# Patient Record
Sex: Male | Born: 1960 | Race: White | Hispanic: No | Marital: Married | Smoking: Former smoker
Health system: Southern US, Community
[De-identification: ages and names within clinical notes are randomized; demographics above are authoritative.]

## PROBLEM LIST (undated history)

## (undated) DIAGNOSIS — E785 Hyperlipidemia, unspecified: Secondary | ICD-10-CM

## (undated) DIAGNOSIS — H269 Unspecified cataract: Secondary | ICD-10-CM

## (undated) DIAGNOSIS — I2510 Atherosclerotic heart disease of native coronary artery without angina pectoris: Secondary | ICD-10-CM

## (undated) DIAGNOSIS — R918 Other nonspecific abnormal finding of lung field: Secondary | ICD-10-CM

## (undated) DIAGNOSIS — M1990 Unspecified osteoarthritis, unspecified site: Secondary | ICD-10-CM

## (undated) DIAGNOSIS — R001 Bradycardia, unspecified: Secondary | ICD-10-CM

## (undated) DIAGNOSIS — T7840XA Allergy, unspecified, initial encounter: Secondary | ICD-10-CM

## (undated) DIAGNOSIS — K219 Gastro-esophageal reflux disease without esophagitis: Secondary | ICD-10-CM

## (undated) DIAGNOSIS — G709 Myoneural disorder, unspecified: Secondary | ICD-10-CM

## (undated) HISTORY — DX: Atherosclerotic heart disease of native coronary artery without angina pectoris: I25.10

## (undated) HISTORY — DX: Unspecified osteoarthritis, unspecified site: M19.90

## (undated) HISTORY — DX: Gastro-esophageal reflux disease without esophagitis: K21.9

## (undated) HISTORY — DX: Bradycardia, unspecified: R00.1

## (undated) HISTORY — DX: Myoneural disorder, unspecified: G70.9

## (undated) HISTORY — DX: Other nonspecific abnormal finding of lung field: R91.8

## (undated) HISTORY — DX: Hyperlipidemia, unspecified: E78.5

## (undated) HISTORY — DX: Unspecified cataract: H26.9

## (undated) HISTORY — DX: Allergy, unspecified, initial encounter: T78.40XA

## (undated) HISTORY — PX: VASECTOMY: SHX75

## (undated) HISTORY — PX: COLONOSCOPY: SHX174

---

## 1995-06-02 HISTORY — PX: LUMBAR LAMINECTOMY: SHX95

## 1997-11-16 ENCOUNTER — Ambulatory Visit (HOSPITAL_COMMUNITY): Admission: RE | Admit: 1997-11-16 | Discharge: 1997-11-16 | Payer: Self-pay | Admitting: *Deleted

## 1998-01-11 ENCOUNTER — Emergency Department (HOSPITAL_COMMUNITY): Admission: EM | Admit: 1998-01-11 | Discharge: 1998-01-11 | Payer: Self-pay | Admitting: Emergency Medicine

## 1999-07-09 ENCOUNTER — Encounter: Admission: RE | Admit: 1999-07-09 | Discharge: 1999-07-09 | Payer: Self-pay | Admitting: Internal Medicine

## 1999-07-09 ENCOUNTER — Encounter: Payer: Self-pay | Admitting: Internal Medicine

## 1999-11-15 ENCOUNTER — Emergency Department (HOSPITAL_COMMUNITY): Admission: EM | Admit: 1999-11-15 | Discharge: 1999-11-15 | Payer: Self-pay | Admitting: Emergency Medicine

## 2000-02-14 ENCOUNTER — Encounter: Payer: Self-pay | Admitting: Internal Medicine

## 2000-02-14 ENCOUNTER — Ambulatory Visit (HOSPITAL_COMMUNITY): Admission: RE | Admit: 2000-02-14 | Discharge: 2000-02-14 | Payer: Self-pay | Admitting: Internal Medicine

## 2000-03-22 ENCOUNTER — Encounter: Payer: Self-pay | Admitting: Gastroenterology

## 2000-03-22 ENCOUNTER — Encounter: Admission: RE | Admit: 2000-03-22 | Discharge: 2000-03-22 | Payer: Self-pay | Admitting: Gastroenterology

## 2000-05-24 ENCOUNTER — Ambulatory Visit (HOSPITAL_COMMUNITY): Admission: RE | Admit: 2000-05-24 | Discharge: 2000-05-24 | Payer: Self-pay | Admitting: Gastroenterology

## 2005-09-16 ENCOUNTER — Ambulatory Visit: Payer: Self-pay | Admitting: Family Medicine

## 2005-09-23 ENCOUNTER — Ambulatory Visit: Payer: Self-pay | Admitting: Family Medicine

## 2005-10-20 ENCOUNTER — Ambulatory Visit: Payer: Self-pay | Admitting: Gastroenterology

## 2005-10-27 ENCOUNTER — Encounter (INDEPENDENT_AMBULATORY_CARE_PROVIDER_SITE_OTHER): Payer: Self-pay | Admitting: *Deleted

## 2005-10-27 ENCOUNTER — Ambulatory Visit: Payer: Self-pay | Admitting: Gastroenterology

## 2005-10-27 DIAGNOSIS — D126 Benign neoplasm of colon, unspecified: Secondary | ICD-10-CM | POA: Insufficient documentation

## 2005-10-27 DIAGNOSIS — Z8719 Personal history of other diseases of the digestive system: Secondary | ICD-10-CM | POA: Insufficient documentation

## 2007-03-01 ENCOUNTER — Ambulatory Visit: Payer: Self-pay | Admitting: Family Medicine

## 2007-03-01 DIAGNOSIS — K222 Esophageal obstruction: Secondary | ICD-10-CM | POA: Insufficient documentation

## 2007-03-01 DIAGNOSIS — J309 Allergic rhinitis, unspecified: Secondary | ICD-10-CM | POA: Insufficient documentation

## 2007-06-02 HISTORY — PX: INGUINAL HERNIA REPAIR: SUR1180

## 2007-08-29 ENCOUNTER — Ambulatory Visit: Payer: Self-pay | Admitting: Gastroenterology

## 2007-08-29 LAB — CONVERTED CEMR LAB
ALT: 33 units/L (ref 0–53)
AST: 25 units/L (ref 0–37)
Albumin: 4.2 g/dL (ref 3.5–5.2)
Alkaline Phosphatase: 68 units/L (ref 39–117)
BUN: 8 mg/dL (ref 6–23)
Basophils Absolute: 0 10*3/uL (ref 0.0–0.1)
Basophils Relative: 0.3 % (ref 0.0–1.0)
Bilirubin, Direct: 0.1 mg/dL (ref 0.0–0.3)
CO2: 33 meq/L — ABNORMAL HIGH (ref 19–32)
Calcium: 9.3 mg/dL (ref 8.4–10.5)
Chloride: 105 meq/L (ref 96–112)
Creatinine, Ser: 0.9 mg/dL (ref 0.4–1.5)
Eosinophils Absolute: 0.2 10*3/uL (ref 0.0–0.7)
Eosinophils Relative: 2.6 % (ref 0.0–5.0)
GFR calc Af Amer: 116 mL/min
GFR calc non Af Amer: 96 mL/min
Glucose, Bld: 99 mg/dL (ref 70–99)
HCT: 43.6 % (ref 39.0–52.0)
Hemoglobin: 14.7 g/dL (ref 13.0–17.0)
Lymphocytes Relative: 36.9 % (ref 12.0–46.0)
MCHC: 33.6 g/dL (ref 30.0–36.0)
MCV: 90.4 fL (ref 78.0–100.0)
Monocytes Absolute: 0.5 10*3/uL (ref 0.1–1.0)
Monocytes Relative: 6.8 % (ref 3.0–12.0)
Neutro Abs: 3.6 10*3/uL (ref 1.4–7.7)
Neutrophils Relative %: 53.4 % (ref 43.0–77.0)
Platelets: 304 10*3/uL (ref 150–400)
Potassium: 4.1 meq/L (ref 3.5–5.1)
RBC: 4.82 M/uL (ref 4.22–5.81)
RDW: 11.7 % (ref 11.5–14.6)
Sodium: 142 meq/L (ref 135–145)
TSH: 1.7 microintl units/mL (ref 0.35–5.50)
Total Bilirubin: 0.8 mg/dL (ref 0.3–1.2)
Total Protein: 7.3 g/dL (ref 6.0–8.3)
WBC: 6.8 10*3/uL (ref 4.5–10.5)

## 2007-08-30 ENCOUNTER — Ambulatory Visit: Payer: Self-pay | Admitting: Internal Medicine

## 2007-08-31 ENCOUNTER — Ambulatory Visit: Payer: Self-pay

## 2007-08-31 ENCOUNTER — Encounter: Payer: Self-pay | Admitting: Internal Medicine

## 2007-09-26 ENCOUNTER — Ambulatory Visit: Payer: Self-pay | Admitting: Family Medicine

## 2007-09-26 LAB — CONVERTED CEMR LAB
Bilirubin Urine: NEGATIVE
Blood in Urine, dipstick: NEGATIVE
Glucose, Urine, Semiquant: NEGATIVE
Ketones, urine, test strip: NEGATIVE
Nitrite: NEGATIVE
Protein, U semiquant: NEGATIVE
Specific Gravity, Urine: 1.025
Urobilinogen, UA: 0.2
WBC Urine, dipstick: NEGATIVE
pH: 6.5

## 2007-09-27 ENCOUNTER — Ambulatory Visit: Payer: Self-pay | Admitting: Family Medicine

## 2007-09-27 LAB — CONVERTED CEMR LAB
ALT: 28 units/L (ref 0–53)
AST: 23 units/L (ref 0–37)
Albumin: 4.3 g/dL (ref 3.5–5.2)
Alkaline Phosphatase: 63 units/L (ref 39–117)
BUN: 12 mg/dL (ref 6–23)
Basophils Absolute: 0 10*3/uL (ref 0.0–0.1)
Basophils Relative: 0.3 % (ref 0.0–1.0)
Bilirubin, Direct: 0.1 mg/dL (ref 0.0–0.3)
CO2: 31 meq/L (ref 19–32)
Calcium: 9.5 mg/dL (ref 8.4–10.5)
Chloride: 108 meq/L (ref 96–112)
Cholesterol: 210 mg/dL (ref 0–200)
Creatinine, Ser: 0.9 mg/dL (ref 0.4–1.5)
Direct LDL: 138.8 mg/dL
Eosinophils Absolute: 0.3 10*3/uL (ref 0.0–0.7)
Eosinophils Relative: 4.8 % (ref 0.0–5.0)
GFR calc Af Amer: 116 mL/min
GFR calc non Af Amer: 96 mL/min
Glucose, Bld: 79 mg/dL (ref 70–99)
HCT: 46.2 % (ref 39.0–52.0)
HDL: 39.7 mg/dL (ref >39.0)
Hemoglobin: 15.4 g/dL (ref 13.0–17.0)
Lymphocytes Relative: 30.5 % (ref 12.0–46.0)
MCHC: 33.2 g/dL (ref 30.0–36.0)
MCV: 90.9 fL (ref 78.0–100.0)
Monocytes Absolute: 0.4 10*3/uL (ref 0.1–1.0)
Monocytes Relative: 7.3 % (ref 3.0–12.0)
Neutro Abs: 3.4 10*3/uL (ref 1.4–7.7)
Neutrophils Relative %: 57.1 % (ref 43.0–77.0)
Platelets: 211 10*3/uL (ref 150–400)
Potassium: 4.1 meq/L (ref 3.5–5.1)
RBC: 5.08 M/uL (ref 4.22–5.81)
RDW: 11.8 % (ref 11.5–14.6)
Sodium: 145 meq/L (ref 135–145)
TSH: 0.84 microintl units/mL (ref 0.35–5.50)
Total Bilirubin: 0.7 mg/dL (ref 0.3–1.2)
Total CHOL/HDL Ratio: 5.3
Total Protein: 7.4 g/dL (ref 6.0–8.3)
Triglycerides: 103 mg/dL (ref 0–149)
VLDL: 21 mg/dL (ref 0–40)
WBC: 5.9 10*3/uL (ref 4.5–10.5)

## 2007-10-03 ENCOUNTER — Ambulatory Visit: Payer: Self-pay | Admitting: Family Medicine

## 2007-10-03 DIAGNOSIS — J45909 Unspecified asthma, uncomplicated: Secondary | ICD-10-CM | POA: Insufficient documentation

## 2007-10-03 DIAGNOSIS — K219 Gastro-esophageal reflux disease without esophagitis: Secondary | ICD-10-CM | POA: Insufficient documentation

## 2007-10-03 DIAGNOSIS — K439 Ventral hernia without obstruction or gangrene: Secondary | ICD-10-CM | POA: Insufficient documentation

## 2007-10-03 DIAGNOSIS — M545 Low back pain, unspecified: Secondary | ICD-10-CM | POA: Insufficient documentation

## 2007-10-11 ENCOUNTER — Ambulatory Visit: Payer: Self-pay | Admitting: Gastroenterology

## 2007-10-14 ENCOUNTER — Telehealth (INDEPENDENT_AMBULATORY_CARE_PROVIDER_SITE_OTHER): Payer: Self-pay | Admitting: *Deleted

## 2007-11-02 ENCOUNTER — Telehealth: Payer: Self-pay | Admitting: Gastroenterology

## 2007-12-01 ENCOUNTER — Encounter: Payer: Self-pay | Admitting: Gastroenterology

## 2008-01-05 ENCOUNTER — Telehealth: Payer: Self-pay | Admitting: Gastroenterology

## 2008-04-20 ENCOUNTER — Ambulatory Visit (HOSPITAL_BASED_OUTPATIENT_CLINIC_OR_DEPARTMENT_OTHER): Admission: RE | Admit: 2008-04-20 | Discharge: 2008-04-20 | Payer: Self-pay | Admitting: Surgery

## 2008-06-05 ENCOUNTER — Encounter: Payer: Self-pay | Admitting: Gastroenterology

## 2009-03-12 ENCOUNTER — Telehealth: Payer: Self-pay | Admitting: Family Medicine

## 2009-03-27 ENCOUNTER — Encounter: Payer: Self-pay | Admitting: Internal Medicine

## 2009-04-04 ENCOUNTER — Ambulatory Visit: Payer: Self-pay | Admitting: Internal Medicine

## 2009-04-04 DIAGNOSIS — R0789 Other chest pain: Secondary | ICD-10-CM | POA: Insufficient documentation

## 2009-04-19 ENCOUNTER — Ambulatory Visit: Payer: Self-pay | Admitting: Cardiology

## 2009-04-19 ENCOUNTER — Ambulatory Visit (HOSPITAL_COMMUNITY): Admission: RE | Admit: 2009-04-19 | Discharge: 2009-04-19 | Payer: Self-pay | Admitting: Internal Medicine

## 2009-04-19 ENCOUNTER — Ambulatory Visit: Payer: Self-pay

## 2009-04-19 ENCOUNTER — Ambulatory Visit: Payer: Self-pay | Admitting: Internal Medicine

## 2009-04-19 ENCOUNTER — Encounter: Payer: Self-pay | Admitting: Internal Medicine

## 2009-04-19 DIAGNOSIS — R002 Palpitations: Secondary | ICD-10-CM | POA: Insufficient documentation

## 2009-04-23 LAB — CONVERTED CEMR LAB: TSH: 1.22 microintl units/mL (ref 0.35–5.50)

## 2010-01-30 ENCOUNTER — Telehealth: Payer: Self-pay | Admitting: Internal Medicine

## 2010-01-30 HISTORY — PX: NECK SURGERY: SHX720

## 2010-07-01 NOTE — Progress Notes (Signed)
Summary: RX CALLED IN  Phone Note Call from Patient Call back at Home Phone [phone]   Caller: Patient Call For: Kadeisha Betsch Reason for Call: Talk to Nurse Summary of Call: WAS GIVENM SAMPLES OF NEXIUM TO TRY AND IF THEY WORK OT CALL us BACK TO GET AND RX .Marland KitchenMarland KitchenPT STATES THAT THEY WORK AND WOULD LIKE AN RX CALLED IN TO HIS PHARMACY [place]  Initial call taken by: Tawni Levy,  November 02, 2007 10:40 AM  Follow-up for Phone Call        rx called in Follow-up by: Chales Abrahams CMA,  November 02, 2007 10:54 AM    New/Updated Medications: NEXIUM 40 MG  CPDR (ESOMEPRAZOLE MAGNESIUM) 1 once daily 20-30 min before breakfast   Prescriptions: NEXIUM 40 MG  CPDR (ESOMEPRAZOLE MAGNESIUM) 1 once daily 20-30 min before breakfast  #30 x 3   Entered by:   Chales Abrahams CMA   Authorized by:   Rachael Fee MD   Signed by:   Chales Abrahams CMA on 11/02/2007   Method used:   Electronically sent to ...       [facility]*       [address]       [place], [place]  [address]       Ph: [phone]       Fax: [phone]   RxID:   [id number]

## 2010-07-01 NOTE — Progress Notes (Signed)
Summary: refill  Phone Note Call from Patient Call back at [phone]   Caller: Patient Call For: Fenix Ruppe Reason for Call: Talk to Nurse Details for Reason: refill Summary of Call: [facility] states they have NOT received any electronic request on refill for nexium -advised pt it was sent on 8-4  Follow-up for Phone Call        pharmacy called and notified of rx refill electronically sent on 01/03/08 Follow-up by: Chales Abrahams CMA,  January 06, 2008 8:21 AM

## 2010-07-01 NOTE — Assessment & Plan Note (Signed)
Summary: RENEW MEDS/CCM   Vital Signs:  Patient Profile:   50 Years Old Male Weight:      184 pounds (83.64 kg) Temp:     98.2 degrees F (36.78 degrees C) oral BP sitting:   140 / 78  (right arm)  Pt. in pain?   no  Vitals Entered By: Arcola Jansky, RN (March 01, 2007 10:06 AM)                  Chief Complaint:  "RF MEDS, EYE STRAIN , FEELING RUN DOWN, and CHRONIC BACK PAIN".  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  As his problem using the fall.  A. congestion, fatigue tired, no energy.  He has no other symptoms.  We saw her about a year ago.  He had trouble swallowing.  It turned out on endoscopy by Dr. Christella Hartigan.  He had a stricture lower esophageal sphincter.  There was dilated.  He's not been on a PPI since then.  He says swallowing mechanism is normal.  Acute Visit History:      He denies abdominal pain, chest pain, constipation, cough, diarrhea, earache, eye symptoms, fever, genitourinary symptoms, headache, musculoskeletal symptoms, nasal discharge, nausea, rash, sinus problems, sore throat, and vomiting.         Current Allergies: ! * LATEX    Risk Factors:  Tobacco use:  never Alcohol use:  no   Review of Systems      See HPI   Physical Exam  General:     Well-developed,well-nourished,in no acute distress; alert,appropriate and cooperative throughout examination Head:     Normocephalic and atraumatic without obvious abnormalities. No apparent alopecia or balding. Eyes:     No corneal or conjunctival inflammation noted. EOMI. Perrla. Funduscopic exam benign, without hemorrhages, exudates or papilledema. Vision grossly normal. Ears:     External ear exam shows no significant lesions or deformities.  Otoscopic examination reveals clear canals, tympanic membranes are intact bilaterally without bulging, retraction, inflammation or discharge. Hearing is grossly normal  bilaterally. Nose:     External nasal examination shows no deformity or inflammation. Nasal mucosa are pink and moist without lesions or exudates. Mouth:     Oral mucosa and oropharynx without lesions or exudates.  Teeth in good repair. Neck:     No deformities, masses, or tenderness noted. Heart:     120/80    Impression & Recommendations:  Problem # 1:  RHINITIS, ALLERGIC NOS (ICD-477.9) Assessment: Deteriorated  His updated medication list for this problem includes:    Flonase 50 Mcg/act Susp (Fluticasone propionate)    Beconase Aq 42 Mcg/spray Susp (Beclomethasone diprop monohyd)   Problem # 2:  ESOPHAGEAL STRICTURE (ICD-530.3) Assessment: Unchanged  Complete Medication List: 1)  Intal  2)  Flonase 50 Mcg/act Susp (Fluticasone propionate) 3)  Hydrocodone-acetaminophen 5-500 Mg Tabs (Hydrocodone-acetaminophen) 4)  Beconase Aq 42 Mcg/spray Susp (Beclomethasone diprop monohyd) 5)  Albuterol 90 Mcg/act Aers (Albuterol) 6)  Ventolin Hfa 108 (90 Base) Mcg/act Aers (Albuterol sulfate)   Patient Instructions: 1)  I would take the over-the-counter 10-mg Claritin tablet in the morning to help with your allergy symptoms.  If this is not work after two to 3 weeks with Z. the steroid nasal spray.  I would also take a 20-mg Prilosec tablet every morning.  Because of your history of esophageal reflux and the stricture that had to be dilated in 2007.  Be  conscious of her swallowing mechanism.  It did begin to have problems in need to call Dr. Christella Hartigan immediately for reevaluation.  Set up in April 2009 for physical exam. 2)  cbc, lipid panel, u/a ,tsh level, liver fcn panel,bmet, and psa prior to next visit (v70.0)     ]

## 2010-07-01 NOTE — Consult Note (Signed)
Summary: [facility]   Imported By: Esmeralda Links D'jimraou 12/29/2007 13:25:21  _____________________________________________________________________  External Attachment:    Type:   Image     Comment:   External Document

## 2010-07-01 NOTE — Assessment & Plan Note (Signed)
Summary: cpx--smm   Vital Signs:  Patient Profile:   50 Years Old Male Height:     70 inches Weight:      188 pounds Temp:     98.3 degrees F oral BP sitting:   124 / 86  (left arm)  Pt. in pain?   no  Vitals Entered By: Doristine Devoid (Oct 03, 2007 10:38 AM)                  Chief Complaint:  cpx.  History of Present Illness: Jake [name] is a 50 year old, married male, who comes in today for evaluation of allergic rhinitis, asthma, reflux, esophagitis,  The allergic rhinitis is treated with over-the-counter antihistamines.  And Beconase HQ nasal spray.  The asthma is a chronic condition and he should be using Flovent one puff b.i.d.  Advised for him to do this on a daily basis.  The reflux esophagitis was evaluated by Dr. Christella Hartigan.  He advised cardiac eval.  He went to see Dr. Sharrell Ku, who did a complete evaluation, and it was all normal.  He is going back to see Dr. Christella Hartigan for reflexes isolation.  He takes Prilosec 20 mg two tablets before breakfast.  Advise split the dose and take one before breakfast and one before his p.m. meal.  He also complains of a slight ventral hernia.  He says his been present for quite, awhile, it's asymptomatic.    Current Allergies: ! * LATEX  Past Medical History:    Reviewed history and no changes required:       Allergic rhinitis       Asthma       GERD       Low back pain   Family History:    Reviewed history and no changes required:       Family History of Alcoholism/Addiction       Family History of Asthma       Family History Depression  Social History:    Reviewed history and no changes required:       Occupation:       Married       Never Smoked       Alcohol use-no       Drug use-no       Regular exercise-yes   Risk Factors:  Tobacco use:  never Drug use:  no Alcohol use:  no Exercise:  yes   Review of Systems      See HPI   Physical Exam  General:     Well-developed,well-nourished,in no acute distress;  alert,appropriate and cooperative throughout examination Head:     Normocephalic and atraumatic without obvious abnormalities. No apparent alopecia or balding. Eyes:     No corneal or conjunctival inflammation noted. EOMI. Perrla. Funduscopic exam benign, without hemorrhages, exudates or papilledema. Vision grossly normal. Ears:     External ear exam shows no significant lesions or deformities.  Otoscopic examination reveals clear canals, tympanic membranes are intact bilaterally without bulging, retraction, inflammation or discharge. Hearing is grossly normal bilaterally. Nose:     External nasal examination shows no deformity or inflammation. Nasal mucosa are pink and moist without lesions or exudates. Mouth:     Oral mucosa and oropharynx without lesions or exudates.  Teeth in good repair. Neck:     No deformities, masses, or tenderness noted. Chest Wall:     No deformities, masses, tenderness or gynecomastia noted. Breasts:     No masses or gynecomastia noted Lungs:  Normal respiratory effort, chest expands symmetrically. Lungs are clear to auscultation, no crackles or wheezes. Heart:     Normal rate and regular rhythm. S1 and S2 normal without gallop, murmur, click, rub or other extra sounds. Abdomen:     there is a 3-inch ventral hernia from the lower sternum to halfway to the umbilicus.  Otherwise abdominal exam negative Rectal:     No external abnormalities noted. Normal sphincter tone. No rectal masses or tenderness. Genitalia:     Testes bilaterally descended without nodularity, tenderness or masses. No scrotal masses or lesions. No penis lesions or urethral discharge. Prostate:     Prostate gland firm and smooth, no enlargement, nodularity, tenderness, mass, asymmetry or induration. Msk:     No deformity or scoliosis noted of thoracic or lumbar spine.   Pulses:     R and L carotid,radial,femoral,dorsalis pedis and posterior tibial pulses are full and equal  bilaterally Extremities:     No clubbing, cyanosis, edema, or deformity noted with normal full range of motion of all joints.   Neurologic:     No cranial nerve deficits noted. Station and gait are normal. Plantar reflexes are down-going bilaterally. DTRs are symmetrical throughout. Sensory, motor and coordinative functions appear intact. Skin:     Intact without suspicious lesions or rashes Cervical Nodes:     No lymphadenopathy noted Axillary Nodes:     No palpable lymphadenopathy Inguinal Nodes:     No significant adenopathy Psych:     Cognition and judgment appear intact. Alert and cooperative with normal attention span and concentration. No apparent delusions, illusions, hallucinations    Impression & Recommendations:  Problem # 1:  ALLERGIC RHINITIS (ICD-477.9) Assessment: Improved  The following medications were removed from the medication list:    Flonase 50 Mcg/act Susp (Fluticasone propionate)  His updated medication list for this problem includes:    Beconase Aq 42 Mcg/spray Susp (Beclomethasone diprop monohyd) .Marland Kitchen... As needed   Problem # 2:  ASTHMA (ICD-493.90) Assessment: Improved  The following medications were removed from the medication list:    Albuterol 90 Mcg/act Aers (Albuterol)  His updated medication list for this problem includes:    Ventolin Hfa 108 (90 Base) Mcg/act Aers (Albuterol sulfate) .Marland Kitchen... As needed    Flovent Hfa 44 Mcg/act Aero (Fluticasone propionate  hfa) .Marland Kitchen... 1 p two times a day   Problem # 3:  GERD (ICD-530.81) Assessment: Unchanged  Problem # 4:  VENTRAL HERNIA, SMALL (ICD-553.20) Assessment: New  Complete Medication List: 1)  Intal  .... As needed 2)  Hydrocodone-acetaminophen 5-500 Mg Tabs (Hydrocodone-acetaminophen) 3)  Beconase Aq 42 Mcg/spray Susp (Beclomethasone diprop monohyd) .... As needed 4)  Ventolin Hfa 108 (90 Base) Mcg/act Aers (Albuterol sulfate) .... As needed 5)  Flovent Hfa 44 Mcg/act Aero (Fluticasone  propionate  hfa) .Marland Kitchen.. 1 p two times a day   Patient Instructions: 1)  Please schedule a follow-up appointment in 1 year. 2)  Take an Aspirin every day 3)  I would take one puff of the Flovent twice a day.  All year round.    Prescriptions: FLOVENT HFA 44 MCG/ACT  AERO (FLUTICASONE PROPIONATE  HFA) 1 p two times a day  #3 x 4   Entered and Authorized by:   Roderick Pee MD   Signed by:   Roderick Pee MD on 10/03/2007   Method used:   Electronically sent to ...       [facility]*  [address]       [place], [place]  [address]       Ph: [phone] or [phone]       Fax: [phone]   RxID:   [id number] VENTOLIN HFA 108 (90 BASE) MCG/ACT  AERS (ALBUTEROL SULFATE) as needed  #2 x 1   Entered and Authorized by:   Roderick Pee MD   Signed by:   Roderick Pee MD on 10/03/2007   Method used:   Electronically sent to ...       [facility]*       [address]       [place], [place]  [address]       Ph: [phone] or [phone]       Fax: [phone]   RxID:   [id number] BECONASE AQ 42 MCG/SPRAY  SUSP (BECLOMETHASONE DIPROP MONOHYD) as needed  #3 x 4   Entered and Authorized by:   Roderick Pee MD   Signed by:   Roderick Pee MD on 10/03/2007   Method used:   Electronically sent to ...       [facility]*       [address]       [place], [place]  [address]       Ph: [phone] or [phone]       Fax: [phone]   RxID:   [id number] INTAL as needed  #2 x 3   Entered and Authorized by:   Roderick Pee MD   Signed by:   Roderick Pee MD on 10/03/2007   Method used:   Electronically sent to ...       [facility]*       [address]       [place], [place]  [address]       Ph: [phone] or [phone]       Fax: [phone]   RxID:   [id number]  ]

## 2010-07-01 NOTE — Progress Notes (Signed)
Summary: pt req refill of Flovent  Phone Note Call from Patient Call back at [facility] Phone [phone]   Caller: Patient Summary of Call: Pt called and is wanted to know status of refill of Flovent. Pt needs this done today.  Initial call taken by: Lucy Antigua,  March 12, 2009 11:24 AM    Prescriptions: FLOVENT HFA 44 MCG/ACT  AERO (FLUTICASONE PROPIONATE  HFA) 1 p two times a day  #1 x 0   Entered and Authorized by:   Roderick Pee MD   Signed by:   Roderick Pee MD on 03/12/2009   Method used:   Electronically to        [facility]* (retail)       [address]       [place], [place]  [address]       Ph: [phone]       Fax: [phone]   RxID:   [id number] VENTOLIN HFA 108 (90 BASE) MCG/ACT  AERS (ALBUTEROL SULFATE) as needed  #1 x 0   Entered and Authorized by:   Roderick Pee MD   Signed by:   Roderick Pee MD on 03/12/2009   Method used:   Electronically to        [facility]* (retail)       [address]       [place], [place]  [address]       Ph: [phone]       Fax: [phone]   RxID:   [id number]

## 2010-07-01 NOTE — Procedures (Signed)
Summary: Gastroenterology EGD  Gastroenterology EGD   Imported By: Thereasa Solo 10/08/2007 11:16:39  _____________________________________________________________________  External Attachment:    Type:   Image     Comment:   External Document

## 2010-07-01 NOTE — Assessment & Plan Note (Signed)
History of Present Illness Visit Type: follow up Primary GI MD: Rob Bunting MD Primary MD: Kelle Darting Chief Complaint: epigastric discomfort, hernia History of Present Illness:  very pleasant 50 year old man whom I last saw 6 or 8 weeks ago. She was having intermittent chest and epigastric discomfort. I want to make sure that this was not cardiac cause and so I sent him to cardiology for evaluation. He has stress tests and other testing done tells me that he has clear bill of health from his cardiologist. He change the way taking his Prilosec so that he takes one pill 20- 30 minutes before breakfast and one pill 20-30 minutes prior to dinner. He does on this regimen he still bothered by intermittent substernal spasms and mild pyrosis. He has no dysphasia.  Complained of a new discomfort today. Specifically he has pain in his midline abdomen with Valsalva. He was told he has a ventral hernia. This is not a severe pain when it occurs but it is bothersome enough and keeps him from snowboarding sometimes.   GI Review of Systems    Reports abdominal pain and  acid reflux.     Denies dysphagia with liquids, dysphagia with solids, and  weight loss.         Prior Medications Reviewed Using: Patient Recall  Updated Prior Medication List: * INTAL as needed HYDROCODONE-ACETAMINOPHEN 5-500 MG  TABS (HYDROCODONE-ACETAMINOPHEN)  BECONASE AQ 42 MCG/SPRAY  SUSP (BECLOMETHASONE DIPROP MONOHYD) as needed VENTOLIN HFA 108 (90 BASE) MCG/ACT  AERS (ALBUTEROL SULFATE) as needed FLOVENT HFA 44 MCG/ACT  AERO (FLUTICASONE PROPIONATE  HFA) 1 p two times a day NABUMETONE 500 MG  TABS (NABUMETONE) prn PRILOSEC OTC 20 MG  TBEC (OMEPRAZOLE MAGNESIUM) 1 each day 30 minutes before meal bid  Current Allergies: ! * LATEX      Vital Signs:  Patient Profile:   50 Years Old Male CC:      Consult egd/ cardiac patient Height:     70 inches Weight:      188 pounds BMI:     27.07 Pulse rate:   68 /  minute BP sitting:   120 / 80  (left arm)                  Physical Exam  Constitutional: generally well appearing Psychiatric: alert and oriented times 3 Abdomen: soft, non-tender, non-distended, normal bowel sounds, slight midline bulge with valsalva     Impression & Recommendations:  Problem # 1:  VENTRAL HERNIA, SMALL (ICD-553.20) small ventral hernia but seems to be symptomatic. I'll refer him to Dr. Wenda Low at [facility] surgery for evaluation and recommendation.  Problem # 2:  GERD (ICD-530.81) his intermittent esophageal spasms may be from GERD. He did have a Schatzki's ring seen in 2007 but he has no dysphasia and so I do not think this is symptomatic again. Looking at the pictures from his EGD previously I wonder if this was actually peptic stricturing rather than a thick Schatzki's ring. For now recommended that he stop taking the over-the-counter Prilosec and instead begin taking Nexium 20 to 30s prior to his breakfast meal on a daily basis. He will call my office in 4-6 weeks to let me know if it is working effectively if it isl we will call in a prescription for the Nexium.  Medications Added to Medication List This Visit: 1)  Nabumetone 500 Mg Tabs (Nabumetone) .... Prn 2)  Prilosec Otc 20 Mg Tbec (Omeprazole magnesium) .Marland KitchenMarland KitchenMarland Kitchen 1  each day 30 minutes before meal bid   Patient Instructions: 1)  Stop Prilosec.  Start Nexium 20-30 minutes prior to meal. 2)  Referral to [facility]    ]  Appended Document: update orders         Current Allergies: ! * LATEX             ]

## 2010-07-01 NOTE — Progress Notes (Signed)
Summary: pt needs some information for surgery  Phone Note Call from Patient Call back at [phone]   Caller: Patient Reason for Call: Talk to Nurse, Talk to Doctor Summary of Call: pt is having Neuro surgery in about 3wks and needs some results of some test he had done in order to finish his medical history Initial call taken by: Omer Jack,  January 30, 2010 2:07 PM  Follow-up for Phone Call        mailed stees echo , office note, EKG and echo from 2009 to pt home address Dennis Bast, RN, BSN  January 30, 2010 2:32 PM

## 2010-07-01 NOTE — Progress Notes (Signed)
  Phone Note Outgoing Call Call back at [facility] [phone] Call back at Work Phone [phone]   Call placed by: Chales Abrahams CMA,  Oct 14, 2007 9:44 AM Call placed to: Patient Action Taken: Appt scheduled Details for Reason: Need to inform pt of [facility] appt with Dr Daphine Deutscher 11:05 am pt also needs to arrive 20 minutes early. Summary of Call: Inform pt of [facility] appt  Follow-up for Phone Call        pt returned call and informed of appt date and time and to arrive 20 minutes early. Pt verbalized understanding. Follow-up by: Chales Abrahams CMA,  Oct 14, 2007 10:08 AM

## 2010-07-01 NOTE — Miscellaneous (Signed)
Summary: nexium refill  Clinical Lists Changes  Medications: Changed medication from NEXIUM 40 MG  CPDR (ESOMEPRAZOLE MAGNESIUM) 1 once daily 20-30 min before breakfast to NEXIUM 40 MG  CPDR (ESOMEPRAZOLE MAGNESIUM) 1 once daily 20-30 min before breakfast - Signed Rx of NEXIUM 40 MG  CPDR (ESOMEPRAZOLE MAGNESIUM) 1 once daily 20-30 min before breakfast;  #30 x 3;  Signed;  Entered by: Chales Abrahams CMA;  Authorized by: Rachael Fee MD;  Method used: Electronically to [facility]*, [address], [place], [place]  [address], Ph: [phone], Fax: [phone]    Prescriptions: NEXIUM 40 MG  CPDR (ESOMEPRAZOLE MAGNESIUM) 1 once daily 20-30 min before breakfast  #30 x 3   Entered by:   Chales Abrahams CMA   Authorized by:   Rachael Fee MD   Signed by:   Chales Abrahams CMA on 06/05/2008   Method used:   Electronically to        [facility]* (retail)       [address]       [place], [place]  [address]       Ph: [phone]       Fax: [phone]   RxID:   [id number]

## 2010-07-01 NOTE — Assessment & Plan Note (Signed)
Summary: f1y per pt call/lg   Primary Provider:  Kelle Darting  CC:  CP; SOB; fatigue.  History of Present Illness: Mr. Offerdahl returns today for followup.  I saw him initially almost two years ago and at that time he had c/p and had a regular stress test and a 2-D echo.  He did well but over the last month has had chest pressure and fatigue.  He was diagnosed with pneumonia vs bronchitis at [facility] urgent care.  He has been on anti-biotics.  He denies syncope and peripheral edema.  Current Medications (verified): 1)  Intal .... As Needed 2)  Beconase Aq 42 Mcg/spray  Susp (Beclomethasone Diprop Monohyd) .... As Needed 3)  Ventolin Hfa 108 (90 Base) Mcg/act  Aers (Albuterol Sulfate) .... As Needed 4)  Flovent Hfa 44 Mcg/act  Aero (Fluticasone Propionate  Hfa) .Marland Kitchen.. 1 P Two Times A Day 5)  Nexium 40 Mg  Cpdr (Esomeprazole Magnesium) .Marland Kitchen.. 1 Once Daily 20-30 Min Before Breakfast  Allergies (verified): 1)  ! * Latex  Past History:  Past Medical History: Last updated: 10/03/2007 Allergic rhinitis Asthma GERD Low back pain  Past Surgical History: Last updated: 10/08/2007 LUMBAR LAMINECTOMY (1997)  Review of Systems       The patient complains of chest pain.  The patient denies syncope, dyspnea on exertion, and peripheral edema.    Vital Signs:  Patient profile:   50 year old male Height:      70 inches Weight:      184 pounds BMI:     26.50 Pulse rate:   70 / minute BP sitting:   114 / 86  (left arm) Cuff size:   regular  Vitals Entered By: Hardin Negus, RMA (April 04, 2009 4:27 PM)  Physical Exam  General:  Well developed, well nourished, in no acute distress.  HEENT: normal Neck: supple. No JVD. Carotids 2+ bilaterally no bruits Cor: RRR no rubs, gallops or murmur Lungs: CTA Ab: soft, nontender. nondistended. No HSM. Good bowel sounds Ext: warm. no cyanosis, clubbing or edema Neuro: alert and oriented. Grossly nonfocal. affect pleasant    Impression &  Recommendations:  Problem # 1:  CHEST DISCOMFORT (ICD-786.59) The etiology of his symptoms is unclear. In addition to chest pressure, he's had this generalized fatigue and weakness.  I TSH will be drawn.  His CBC does not suggest any anemia with a Hgb of 15.  While a cardiac cath is an option, I think it is unlikely that he will have obstructive disease and a stress echo will give [place] some functional information. Orders: Stress Echo (Stress Echo)  Problem # 2:  CHEST DISCOMFORT (YNW-295.62)  Patient Instructions: 1)  Your physician recommends that you schedule a follow-up appointment depends on results of stress echo 2)  Your physician has requested that you have a stress echocardiogram. For further information please visit [email or web address]  Please follow instruction sheet as given. 3)  Your physician recommends that you return for lab work sameday as echo will need a TSH

## 2010-07-01 NOTE — Procedures (Signed)
Summary: Gastroenterology [facility]  Gastroenterology [facility]   Imported By: Thereasa Solo 10/08/2007 11:15:51  _____________________________________________________________________  External Attachment:    Type:   Image     Comment:   External Document

## 2010-07-04 ENCOUNTER — Other Ambulatory Visit: Payer: Self-pay | Admitting: Orthopedic Surgery

## 2010-07-04 ENCOUNTER — Ambulatory Visit (HOSPITAL_BASED_OUTPATIENT_CLINIC_OR_DEPARTMENT_OTHER)
Admission: RE | Admit: 2010-07-04 | Discharge: 2010-07-04 | Disposition: A | Discharge status: Home / Self Care | Payer: BC Managed Care – PPO | Source: Ambulatory Visit | Attending: Orthopedic Surgery | Admitting: Orthopedic Surgery

## 2010-07-04 DIAGNOSIS — B079 Viral wart, unspecified: Secondary | ICD-10-CM | POA: Insufficient documentation

## 2010-07-17 NOTE — Op Note (Signed)
Jake [name], SHIPPER NO.:  [id number]  MEDICAL RECORD NO.:  [id number]          PATIENT TYPE:  LOCATION:                                 FACILITY:  PHYSICIAN:  Katy Fitch. Dywane Peruski, M.D.      DATE OF BIRTH:  DATE OF PROCEDURE:  07/04/2010 DATE OF DISCHARGE:                              OPERATIVE REPORT   PREOPERATIVE DIAGNOSES:  Uncomfortable verrucous mass, left ring finger, proximal interphalangeal flexion creases, volar surface of the glabrous skin measuring 5 x 4 mm with failed prior medical management using salicylic acid.  POSTOPERATIVE DIAGNOSES:  Uncomfortable verrucous mass, left ring finger, proximal interphalangeal flexion creases, volar surface of the glabrous skin measuring 5 x 4 mm with failed prior medical management using salicylic acid.  OPERATION:  Full-thickness skin biopsy of verrucous mass, right ring finger for diagnosis and hopeful resolution.  The excision was also augmented by a subcutaneous infiltration of 2% lidocaine in an effort to sensitize the skin in this region and decrease the risk of recurrence.  SURGEON:  Katy Fitch. Ymani Porcher, MD  ASSISTANT:  Marveen Reeks Dasnoit, PA-C  ANESTHESIA:  2% lidocaine, metacarpal head level block and subverrucous infiltration.  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  He developed a painful verrucous mass on the volar aspect of his left ring finger PIP flexion creases.  He is treated this with compound W and other salicylic acid preparations without success.  He was referred for definitive excision.  We will biopsy this to be certain this is a verrucous lesion. Mr. 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  He was advised that we would accomplish surgery in a latex-free environment.  After informed consent, he was brought to the operating room at  this time.  PROCEDURE IN DETAILS:  Fenton Candee. Kann was brought to the minor operating room of [facility], placed supine position upon the operating room table.  After Betadine prep and informed consent, 2% lidocaine was infiltrated at metacarpal head level to obtain a digital block.  The left hand and arm were then prepped with Betadine soap solution, sterilely draped with sterile towels.  The left ring finger was exsanguinated with a gauze wrap and a 1/2-inch Penrose drain was placed in the base of the proximal phalanx as a digital tourniquet.  A routine surgical time-out was accomplished.  Procedure commenced with an elliptical excision of skin with a 1 to 1.5-mm margin of normal- appearing skin.  This was taken full-thickness down to the level of subcutaneous fat and veins.  Specimen was placed in formalin and passed off for pathologic evaluation.  The wound was then repaired with simple suture of 5-0 nylon x2.  Lidocaine 2% was infiltrated at the base of the wound and the skin margins, pierced with a needle several times in an effort to stimulate as immune response.  This has been demonstrated in our literature to improve the resolution of wart-like lesions.  The finger was then dressed with Lyda Perone, sterile  gauze and Coban.  Mr. Vaden was provided prescription for tramadol 50 mg 1 p.o. q.4-6 h. p.r.n. pain 20 tablets without refill for postop analgesia.  He will elevate his hand for the next 24-48 hours.  He will see me back for followup in a week for suture removal.  He will take off his dressing and begin using Band-Aid in 48 hours.     Katy Fitch Lavra Imler, M.D.     RVS/MEDQ  D:  07/04/2010  T:  07/05/2010  Job:  [id number]  Electronically Signed by Josephine Igo M.D. on 07/17/2010 08:42:09 AM

## 2010-08-08 ENCOUNTER — Other Ambulatory Visit: Payer: BC Managed Care – PPO

## 2010-08-08 DIAGNOSIS — Z0000 Encounter for general adult medical examination without abnormal findings: Secondary | ICD-10-CM

## 2010-08-08 LAB — HEPATIC FUNCTION PANEL
ALT: 19 U/L (ref 0–53)
AST: 13 U/L (ref 0–37)
Albumin: 4.3 g/dL (ref 3.5–5.2)
Alkaline Phosphatase: 63 U/L (ref 39–117)
Bilirubin, Direct: 0.2 mg/dL (ref 0.0–0.3)
Total Bilirubin: 0.6 mg/dL (ref 0.3–1.2)
Total Protein: 7 g/dL (ref 6.0–8.3)

## 2010-08-08 LAB — CBC WITH DIFFERENTIAL/PLATELET
Basophils Absolute: 0 10*3/uL (ref 0.0–0.1)
Basophils Relative: 0.1 % (ref 0.0–3.0)
Eosinophils Absolute: 0 10*3/uL (ref 0.0–0.7)
Eosinophils Relative: 0 % (ref 0.0–5.0)
HCT: 41.9 % (ref 39.0–52.0)
Hemoglobin: 14.4 g/dL (ref 13.0–17.0)
Lymphocytes Relative: 16.2 % (ref 12.0–46.0)
Lymphs Abs: 1.9 10*3/uL (ref 0.7–4.0)
MCHC: 34.2 g/dL (ref 30.0–36.0)
MCV: 92.2 fl (ref 78.0–100.0)
Monocytes Absolute: 0.7 10*3/uL (ref 0.1–1.0)
Monocytes Relative: 6.1 % (ref 3.0–12.0)
Neutro Abs: 9.2 10*3/uL — ABNORMAL HIGH (ref 1.4–7.7)
Neutrophils Relative %: 77.6 % — ABNORMAL HIGH (ref 43.0–77.0)
Platelets: 321 10*3/uL (ref 150.0–400.0)
RBC: 4.54 Mil/uL (ref 4.22–5.81)
RDW: 12.4 % (ref 11.5–14.6)
WBC: 11.8 10*3/uL — ABNORMAL HIGH (ref 4.5–10.5)

## 2010-08-08 LAB — TSH: TSH: 0.46 u[IU]/mL (ref 0.35–5.50)

## 2010-08-08 LAB — LIPID PANEL
Cholesterol: 222 mg/dL — ABNORMAL HIGH (ref 0–200)
HDL: 55.8 mg/dL (ref >39.00)
Total CHOL/HDL Ratio: 4
Triglycerides: 103 mg/dL (ref 0.0–149.0)
VLDL: 20.6 mg/dL (ref 0.0–40.0)

## 2010-08-08 LAB — POCT URINALYSIS DIPSTICK
Bilirubin, UA: NEGATIVE
Blood, UA: NEGATIVE
Glucose, UA: NEGATIVE
Ketones, UA: NEGATIVE
Leukocytes, UA: NEGATIVE
Nitrite, UA: NEGATIVE
Spec Grav, UA: 1.025
Urobilinogen, UA: 0.2
pH, UA: 7

## 2010-08-08 LAB — BASIC METABOLIC PANEL
BUN: 13 mg/dL (ref 6–23)
CO2: 29 mEq/L (ref 19–32)
Calcium: 9.1 mg/dL (ref 8.4–10.5)
Chloride: 102 mEq/L (ref 96–112)
Creatinine, Ser: 0.7 mg/dL (ref 0.4–1.5)
GFR: 122.64 mL/min (ref >60.00)
Glucose, Bld: 93 mg/dL (ref 70–99)
Potassium: 4.3 mEq/L (ref 3.5–5.1)
Sodium: 139 mEq/L (ref 135–145)

## 2010-08-08 LAB — LDL CHOLESTEROL, DIRECT: Direct LDL: 147 mg/dL

## 2010-08-08 LAB — PSA: PSA: 0.58 ng/mL (ref 0.10–4.00)

## 2010-08-18 ENCOUNTER — Encounter: Payer: Self-pay | Admitting: Family Medicine

## 2010-08-19 ENCOUNTER — Encounter: Payer: Self-pay | Admitting: Family Medicine

## 2010-08-19 ENCOUNTER — Other Ambulatory Visit: Payer: Self-pay | Admitting: Gastroenterology

## 2010-08-19 ENCOUNTER — Ambulatory Visit (INDEPENDENT_AMBULATORY_CARE_PROVIDER_SITE_OTHER): Payer: BC Managed Care – PPO | Admitting: Family Medicine

## 2010-08-19 DIAGNOSIS — J45901 Unspecified asthma with (acute) exacerbation: Secondary | ICD-10-CM

## 2010-08-19 DIAGNOSIS — R002 Palpitations: Secondary | ICD-10-CM

## 2010-08-19 DIAGNOSIS — Z136 Encounter for screening for cardiovascular disorders: Secondary | ICD-10-CM

## 2010-08-19 DIAGNOSIS — Z8719 Personal history of other diseases of the digestive system: Secondary | ICD-10-CM

## 2010-08-19 DIAGNOSIS — K222 Esophageal obstruction: Secondary | ICD-10-CM

## 2010-08-19 DIAGNOSIS — J309 Allergic rhinitis, unspecified: Secondary | ICD-10-CM

## 2010-08-19 DIAGNOSIS — K219 Gastro-esophageal reflux disease without esophagitis: Secondary | ICD-10-CM

## 2010-08-19 DIAGNOSIS — IMO0001 Reserved for inherently not codable concepts without codable children: Secondary | ICD-10-CM

## 2010-08-19 MED ORDER — ALBUTEROL SULFATE HFA 108 (90 BASE) MCG/ACT IN AERS: 2.0000 | INHALATION_SPRAY | RESPIRATORY_TRACT | Status: DC | PRN

## 2010-08-19 MED ORDER — FLUTICASONE PROPIONATE HFA 44 MCG/ACT IN AERO: 1.0000 | INHALATION_SPRAY | Freq: Two times a day (BID) | RESPIRATORY_TRACT | Status: DC

## 2010-08-19 MED ORDER — BECLOMETHASONE DIPROP MONOHYD 42 MCG/SPRAY NA SUSP: 2.0000 | NASAL | Status: DC | PRN

## 2010-08-19 NOTE — Telephone Encounter (Signed)
The patient has been notified of this information and all questions answered. Pt also scheduled for a ROV.

## 2010-08-19 NOTE — Progress Notes (Signed)
  Subjective:    Patient ID: Jake [name], male    DOB: 27-Apr-1961, 50 y.o.   MRN: [id number]  HPI Jake [name] is a 50 year old male, married, nonsmoker, who comes in today for a general physical examination because of some underlying health issues and concerns.  He has a history of underlying allergic rhinitis, for which he uses Beconase HQ nasal spray.  History history of underlying asthma, for which he uses albuterol p.r.n. And Flovent 44.  Does one puff b.i.d. In the past, year.  He's had 3 or 4 episodes of asthma.  Again, I recommend he do the Flovent daily, and episodically.  He has concerns about long-term use of the Nexium referred back to GI on that issue.  Five years ago prior to having his lumbar disk surgery.  It had a cardiac evaluation for palpitations.  Cardiac exam is normal.  Recently had a cervical fusion C5 to C7 by Dr. Danielle Dess.  Surgery was successful.  No complications recovering well.  It is noted.  He had a colonoscopy and endoscopy.  He was noted to have a Schatzki's ring, and a stricture.   Review of Systems  Constitutional: Negative.   HENT: Negative.   Eyes: Negative.   Respiratory: Negative.   Cardiovascular: Negative.   Gastrointestinal: Negative.   Genitourinary: Negative.   Musculoskeletal: Negative.   Skin: Negative.   Neurological: Negative.   Hematological: Negative.   Psychiatric/Behavioral: Negative.        Objective:   Physical Exam  Constitutional: He is oriented to person, place, and time. He appears well-developed and well-nourished.  HENT:  Head: Normocephalic and atraumatic.  Right Ear: External ear normal.  Left Ear: External ear normal.  Nose: Nose normal.  Mouth/Throat: Oropharynx is clear and moist.  Eyes: Conjunctivae and EOM are normal. Pupils are equal, round, and reactive to light.  Neck: Normal range of motion. Neck supple. No JVD present. No tracheal deviation present. No thyromegaly present.  Cardiovascular: Normal rate,  regular rhythm, normal heart sounds and intact distal pulses.  Exam reveals no gallop and no friction rub.   No murmur heard. Pulmonary/Chest: Effort normal and breath sounds normal. No stridor. No respiratory distress. He has no wheezes. He has no rales. He exhibits no tenderness.  Abdominal: Soft. Bowel sounds are normal. He exhibits no distension and no mass. There is no tenderness. There is no rebound and no guarding.  Genitourinary: Rectum normal, prostate normal and penis normal. Guaiac negative stool. No penile tenderness.  Musculoskeletal: Normal range of motion. He exhibits no edema and no tenderness.  Lymphadenopathy:    He has no cervical adenopathy.  Neurological: He is alert and oriented to person, place, and time. He has normal reflexes. No cranial nerve deficit. He exhibits normal muscle tone.  Skin: Skin is warm and dry. No rash noted. No erythema. No pallor.  Psychiatric: He has a normal mood and affect. His behavior is normal. Judgment and thought content normal.          Assessment & Plan:  Asthma.........Marland Kitchen Recommend he use the Flovent one puff daily.  Allergic rhinitis.  Okay to use medication p.r.n.  History of reflux esophagitis and esophageal stricture recommend a daily PPI consult with GI along term use.  Recent surgical this surgery successful.  History of lumbar disk surgery successful.  Right herniorrhaphy.  Successful.

## 2010-08-19 NOTE — Telephone Encounter (Signed)
Please have him start taking pepcid or zantac twice daily and he should have rov with me in 6-7 weeks to see how that is going.

## 2010-08-19 NOTE — Telephone Encounter (Signed)
Pt is had a bone fusion 6 months ago and stopped the Nexium because he heard it causes bone loss and wants to know if he should switch to Tums.  He has had several episodes of heartburn since stopping the Nexium.  Please advise

## 2010-08-19 NOTE — Patient Instructions (Signed)
I would recommend you do one puff of the Flovent twice daily.  Okay to use the allergy medicines p.r.n.  Consult with GI concerning the long-term use of the Nexium.  I would recommend you switched to give her counter Prilosec........ 20 mg daily in the, morning it's just as effective and cheaper and the long-term use.  Return yearly for general physical exam

## 2010-10-07 ENCOUNTER — Encounter: Payer: Self-pay | Admitting: Gastroenterology

## 2010-10-07 ENCOUNTER — Ambulatory Visit (INDEPENDENT_AMBULATORY_CARE_PROVIDER_SITE_OTHER): Payer: BC Managed Care – PPO | Admitting: Gastroenterology

## 2010-10-07 DIAGNOSIS — K5900 Constipation, unspecified: Secondary | ICD-10-CM

## 2010-10-07 DIAGNOSIS — R1310 Dysphagia, unspecified: Secondary | ICD-10-CM

## 2010-10-07 DIAGNOSIS — K219 Gastro-esophageal reflux disease without esophagitis: Secondary | ICD-10-CM

## 2010-10-07 NOTE — Patient Instructions (Signed)
Please start taking citrucel (orange flavored) powder fiber supplement.  This may cause some bloating at first but that usually goes away. Begin with a small spoonful and work your way up to a large, heaping spoonful daily over a week. You will be set up for an upper endoscopy (will plan on celiac biopsies and evaluation of the esophageal stricture). Stay on twice daily zantac as an antiacid. A copy of this information will be made available to Dr. Tawanna Cooler.

## 2010-10-07 NOTE — Progress Notes (Signed)
HPI: This is a very pleasant 50 yo man whom I last sw 5 years ago. 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   He wonders about possibly having celiac sprue.  He has been having irregular BMs, softer at times, constipated at times. Alternating a lot.   Mainly constipated.  Sometimes incomplete evacuation sensation.  No overt GI bleeding.  He has gained weight slowly over the past few years.  +minor intermittent dysphagia.  Had "sour" feeling in his bowels.  Has had aching in right side of abdomen for over week.    Stopped PPI recently due to concerns about bone issues. Changed to zantac 150 taking it bid (before bf and dinner).  Drinks 1-2 cups coffee a day.   Review of systems: Pertinent positive and negative review of systems were noted in the above HPI section.  All other review of systems was otherwise negative.   Past Medical History, Past Surgical History, Family History, Social History, Current Medications, Allergies were all reviewed with the patient via [facility] electronic medical record system.   Physical Exam: BP 132/78  Pulse 60  Ht 5\' 10"  (1.778 m)  Wt 186 lb 9.6 oz (84.641 kg)  BMI 26.77 kg/m2 Constitutional: generally well-appearing Psychiatric: alert and oriented x3 Eyes: extraocular movements intact Mouth: oral pharynx moist, no lesions Neck: supple no lymphadenopathy Cardiovascular: heart regular rate and rhythm Lungs: clear to auscultation bilaterally Abdomen: soft, nontender, nondistended, no obvious ascites, no peritoneal signs, normal bowel sounds Extremities: no lower extremity edema bilaterally Skin: no lesions on visible extremities    Assessment and plan: 50 y.o. male with alternating bowel, constipation predomiantn, intermittent dysphagia  Will plan on EGD (biopsies for sprue and dilation of  prev known shatzki's ring...cuasing dsypahgia now).  He will start fiber for alternating constipation/diarrhea.  Stay on H2  blocker

## 2010-10-10 ENCOUNTER — Ambulatory Visit (AMBULATORY_SURGERY_CENTER): Payer: BC Managed Care – PPO | Admitting: Gastroenterology

## 2010-10-10 ENCOUNTER — Encounter: Payer: Self-pay | Admitting: Gastroenterology

## 2010-10-10 DIAGNOSIS — R1310 Dysphagia, unspecified: Secondary | ICD-10-CM

## 2010-10-10 DIAGNOSIS — D1330 Benign neoplasm of unspecified part of small intestine: Secondary | ICD-10-CM

## 2010-10-10 DIAGNOSIS — K222 Esophageal obstruction: Secondary | ICD-10-CM

## 2010-10-10 DIAGNOSIS — K3189 Other diseases of stomach and duodenum: Secondary | ICD-10-CM

## 2010-10-10 DIAGNOSIS — K449 Diaphragmatic hernia without obstruction or gangrene: Secondary | ICD-10-CM

## 2010-10-10 MED ORDER — SODIUM CHLORIDE 0.9 % IV SOLN: 500.0000 mL | INTRAVENOUS | Status: DC

## 2010-10-10 NOTE — Patient Instructions (Signed)
A Schatzki's ring was seen and your esophagus was dilated.  A hiatal hernia was also seen.  Biopsies of your duodenum was taken and you will receive a letter in the mail within 2-3 weeks with the results.  Copy of dilatation diet was given to your care partner.  Clear liquid starting at 1000 am through 11:00 am.  11:00 am through the rest of the day you will be on soft foods.  See list provided.  If swallowing okay tomorrow, you may resume your prior diet tomorrow am.  Please call with any questions or concerns.

## 2010-10-13 ENCOUNTER — Telehealth: Payer: Self-pay | Admitting: *Deleted

## 2010-10-13 NOTE — Telephone Encounter (Signed)

## 2010-10-14 NOTE — Assessment & Plan Note (Signed)
[facility]                         ELECTROPHYSIOLOGY OFFICE NOTE   NAME:FARLEYDonzell, Coller                      MRN:          [id number]  DATE:08/30/2007                            DOB:          1960-07-26    REFERRING PHYSICIAN:  Rachael Fee, MD   Mr. Grantham is referred today by Dr. Wendall Papa for evaluation of chest  pain which is atypical.  He is a very pleasant 50 year old man whose  health has been quite good.  He has had gastroesophageal reflux symptoms  in the past.  He states that over the last several weeks, particularly  in the last week, he has had difficulty with sleep and noted some  shortness of breath which is worse than usual.  His thinks that exercise  tolerance has decreased in the last several months.  He does also note  associated midepigastric discomfort and chest pressure with this.  He  has fatigue and weakness as well.  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  He has never had syncope.  He denies any rapid  palpitations.   SOCIAL HISTORY:  The patient is married.  He is a father of four  children.  He has a history of tobacco use but stopped smoking in 1994.  He drinks two glasses of wine per week and drinks a cup of coffee or two  per day.   FAMILY HISTORY:  Notable for a father with valvular heart disease status  post surgery.  Both of his parents are alive.  He has no premature  history of coronary disease.   PAST SURGICAL HISTORY:  Notable for back surgery in 1997.  He does not  have specific details about this.   REVIEW OF SYSTEMS:  Notable for occasional seasonal allergic rhinitis  and gastroesophageal reflux.  He has a history of diagnosis of hiatal  hernia.  He has fatigue and constipation.  The rest of his review of  systems was negative.   PHYSICAL EXAMINATION:  GENERAL APPEARANCE:  He is a pleasant, well-  appearing  50 year old man in no acute distress.  VITAL SIGNS:  The blood pressure today was 141/82, the pulse was 65 and  regular, respirations were 18.  Weight was 190 pounds.  HEENT:  Normocephalic and atraumatic.  Pupils are equal and round.  Oropharynx moist.  Sclerae anicteric.  NECK:  No jugular venous distension or thyromegaly.  Trachea is midline.  Carotid 2+ and symmetric.  LUNGS:  Clear bilaterally to auscultation.  No wheezes, rales or rhonchi  are present.  No increased work of breathing.  CARDIOVASCULAR:  Regular rate and rhythm with normal S1 and S2.  No  murmurs, rubs or gallops.  The PMI was not enlarged or laterally  displaced.  ABDOMEN:  Soft, nontender, nondistended.  No organomegaly.  Bowel sounds  present.  No rebound or guarding.  EXTREMITIES:  No cyanosis, clubbing or edema.  Pulses 2+ and symmetric.  NEUROLOGIC:  Alert and oriented x3.  Cranial nerves intact.  Strength  5/5  and symmetric.   EKG demonstrated sinus rhythm with incomplete right bundle branch block.  There were no specific ST-T wave abnormalities.   IMPRESSION:  1. Atypical chest pain in a patient with very little in the way of      cardiac risk factors.  2. History of gastroesophageal reflux disease.   DISCUSSION:  The patient's symptoms are fairly typical but he does have  some risk factors and for this reason, I have recommend exercise  treadmill test and a 2-D echo.  This will be scheduled at the earliest  possible convenient time.  If the above results are  normal, then  referral back to GI for additional evaluation makes the most sense to  me.     Doylene Canning. Ladona Ridgel, MD  Electronically Signed    GWT/MedQ  DD: 08/30/2007  DT: 08/31/2007  Job #: [id number]

## 2010-10-14 NOTE — Op Note (Signed)
[name], [name]               ACCOUNT NO.:  [id number]   MEDICAL RECORD NO.:  [id number]          PATIENT TYPE:  AMB   LOCATION:  [facility]                         FACILITY:  [facility]   PHYSICIAN:  Thornton Park. Daphine Deutscher, MD  DATE OF BIRTH:  13-Feb-1961   DATE OF PROCEDURE:  04/20/2008  DATE OF DISCHARGE:                               OPERATIVE REPORT   PREOPERATIVE DIAGNOSIS:  Right inguinal hernia.   POSTOPERATIVE DIAGNOSIS:  Right direct inguinal hernia.   PROCEDURE:  Right inguinal herniorrhaphy with Ultrapro mesh.   SURGEON:  Thornton Park. Daphine Deutscher, M.D.   ANESTHESIA:  General.   DESCRIPTION OF PROCEDURE:  Jake [name] is a 50 year old white male  brought to OR #1 at [facility] on April 20, 2008 and  given general.  He is latex sensitive so latex-free setup was used.  The  area was clipped by me and then prepped with Techni-Care and draped  sterilely.  The right side previously had been identified with his  assistance.  I made a mark on the right lower quadrant and cut through  that for subsequent reapposition of the skin and carried this down to  the fatty tissue to the external oblique.  The fibers were incised along  their the fibers.  The area was injected with Marcaine at that point and  the cord was mobilized in a silicone Foley was placed around the cord  structures.  I was looking for an indirect hernia because that is what I  thought he had, but it went proximally and carefully dissected away and  looked at the vas deferens anteromedially did not see a sac.  However,  at that point it was obvious there was a big bulge along the floor and  as I teased it away from the cord structures, it ballooned up and then  palpation revealed pretty much his floor was completely gone and  attenuated.  I went ahead and incised this dome and  put an [employer]  inside and could see that  basically the floor was pretty well gone.  I  went ahead and with that protrusion reduced,  I sewed the attenuated  transversalis fascia together with a 2-0 Vicryl and that reduced the  hernia and then I repaired the defect using Ultrapro mesh sutured along  inguinal ligament with running 2-0 Prolene, and then medially secured it  with interrupted 2-0 Prolenes all the way around out to the rectus with  which I was suturing.  This seemed to completely repair the defect.  I  wrapped it around the cord structures and sutured it to itself and that  was the opening for the cord and it would adequately accommodate my  index finger.   I irrigated and injected again with some 0.5% Marcaine plain and then  closed the external oblique with a running 2-0 Vicryl and  4-0 Vicryl  used in Scarpa's fascia and subcutaneously, then using the little  crosshatch to approximate  the skin.  I then did some more subcutaneous stitches with Vicryl before  creating a final closure with running subcuticular 5-0  Monocryl and  Dermabond.  The patient seemed to tolerate the procedure well.  He was  given Tylox for pain and will follow up the office in about 3 weeks.      Thornton Park Daphine Deutscher, MD  Electronically Signed     MBM/MEDQ  D:  04/20/2008  T:  04/20/2008  Job:  [id number]

## 2010-10-14 NOTE — Assessment & Plan Note (Signed)
[facility]                         GASTROENTEROLOGY OFFICE NOTE   NAME:Jake [name]                      MRN:          [id number]  DATE:08/29/2007                            DOB:          08-14-1960    PRIMARY CARE PHYSICIAN:  Tinnie Gens A. Tawanna Cooler, MD   GI PROBLEM LIST:  1. Routine risk for colorectal cancer status post colonoscopy May 2007      for mild rectal bleeding.  One small polyp was removed.  This was      not adenomatous.  Repeat colonoscopy May 2017.  2. Gastroesophageal reflux disease, intermittent dysphagia, status      post EGD May 2007.  Schatzki ring dilated to 18 mm successfully.   INTERVAL HISTORY:  I saw Jake [name] about 2 years ago.  Since then, he  was taking Prilosec once daily which he weaned himself off of and was  taking Pepcid on a daily basis without as good a relief of his  intermittent indigestion, belching, and some burning in his epigastrium.  He recently saw Dr. Tawanna Cooler who put him back on the Prilosec.  He had  noticed some improvement in those symptoms, but they are still present  slightly.  He does not have any dysphagia.  He is also bothered by a new  chest discomfort.  He describes it as a pressure.  It is in his  substernal to left chest.  It has been happening usually with some  exertion.  He specifically noticed it when he was running with his 51-  year-old son recently.  He was not able to keep up with him, and he was  slightly winded and had some of this discomfort in his chest.  He also  felt a little more fatigued just in general over the past month or so.  He had no overt GI bleeding.   CURRENT MEDICINES:  1. Prilosec once daily.  2. Intal inhaler.  3. Flovent inhaler.  4. Beconase.  5. Claritin.   PHYSICAL EXAMINATION:  Weight 192 pounds which is up 15 pounds since his  visit two years ago.  Blood pressure 110/70, pulse 76.  CONSTITUTIONAL:  Generally well appearing.  LUNGS:  Clear to auscultation  bilaterally.  ABDOMEN:  Soft, nontender, nondistended, normal bowel sounds.  EXTREMITIES:  No lower extremity edema.  SKIN:  No rash or lesions __________ .   ASSESSMENT/PLAN:  A 50 year old man with probable mild gastroesophageal  reflux disease symptoms, chest pressure.  First, I am most concerned  about the new chest discomforts that he has been having.  Some of these  do sound esophageal, a jabbing sensation that he feels.  The other  component of the chest pressure associated with some mild shortness of  breath is concerning for cardiac etiology.  I discussed this with   cardiology on call, and he is asymptomatic now from a chest  discomfort standpoint.  He looks very stable, and so we feel it was safe  for him to be seen in the next 24 to 48 hours at [facility]  office.  From my standpoint, I  simply want to get him on increased dose  of Prilosec daily.  Currently, he is taking 20 mg over the counter pill  once daily, and I instructed him to double that.  He takes 40 mg once in  the morning.  He will get back in touch with me when he is through with  cardiac investigation, and at that point, if he is not improved on  increased dose of proton pump inhibitor, I will probably like to repeat  his EGD.  He has had a Schatzki's ring that required dilation and  perhaps this is symptomatic.  These are some symptoms from that,  although admittedly that usually causes more overt dysphagia.     Rachael Fee, MD  Electronically Signed    DPJ/MedQ  DD: 08/29/2007  DT: 08/29/2007  Job #: [id number]   cc:   Tinnie Gens A. Tawanna Cooler, MD

## 2010-10-17 NOTE — Procedures (Signed)
[facility]  Patient:    Jake [name], Jake [name]                      MRN: [id number] Proc. Date: 05/24/00 Adm. Date:  [id number] Attending:  Orland Mustard                           Procedure Report  PROCEDURE:  Esophagogastroduodenoscopy.  MEDICATIONS:  Cetacaine spray, fentanyl 50 mcg, Versed 5 mg IV.  INDICATIONS:  Persistent upper abdominal pain of unclear cause.  INFORMED CONSENT:  The procedure had been explained to the patient and consent obtained.  DESCRIPTION OF PROCEDURE:  With the patient in the left lateral decubitus position, the Olympus adult video endoscope was inserted blindly into the esophagus and advanced under direct visualization.  The distal esophagus was reached.  The patient did have a stricture, but it was not very tight, and the scope easily passed.  He had a hiatal hernia.  The stomach was entered, pylorus identified and passed.  Duodenum including the bulb and second portion seen well and were unremarkable.  The scope was withdrawn back into the stomach.  The antrum and body were normal.  Fundus and cardia were seen well on retroflexed view and were normal.  Again, the hiatal hernia and stricture were seen upon removal of the scope.  There was no significant inflammation. The patient tolerated the procedure well and maintained on low-flow oxygen and pulse oximetry throughout the procedure with no obvious problem.  ASSESSMENT: 1. Esophageal stricture. 2. No other abnormalities.  PLAN:  Will continue on Nexium and ______ .  Will see back in the office in January. DD:  05/24/00 TD:  05/24/00 Job: [id number]

## 2010-11-12 ENCOUNTER — Telehealth: Payer: Self-pay | Admitting: Gastroenterology

## 2010-11-12 NOTE — Telephone Encounter (Signed)
Notified pt that his path was negative- mailed him a copy of the path report.

## 2011-03-04 LAB — POCT HEMOGLOBIN-HEMACUE: Hemoglobin: 14.6

## 2011-05-13 ENCOUNTER — Ambulatory Visit (INDEPENDENT_AMBULATORY_CARE_PROVIDER_SITE_OTHER): Payer: BC Managed Care – PPO | Admitting: Family Medicine

## 2011-05-13 DIAGNOSIS — Z23 Encounter for immunization: Secondary | ICD-10-CM

## 2011-07-10 ENCOUNTER — Ambulatory Visit: Payer: BC Managed Care – PPO

## 2011-08-13 ENCOUNTER — Other Ambulatory Visit (INDEPENDENT_AMBULATORY_CARE_PROVIDER_SITE_OTHER): Payer: BC Managed Care – PPO

## 2011-08-13 DIAGNOSIS — Z0000 Encounter for general adult medical examination without abnormal findings: Secondary | ICD-10-CM

## 2011-08-13 LAB — POCT URINALYSIS DIPSTICK
Bilirubin, UA: NEGATIVE
Blood, UA: NEGATIVE
Glucose, UA: NEGATIVE
Ketones, UA: NEGATIVE
Leukocytes, UA: NEGATIVE
Nitrite, UA: NEGATIVE
Protein, UA: NEGATIVE
Spec Grav, UA: 1.025
Urobilinogen, UA: 0.2
pH, UA: 6

## 2011-08-13 LAB — CBC WITH DIFFERENTIAL/PLATELET
Basophils Absolute: 0 10*3/uL (ref 0.0–0.1)
Basophils Relative: 0.6 % (ref 0.0–3.0)
Eosinophils Absolute: 0.2 10*3/uL (ref 0.0–0.7)
Eosinophils Relative: 2.8 % (ref 0.0–5.0)
HCT: 43.8 % (ref 39.0–52.0)
Hemoglobin: 14.6 g/dL (ref 13.0–17.0)
Lymphocytes Relative: 38.2 % (ref 12.0–46.0)
Lymphs Abs: 2.3 10*3/uL (ref 0.7–4.0)
MCHC: 33.3 g/dL (ref 30.0–36.0)
MCV: 91.2 fl (ref 78.0–100.0)
Monocytes Absolute: 0.4 10*3/uL (ref 0.1–1.0)
Monocytes Relative: 7.5 % (ref 3.0–12.0)
Neutro Abs: 3.1 10*3/uL (ref 1.4–7.7)
Neutrophils Relative %: 50.9 % (ref 43.0–77.0)
Platelets: 252 10*3/uL (ref 150.0–400.0)
RBC: 4.8 Mil/uL (ref 4.22–5.81)
RDW: 12 % (ref 11.5–14.6)
WBC: 6 10*3/uL (ref 4.5–10.5)

## 2011-08-13 LAB — BASIC METABOLIC PANEL
BUN: 12 mg/dL (ref 6–23)
CO2: 31 mEq/L (ref 19–32)
Calcium: 9.4 mg/dL (ref 8.4–10.5)
Chloride: 105 mEq/L (ref 96–112)
Creatinine, Ser: 1 mg/dL (ref 0.4–1.5)
GFR: 86.6 mL/min (ref >60.00)
Glucose, Bld: 98 mg/dL (ref 70–99)
Potassium: 4.9 mEq/L (ref 3.5–5.1)
Sodium: 141 mEq/L (ref 135–145)

## 2011-08-13 LAB — LIPID PANEL
Cholesterol: 216 mg/dL — ABNORMAL HIGH (ref 0–200)
HDL: 58.3 mg/dL (ref >39.00)
Total CHOL/HDL Ratio: 4
Triglycerides: 81 mg/dL (ref 0.0–149.0)
VLDL: 16.2 mg/dL (ref 0.0–40.0)

## 2011-08-13 LAB — HEPATIC FUNCTION PANEL
ALT: 18 U/L (ref 0–53)
AST: 17 U/L (ref 0–37)
Albumin: 4.4 g/dL (ref 3.5–5.2)
Alkaline Phosphatase: 66 U/L (ref 39–117)
Bilirubin, Direct: 0.1 mg/dL (ref 0.0–0.3)
Total Bilirubin: 0.4 mg/dL (ref 0.3–1.2)
Total Protein: 7 g/dL (ref 6.0–8.3)

## 2011-08-13 LAB — LDL CHOLESTEROL, DIRECT: Direct LDL: 131.6 mg/dL

## 2011-08-13 LAB — TSH: TSH: 1.72 u[IU]/mL (ref 0.35–5.50)

## 2011-08-13 LAB — PSA: PSA: 0.66 ng/mL (ref 0.10–4.00)

## 2011-08-20 ENCOUNTER — Encounter: Payer: BC Managed Care – PPO | Admitting: Family Medicine

## 2011-08-24 ENCOUNTER — Ambulatory Visit (INDEPENDENT_AMBULATORY_CARE_PROVIDER_SITE_OTHER): Payer: BC Managed Care – PPO | Admitting: Family Medicine

## 2011-08-24 ENCOUNTER — Encounter: Payer: BC Managed Care – PPO | Admitting: Family Medicine

## 2011-08-24 ENCOUNTER — Encounter: Payer: Self-pay | Admitting: Family Medicine

## 2011-08-24 VITALS — BP 120/80 | Temp 98.5°F | Ht 69.5 in | Wt 178.0 lb

## 2011-08-24 DIAGNOSIS — K219 Gastro-esophageal reflux disease without esophagitis: Secondary | ICD-10-CM

## 2011-08-24 DIAGNOSIS — Z8719 Personal history of other diseases of the digestive system: Secondary | ICD-10-CM

## 2011-08-24 DIAGNOSIS — J309 Allergic rhinitis, unspecified: Secondary | ICD-10-CM

## 2011-08-24 DIAGNOSIS — IMO0001 Reserved for inherently not codable concepts without codable children: Secondary | ICD-10-CM

## 2011-08-24 DIAGNOSIS — K222 Esophageal obstruction: Secondary | ICD-10-CM

## 2011-08-24 DIAGNOSIS — R0789 Other chest pain: Secondary | ICD-10-CM

## 2011-08-24 DIAGNOSIS — J45901 Unspecified asthma with (acute) exacerbation: Secondary | ICD-10-CM

## 2011-08-24 DIAGNOSIS — Z0000 Encounter for general adult medical examination without abnormal findings: Secondary | ICD-10-CM

## 2011-08-24 MED ORDER — FLUTICASONE PROPIONATE HFA 44 MCG/ACT IN AERO: 1.0000 | INHALATION_SPRAY | Freq: Two times a day (BID) | RESPIRATORY_TRACT | Status: DC

## 2011-08-24 MED ORDER — ALBUTEROL SULFATE HFA 108 (90 BASE) MCG/ACT IN AERS: 2.0000 | INHALATION_SPRAY | RESPIRATORY_TRACT | Status: DC | PRN

## 2011-08-24 MED ORDER — BECLOMETHASONE DIPROP MONOHYD 42 MCG/SPRAY NA SUSP: 2.0000 | Freq: Two times a day (BID) | NASAL | Status: DC

## 2011-08-24 NOTE — Progress Notes (Signed)
Subjective:    Patient ID: Jake [name], male    DOB: 12-Mar-1961, 51 y.o.   MRN: [id number]  HPI [name] is a 51 year old married male nonsmoker with 4 sons age 54-8 who comes in today for general physical examination  Has a history of allergic rhinitis for use a steroid nasal spray and Ventolin when necessary for asthma  He takes Motrin 400 mg twice a day when necessary for neck and back pain also he's had a cervical fusion and a lumbar discectomy by Dr. Danielle Dess and occasionally he'll take Vicodin for severe pain. Last surgery was the cervical surgery in 2011 and he states he's done well.  He's been off week and he says his GI symptoms are better. He's had a history of esophageal stricture and dilation. He's on Zantac 150 mg twice a day. He also has a steroid inhaler that he uses when necessary Flovent 44 one puff twice a day   Review of Systems  Constitutional: Negative.   HENT: Negative.   Eyes: Negative.   Respiratory: Negative.   Cardiovascular: Negative.   Gastrointestinal: Negative.   Genitourinary: Negative.   Musculoskeletal: Negative.   Skin: Negative.   Neurological: Negative.   Hematological: Negative.   Psychiatric/Behavioral: Negative.        Objective:   Physical Exam  Constitutional: He is oriented to person, place, and time. He appears well-developed and well-nourished. No distress.  HENT:  Head: Normocephalic and atraumatic.  Right Ear: External ear normal.  Left Ear: External ear normal.  Nose: Nose normal.  Mouth/Throat: Oropharynx is clear and moist. No oropharyngeal exudate.  Eyes: Conjunctivae and EOM are normal. Pupils are equal, round, and reactive to light. Right eye exhibits no discharge. Left eye exhibits no discharge. No scleral icterus.  Neck: Normal range of motion. Neck supple. No JVD present. No tracheal deviation present. No thyromegaly present.  Cardiovascular: Normal rate, regular rhythm, normal heart sounds and intact distal pulses.  Exam  reveals no gallop and no friction rub.   No murmur heard. Pulmonary/Chest: Effort normal and breath sounds normal. No stridor. No respiratory distress. He has no wheezes. He has no rales. He exhibits no tenderness.  Abdominal: Soft. Bowel sounds are normal. He exhibits no distension and no mass. There is no tenderness. There is no rebound and no guarding.  Genitourinary: Rectum normal, prostate normal and penis normal. Guaiac negative stool. No penile tenderness.  Musculoskeletal: Normal range of motion. He exhibits no edema and no tenderness.  Lymphadenopathy:    He has no cervical adenopathy.  Neurological: He is alert and oriented to person, place, and time. He has normal reflexes. He displays normal reflexes. No cranial nerve deficit. He exhibits normal muscle tone.  Skin: Skin is warm and dry. No rash noted. He is not diaphoretic. No erythema. No pallor.       Total body skin exam normal except for scar left neck from previous cervical fusion and scar in lumbar back from previous lumbar discectomy.  Psychiatric: He has a normal mood and affect. His behavior is normal. Judgment and thought content normal.          Assessment & Plan:  Healthy male  History of cervical fusion 2011 prior lumbar disc surgery asymptomatic at this time  History of allergic rhinitis and asthma continue albuterol and Flovent and Beconase when necessary  History of reflux esophagitis and esophageal stricture continue diet and Zantac 150 mg twice a day  Chest wall pain again reassured Motrin when necessary

## 2011-08-24 NOTE — Patient Instructions (Signed)
Continue your good health habits  Return in one year sooner if any problems 

## 2011-12-10 ENCOUNTER — Telehealth: Payer: Self-pay | Admitting: Family Medicine

## 2011-12-10 NOTE — Telephone Encounter (Signed)
Pt needs Dr Tawanna Cooler to sign a Health and Medical records from for [employer]. Bringing for by today for Dr Tawanna Cooler to sign.

## 2011-12-10 NOTE — Telephone Encounter (Signed)
Patient's form filled out

## 2011-12-23 ENCOUNTER — Ambulatory Visit: Payer: BC Managed Care – PPO | Admitting: Sports Medicine

## 2012-01-19 ENCOUNTER — Ambulatory Visit: Payer: BC Managed Care – PPO | Admitting: Sports Medicine

## 2012-01-28 ENCOUNTER — Other Ambulatory Visit: Payer: Self-pay | Admitting: Dermatology

## 2012-02-09 ENCOUNTER — Ambulatory Visit (INDEPENDENT_AMBULATORY_CARE_PROVIDER_SITE_OTHER): Payer: BC Managed Care – PPO | Admitting: Sports Medicine

## 2012-02-09 ENCOUNTER — Encounter: Payer: Self-pay | Admitting: Sports Medicine

## 2012-02-09 VITALS — BP 126/81 | HR 75 | Ht 70.0 in | Wt 170.0 lb

## 2012-02-09 DIAGNOSIS — M7750 Other enthesopathy of unspecified foot: Secondary | ICD-10-CM

## 2012-02-09 DIAGNOSIS — M7740 Metatarsalgia, unspecified foot: Secondary | ICD-10-CM

## 2012-02-09 DIAGNOSIS — M7660 Achilles tendinitis, unspecified leg: Secondary | ICD-10-CM

## 2012-02-09 NOTE — Assessment & Plan Note (Signed)
Pad placed on sports insole  See response in 1 month

## 2012-02-09 NOTE — Progress Notes (Signed)
  Subjective:    Patient ID: Jake [name], male    DOB: 05/28/1961, 51 y.o.   MRN: [id number]  HPI  Patient presents for bilateral foot pain. Pain has been present for many years. Patient is very active with hiking, backpacking, and running.  Right foot: Pain in the posterior heel at the distal achilles. Feels stiff and achy in the morning. Hinders his activity. Patient endorses intermittent swelling in the past, especially after heavy activity or wearing less supportive shoes. Pain is burning and dull. Has never been evaluated previously.  Left foot: Achy on plantar aspect just proximal to 4th metatarsal head. No swelling or bruising. Was previously told by a podiatrist that he needed to have the bone surgically shortened.  Review of Systems As per HPI  PMH: Reviewed in EMR. Most significant for multilevel cervical spine fusion and lumbar laminectomy.  Meds: - zantac, claritin, prn hydrocodone  All: Latex  FH: Father has DM2 and heart disease    Objective:   Physical Exam Gen: alert, well appearing, NAD Right foot: Bony prominence over posterior calcaneus at site of most pain. Somewhat TTP at the most distal attachment of the achilles tendon. No pain on palpation of body of achilles. No swelling evident. FROM in foot plantarflexion and dorsiflexion. Strength 5/5 on dorsiflexion, plantarflexion, inversion, and eversion w/o pain. Left foot: TTP on plantar surface just proximal to 4th metatarsal head. MT head noted to be sagging. Negative metatarsal squeeze.  Note - Depuytren's contractures in both hands  MSK U/S: Right Foot - Bone spur at distal attachment of achilles - Small split in distal achilles tendon - Increased hypoechogenicity indicated increased fluid in retrocalcaneal bursa Normal AT thickness Some neovessels     Assessment & Plan:  #1. Achilles Enthesopathy. Patient with bony spurring as well as small split in achilles tendon.  There is evidence of irritation with  increased fluid in retrocalcaneal bursa. - Eccentric heel raises on a book with both knees straight and bent - Patient with latex allergy, so was encourage to look into a latex-free compression sleeve - Ice twice daily - Patient given sports insoles with heel lift bilaterally to help relieve some of the stretch on the achilles tendon.   #2. Left 4th Metatarsal pain - Metatarsal pad added to right sport insole.  Followup 4-6 weeks.

## 2012-02-09 NOTE — Assessment & Plan Note (Signed)
We will try this will HEP  Icing  Use of heel lifts  Consider NTG protocol if not improved

## 2012-02-09 NOTE — Patient Instructions (Addendum)
Heel Pain  - Ice for 20 min 2-3 times per day especially after exercise - Consider compression sleeve - Heel exercises: eccentric strengthening  - 1 x 15 reps with knee straight on book  - 1 x 15 reps with knee bent on book  - progress to single leg as strength improves - Continue activity as tolerated - Avoid stretching achilles - Sport orthotics with heel lift and also metatarsal pad on left foot.

## 2012-03-22 ENCOUNTER — Encounter: Payer: Self-pay | Admitting: Sports Medicine

## 2012-03-22 ENCOUNTER — Ambulatory Visit (INDEPENDENT_AMBULATORY_CARE_PROVIDER_SITE_OTHER): Payer: BC Managed Care – PPO | Admitting: Sports Medicine

## 2012-03-22 VITALS — BP 123/84 | HR 70 | Ht 70.0 in | Wt 170.0 lb

## 2012-03-22 DIAGNOSIS — M7750 Other enthesopathy of unspecified foot: Secondary | ICD-10-CM

## 2012-03-22 DIAGNOSIS — M7740 Metatarsalgia, unspecified foot: Secondary | ICD-10-CM

## 2012-03-22 DIAGNOSIS — M7660 Achilles tendinitis, unspecified leg: Secondary | ICD-10-CM

## 2012-03-22 NOTE — Patient Instructions (Addendum)
It looks like you have some inflammation around the left achilles tendon today. Please work on getting those heel exercises into your daily routine. Put some styrofoam cups of water in the freezer.  Every evening get one out and do ice massage for 5 minutes on both heels.  This will increase blood flow to the area. You can try using the Voltaren gel - this will help remove inflammation. If you want, you can try Asper cream or Icy-Hot - these will do the same thing as the ice massage. Continue to use the heel lift that is most comfortable. We are going to try a different pad for your forefoot pain.  This pad is called a neuroma pad.  If it works well, you can put one on all your left insoles.

## 2012-03-22 NOTE — Assessment & Plan Note (Addendum)
Left worse than right today with some signs of inflammation on the left.  Continue heel lifts. Encouraged use of ice massage and eccentric exercises.  Will hold off on NTG patch as he does have a history of migraine headaches.  Will use Voltaren gel as well to help with inflammation.  Okay to use OTC Asper cream or Icy-hot.  Follow up as needed if no improvement.

## 2012-03-22 NOTE — Progress Notes (Signed)
  Subjective:    Patient ID: Jake [name], male    DOB: 02/11/1961, 51 y.o.   MRN: [id number]  HPI Jake [name] is coming in today for follow up on bilateral heel pain.  Continues to have bilaterally heel pain at achilles insertion site, worse on left than right.  Stiffness is worse in the mornings.  Continues to be active with walking hiking. Has not been running.  Doing well with passive stretching of the achilles.  Has not been icing or doing the exercises regularly.  Unable to tolerate the metatarsal pad for the left forefoot pain.  Still does have pain over his left forefoot.   Review of Systems     Objective:   Physical Exam Gen: alert, cooperative, NAD Right ankle: prominent posterior calcaneus, no erythema, tender over achilles insertion site, full AROM, 5/5 dorsi- and plantar-flexion Left ankle: prominent posterior calcaneus, no erythema, tender over achilles insertion site, full AROM, 5/5 dorsi- and plantar-flexion Left foot: 4th metatarsal prominent on ball of foot without overlying callus or erythema     Assessment & Plan:  Bilateral achilles tendonitis: No improvement but has not been compliant with treatment.  See problem list for full A&P. Left metatarsal pain: Stable.  See problem list for full A&P.

## 2012-03-22 NOTE — Assessment & Plan Note (Signed)
Left 4th metatarsal is dropped.  Unable to tolerate the metatarsal pad given previously.  Will try a neuroma pad which patient reports is already more comfortable.

## 2012-04-05 ENCOUNTER — Encounter: Payer: Self-pay | Admitting: Family Medicine

## 2012-04-05 ENCOUNTER — Ambulatory Visit (INDEPENDENT_AMBULATORY_CARE_PROVIDER_SITE_OTHER): Payer: BC Managed Care – PPO | Admitting: Family Medicine

## 2012-04-05 VITALS — BP 120/80 | Temp 97.4°F | Wt 174.0 lb

## 2012-04-05 DIAGNOSIS — R109 Unspecified abdominal pain: Secondary | ICD-10-CM

## 2012-04-05 DIAGNOSIS — N419 Inflammatory disease of prostate, unspecified: Secondary | ICD-10-CM

## 2012-04-05 DIAGNOSIS — R1030 Lower abdominal pain, unspecified: Secondary | ICD-10-CM

## 2012-04-05 LAB — POCT URINALYSIS DIPSTICK
Bilirubin, UA: NEGATIVE
Glucose, UA: 1.01
Ketones, UA: NEGATIVE
Leukocytes, UA: NEGATIVE
Nitrite, UA: NEGATIVE
Protein, UA: NEGATIVE
Spec Grav, UA: 1.01
Urobilinogen, UA: 0.2
pH, UA: 7

## 2012-04-05 NOTE — Patient Instructions (Signed)
Doxycycline 1 twice daily for 3 weeks  Followup in 3 weeks

## 2012-04-05 NOTE — Progress Notes (Signed)
  Subjective:    Patient ID: Jake [name], male    DOB: 1961/01/29, 51 y.o.   MRN: [id number]  HPI Jake [name] is a 51 year old male currently unemployed who comes in today with a 4 week history of lower abdominal pressure sensation and an abnormal color returned. He states his urine has turned green.  He has no fever vomiting or diarrhea no previous history of urinary tract infections.   Review of Systems General and urinary tract review of systems negative    Objective:   Physical Exam  Well-developed well-nourished male in no acute distress abdominal exam normal urinalysis shows small amount of blood otherwise negative      Assessment & Plan:  Probable smoldering prostatitis plan doxycycline twice a day for 3 weeks followup in 3 weeks to recheck UA

## 2012-04-26 ENCOUNTER — Ambulatory Visit (INDEPENDENT_AMBULATORY_CARE_PROVIDER_SITE_OTHER): Payer: BC Managed Care – PPO | Admitting: Family Medicine

## 2012-04-26 ENCOUNTER — Encounter: Payer: Self-pay | Admitting: Family Medicine

## 2012-04-26 VITALS — BP 120/80 | Temp 98.7°F | Wt 176.0 lb

## 2012-04-26 DIAGNOSIS — N419 Inflammatory disease of prostate, unspecified: Secondary | ICD-10-CM

## 2012-04-26 LAB — POCT URINALYSIS DIPSTICK
Bilirubin, UA: NEGATIVE
Blood, UA: NEGATIVE
Glucose, UA: NEGATIVE
Ketones, UA: NEGATIVE
Leukocytes, UA: NEGATIVE
Nitrite, UA: NEGATIVE
Protein, UA: NEGATIVE
Spec Grav, UA: <=1.005
Urobilinogen, UA: 0.2
pH, UA: 6

## 2012-04-26 NOTE — Patient Instructions (Signed)
The options would be to wait and see if this does not resolve spontaneously or restart the doxycycline and just take one daily for 2 weeks

## 2012-04-26 NOTE — Progress Notes (Signed)
  Subjective:    Patient ID: Jake [name], male    DOB: 1960/11/24, 51 y.o.   MRN: [id number]  HPI [name]  is a 51 year old male who comes in today for reevaluation of prostatitis  He took the doxycycline one twice daily stopped on Saturday because he had some irritation of his lips. No airway edema no skin rash. His urinalysis today is back to normal. He still has a pressure sensation over his prostate. We discussed options which included watchful waiting versus restarting the doxycycline one daily for 2 weeks to see if that will not resolve his symptoms. I left him shoes.   Review of Systems Urologic review of systems negative    Objective:   Physical Exam  Well-developed well-nourished male in no acute distress      Assessment & Plan:  Prostatitis resolving still with a pressure sensation treatment options as above

## 2013-01-18 ENCOUNTER — Ambulatory Visit: Payer: BC Managed Care – PPO

## 2013-01-18 ENCOUNTER — Ambulatory Visit (INDEPENDENT_AMBULATORY_CARE_PROVIDER_SITE_OTHER): Payer: BC Managed Care – PPO | Admitting: Family Medicine

## 2013-01-18 VITALS — BP 114/70 | HR 60 | Temp 98.0°F | Resp 16 | Ht 70.0 in | Wt 180.0 lb

## 2013-01-18 DIAGNOSIS — T148XXA Other injury of unspecified body region, initial encounter: Secondary | ICD-10-CM

## 2013-01-18 DIAGNOSIS — S6982XA Other specified injuries of left wrist, hand and finger(s), initial encounter: Secondary | ICD-10-CM

## 2013-01-18 DIAGNOSIS — M79609 Pain in unspecified limb: Secondary | ICD-10-CM

## 2013-01-18 DIAGNOSIS — S6990XA Unspecified injury of unspecified wrist, hand and finger(s), initial encounter: Secondary | ICD-10-CM

## 2013-01-18 DIAGNOSIS — S6980XA Other specified injuries of unspecified wrist, hand and finger(s), initial encounter: Secondary | ICD-10-CM

## 2013-01-18 MED ORDER — CEPHALEXIN 500 MG PO CAPS: 500.0000 mg | ORAL_CAPSULE | Freq: Four times a day (QID) | ORAL | Status: DC

## 2013-01-18 NOTE — Progress Notes (Signed)
[facility]:  Office Visit  Chief Complaint:  Chief Complaint  Patient presents with  . Puncture Wound    drove phillip's head screw into left thumb nail yesterday  last tdap 06/01/2008    HPI: Jake [name] is a 52 y.o. male who complains of :  UTD on TDaP He was using a power screw driver and accidentally drove it a little further in his left thumb than he intended, he had some drainage oozing from the puncture wound He did this yesterday< 12 hrs He denies any pain, redness, fevers, chills He doe not think it went down to the bone Deneis numbness, tingling, weakness  Past Medical History  Diagnosis Date  . Allergy   . Asthma   . GERD (gastroesophageal reflux disease)    Past Surgical History  Procedure Laterality Date  . Lumbar laminectomy    . Neck surgery      c5-7   History   Social History  . Marital Status: Married    Spouse Name: N/A    Number of Children: N/A  . Years of Education: N/A   Social History Main Topics  . Smoking status: Never Smoker   . Smokeless tobacco: Never Used  . Alcohol Use: No  . Drug Use: No  . Sexual Activity: Not on file   Other Topics Concern  . Not on file   Social History Narrative  . No narrative on file   Family History  Problem Relation Age of Onset  . Alcohol abuse    . Asthma    . Depression    . Valvular heart disease     Allergies  Allergen Reactions  . Latex     REACTION: RASH   Prior to Admission medications   Medication Sig Start Date End Date Taking? Authorizing Provider  acetaminophen (TYLENOL) 325 MG tablet Take 650 mg by mouth every 6 (six) hours as needed.     Yes Historical Provider, MD  albuterol (VENTOLIN HFA) 108 (90 BASE) MCG/ACT inhaler Inhale 2 puffs into the lungs as needed. 08/24/11  Yes Roderick Pee, MD  beclomethasone (BECONASE AQ) 42 MCG/SPRAY nasal spray Place 2 sprays into the nose 2 (two) times daily. Dose is for each nostril. 08/24/11  Yes Roderick Pee, MD   fluticasone (FLOVENT HFA) 44 MCG/ACT inhaler Inhale 1 puff into the lungs 2 (two) times daily. 08/24/11  Yes Roderick Pee, MD  HYDROcodone-acetaminophen (VICODIN) 5-500 MG per tablet Take 5-500 mg by mouth as needed. 01/28/12  Yes Historical Provider, MD  ibuprofen (ADVIL,MOTRIN) 400 MG tablet Take 400 mg by mouth every 6 (six) hours as needed.     Yes Historical Provider, MD  loratadine (CLARITIN) 10 MG tablet Take 10 mg by mouth daily.   Yes Historical Provider, MD  ranitidine (ZANTAC) 150 MG capsule Take 150 mg by mouth 2 (two) times daily.     Yes Historical Provider, MD     ROS: The patient denies fevers, chills, night sweats, unintentional weight loss, chest pain, palpitations, wheezing, dyspnea on exertion, nausea, vomiting, abdominal pain, dysuria, hematuria, melena, numbness, weakness, or tingling.   All other systems have been reviewed and were otherwise negative with the exception of those mentioned in the HPI and as above.    PHYSICAL EXAM: Filed Vitals:   01/18/13 1131  BP: 114/70  Pulse: 60  Temp: 98 F (36.7 C)  Resp: 16   Filed Vitals:   01/18/13 1131  Height: 5\' 10"  (  1.778 m)  Weight: 180 lb (81.647 kg)   Body mass index is 25.83 kg/(m^2).  General: Alert, no acute distress HEENT:  Normocephalic, atraumatic, oropharynx patent. EOMI, PERRLA Cardiovascular:  Regular rate and rhythm, no rubs murmurs or gallops.  No Carotid bruits, radial pulse intact. No pedal edema.  Respiratory: Clear to auscultation bilaterally.  No wheezes, rales, or rhonchi.  No cyanosis, no use of accessory musculature GI: No organomegaly, abdomen is soft and non-tender, positive bowel sounds.  No masses. Skin: No rashes. Neurologic: Facial musculature symmetric. Psychiatric: Patient is appropriate throughout our interaction. Lymphatic: No cervical lymphadenopathy Musculoskeletal: Gait intact. Left thumb- normal, puncture wound looks clean, no drainange, no erythema/pain/drainage, no e/o  infection 5/5 strength, sensation intact   LABS:    EKG/XRAY:   Primary read interpreted by Dr. Conley Rolls at [facility]. No fx/dislocation/foreing body  ASSESSMENT/PLAN: Encounter Diagnosis  Name Primary?  . Nailbed injury, left, initial encounter Yes   Scrubbed thumb clean with betadine scrubber He is to monitor for infection Rx for Keflex given in case of infection F/u prn Gross sideeffects, risk and benefits, and alternatives of medications d/w patient. Patient is aware that all medications have potential sideeffects and we are unable to predict every sideeffect or drug-drug interaction that may occur.  Elesia Pemberton PHUONG, DO 01/18/2013 11:49 AM

## 2013-01-18 NOTE — Patient Instructions (Addendum)
Puncture Wound °A puncture wound is an injury that extends through all layers of the skin and into the tissue beneath the skin (subcutaneous tissue). Puncture wounds become infected easily because germs often enter the body and go beneath the skin during the injury. Having a deep wound with a small entrance point makes it difficult for your caregiver to adequately clean the wound. This is especially true if you have stepped on a nail and it has passed through a dirty shoe or other situations where the wound is obviously contaminated. °CAUSES  °Many puncture wounds involve glass, nails, splinters, fish hooks, or other objects that enter the skin (foreign bodies). A puncture wound may also be caused by a human bite or animal bite. °DIAGNOSIS  °A puncture wound is usually diagnosed by your history and a physical exam. You may need to have an X-ray or an ultrasound to check for any foreign bodies still in the wound. °TREATMENT  °· Your caregiver will clean the wound as thoroughly as possible. Depending on the location of the wound, a bandage (dressing) may be applied. °· Your caregiver might prescribe antibiotic medicines. °· You may need a follow-up visit to check on your wound. Follow all instructions as directed by your caregiver. °HOME CARE INSTRUCTIONS  °· Change your dressing once per day, or as directed by your caregiver. If the dressing sticks, it may be removed by soaking the area in water. °· If your caregiver has given you follow-up instructions, it is very important that you return for a follow-up appointment. Not following up as directed could result in a chronic or permanent injury, pain, and disability. °· Only take over-the-counter or prescription medicines for pain, discomfort, or fever as directed by your caregiver. °· If you are given antibiotics, take them as directed. Finish them even if you start to feel better. °You may need a tetanus shot if: °· You cannot remember when you had your last tetanus  shot. °· You have never had a tetanus shot. °If you got a tetanus shot, your arm may swell, get red, and feel warm to the touch. This is common and not a problem. If you need a tetanus shot and you choose not to have one, there is a rare chance of getting tetanus. Sickness from tetanus can be serious. °You may need a rabies shot if an animal bite caused your puncture wound. °SEEK MEDICAL CARE IF:  °· You have redness, swelling, or increasing pain in the wound. °· You have red streaks going away from the wound. °· You notice a bad smell coming from the wound or dressing. °· You have yellowish-white fluid (pus) coming from the wound. °· You are treated with an antibiotic for infection, but the infection is not getting better. °· You notice something in the wound, such as rubber from your shoe, cloth, or another object. °· You have a fever. °· You have severe pain. °· You have difficulty breathing. °· You feel dizzy or faint. °· You cannot stop vomiting. °· You lose feeling, develop numbness, or cannot move a limb below the wound. °· Your symptoms worsen. °MAKE SURE YOU: °· Understand these instructions. °· Will watch your condition. °· Will get help right away if you are not doing well or get worse. °Document Released: 02/25/2005 Document Revised: 08/10/2011 Document Reviewed: 11/04/2010 °[employer]® Patient Information ©2014 [employer]. ° °

## 2013-04-12 ENCOUNTER — Ambulatory Visit: Payer: BC Managed Care – PPO | Admitting: Family Medicine

## 2013-04-12 ENCOUNTER — Ambulatory Visit: Payer: BC Managed Care – PPO

## 2013-04-12 VITALS — BP 128/78 | HR 86 | Temp 98.5°F | Resp 20 | Ht 69.75 in | Wt 177.8 lb

## 2013-04-12 DIAGNOSIS — D649 Anemia, unspecified: Secondary | ICD-10-CM

## 2013-04-12 DIAGNOSIS — R0602 Shortness of breath: Secondary | ICD-10-CM

## 2013-04-12 DIAGNOSIS — J45909 Unspecified asthma, uncomplicated: Secondary | ICD-10-CM

## 2013-04-12 DIAGNOSIS — M542 Cervicalgia: Secondary | ICD-10-CM

## 2013-04-12 DIAGNOSIS — S161XXA Strain of muscle, fascia and tendon at neck level, initial encounter: Secondary | ICD-10-CM

## 2013-04-12 DIAGNOSIS — R079 Chest pain, unspecified: Secondary | ICD-10-CM

## 2013-04-12 DIAGNOSIS — IMO0001 Reserved for inherently not codable concepts without codable children: Secondary | ICD-10-CM

## 2013-04-12 LAB — POCT CBC
Granulocyte percent: 50.2 %G (ref 37–80)
HCT, POC: 41.4 % — AB (ref 43.5–53.7)
Hemoglobin: 12.9 g/dL — AB (ref 14.1–18.1)
Lymph, poc: 2.6 (ref 0.6–3.4)
MCH, POC: 29.7 pg (ref 27–31.2)
MCHC: 31.2 g/dL — AB (ref 31.8–35.4)
MCV: 95.3 fL (ref 80–97)
MID (cbc): 0.4 (ref 0–0.9)
MPV: 7.3 fL (ref 0–99.8)
POC Granulocyte: 3 (ref 2–6.9)
POC LYMPH PERCENT: 43.7 %L (ref 10–50)
POC MID %: 6.1 %M (ref 0–12)
Platelet Count, POC: 245 10*3/uL (ref 142–424)
RBC: 4.34 M/uL — AB (ref 4.69–6.13)
RDW, POC: 12.2 %
WBC: 5.9 10*3/uL (ref 4.6–10.2)

## 2013-04-12 MED ORDER — ALBUTEROL SULFATE HFA 108 (90 BASE) MCG/ACT IN AERS: 2.0000 | INHALATION_SPRAY | RESPIRATORY_TRACT | Status: DC | PRN

## 2013-04-12 MED ORDER — FLUTICASONE PROPIONATE HFA 44 MCG/ACT IN AERO: 1.0000 | INHALATION_SPRAY | Freq: Two times a day (BID) | RESPIRATORY_TRACT | Status: DC

## 2013-04-12 NOTE — Progress Notes (Signed)
[address]   [place], [place]  [address]   [phone]  Subjective:    Patient ID: Jake [name], male    DOB: January 16, 1961, 52 y.o.   MRN: [id number]  This chart was scribed for Ethelda Chick, MD by Greggory Stallion, Medical Scribe. This patient's care was started at 5:28 PM.  HPI HPI Comments: Jake [name] is a 52 y.o. male who presents to the office complaining of difficulty breathing, intermittent sharp chest pain and tightness that started one week ago. He states he is also getting left sided headaches, cough and dizziness that all started around the same time and have been going on for 2-3 weeks. The chest pain comes 4-5 times per day and only lasts a few seconds. He states he has a history of asthma but this feels worse than his normal flare ups. Pt has used his albuterol inhaler 2-3 times per day recently. Pt states he hasn't had a lot of indigestion or reflux lately. He denies fever, chills, sweats, rhinorrhea, congestion, ear pain, sore throat, sneezing, numbness or tingling in his arms. Pt denies any recent chemical exposure or environmental allergies. He has had no hospitalizations for his asthma. Denies history of diabetes, HTN, high cholesterol. His father has a history of valve replacement but denies any other family history of heart problems. Pt had a colonoscopy when he was 52 years old to rule out Celiac disease and states nothing was found but he still cut out gluten. Pt is not currently taking Flovent, nasal spray, Zantac; he does take Claritin daily.  Chest pain is sporadic and does not worsen with exertion.    He also has neck pain that started a few months ago before the rest of his symptoms. 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 Danielle Dess of [facility]. He has not taken any pain medications for it.   S/p colonoscopy age 41; s/p EGD in 2012.  Patient Active Problem List   Diagnosis Date Noted  . Prostatitis 04/05/2012  . ASTHMA 10/03/2007  . GERD  10/03/2007  . VENTRAL HERNIA, SMALL 10/03/2007  . Allergic Rhinitis, Cause Unspecified 03/01/2007  . ESOPHAGEAL STRICTURE 03/01/2007  . POLYP, COLON 10/27/2005  . SCHATZKI'S RING, HX OF 10/27/2005   Past Medical History  Diagnosis Date  . Allergy   . Asthma   . GERD (gastroesophageal reflux disease)    Past Surgical History  Procedure Laterality Date  . Lumbar laminectomy    . Neck surgery      c5-7   Allergies  Allergen Reactions  . Latex     REACTION: RASH   Prior to Admission medications   Medication Sig Start Date End Date Taking? Authorizing Provider  acetaminophen (TYLENOL) 325 MG tablet Take 650 mg by mouth every 6 (six) hours as needed.     Yes Historical Provider, MD  albuterol (VENTOLIN HFA) 108 (90 BASE) MCG/ACT inhaler Inhale 2 puffs into the lungs as needed. 08/24/11  Yes Roderick Pee, MD  beclomethasone (BECONASE AQ) 42 MCG/SPRAY nasal spray Place 2 sprays into the nose 2 (two) times daily. Dose is for each nostril. 08/24/11  Yes Roderick Pee, MD  fluticasone (FLOVENT HFA) 44 MCG/ACT inhaler Inhale 1 puff into the lungs 2 (two) times daily. 08/24/11  Yes Roderick Pee, MD  HYDROcodone-acetaminophen (VICODIN) 5-500 MG per tablet Take 5-500 mg by mouth as needed. 01/28/12  Yes Historical Provider, MD  ibuprofen (ADVIL,MOTRIN) 400 MG tablet  Take 400 mg by mouth every 6 (six) hours as needed.     Yes Historical Provider, MD  loratadine (CLARITIN) 10 MG tablet Take 10 mg by mouth daily.   Yes Historical Provider, MD  ranitidine (ZANTAC) 150 MG capsule Take 150 mg by mouth 2 (two) times daily.     Yes Historical Provider, MD  cephALEXin (KEFLEX) 500 MG capsule Take 1 capsule (500 mg total) by mouth 4 (four) times daily. 01/18/13   Thao P Le, DO   History   Social History  . Marital Status: Married    Spouse Name: N/A    Number of Children: N/A  . Years of Education: N/A   Occupational History  . Not on file.   Social History Main Topics  . Smoking status: Never  Smoker   . Smokeless tobacco: Never Used  . Alcohol Use: No  . Drug Use: No  . Sexual Activity: Not on file   Other Topics Concern  . Not on file   Social History Narrative  . No narrative on file     Review of Systems  Constitutional: Negative for chills and fatigue.  HENT: Negative for congestion, ear pain, postnasal drip, rhinorrhea, sinus pressure and sore throat.   Respiratory: Positive for cough, chest tightness and shortness of breath. Negative for wheezing and stridor.   Cardiovascular: Positive for chest pain. Negative for palpitations and leg swelling.  Gastrointestinal: Negative for nausea.  Neurological: Negative for dizziness and light-headedness.       Objective:   Physical Exam  Constitutional: He is oriented to person, place, and time. He appears well-developed and well-nourished. No distress.  HENT:  Head: Normocephalic and atraumatic.  Right Ear: Tympanic membrane and ear canal normal.  Left Ear: Tympanic membrane and ear canal normal.  Nose: Nose normal.  Mouth/Throat: Uvula is midline, oropharynx is clear and moist and mucous membranes are normal.  Eyes: EOM are normal.  Neck: Neck supple. No JVD present.  Cardiovascular: Normal rate, regular rhythm and normal heart sounds.   Pulmonary/Chest: Effort normal and breath sounds normal. No respiratory distress. He has no decreased breath sounds. He has no wheezes. He has no rhonchi. He has no rales.  Musculoskeletal: Normal range of motion.  Cervical spine has full ROM except decreased extension. Tender to palpation on left lateral neck and trapezius region.  Positive pain with ROM of neck.   Lymphadenopathy:    He has no cervical adenopathy.  Neurological: He is alert and oriented to person, place, and time.  Motor 5/5 in bilateral upper extremities.  Skin: Skin is warm and dry.  Psychiatric: He has a normal mood and affect. His behavior is normal.   C-spine film: fusion intact without abnormality. NAD.   Chest x-ray: NAD.   EKG: sinus bradycardia; no acute ST changes.  Results for orders placed in visit on 04/12/13  POCT CBC      Result Value Range   WBC 5.9  4.6 - 10.2 K/uL   Lymph, poc 2.6  0.6 - 3.4   POC LYMPH PERCENT 43.7  10 - 50 %L   MID (cbc) 0.4  0 - 0.9   POC MID % 6.1  0 - 12 %M   POC Granulocyte 3.0  2 - 6.9   Granulocyte percent 50.2  37 - 80 %G   RBC 4.34 (*) 4.69 - 6.13 M/uL   Hemoglobin 12.9 (*) 14.1 - 18.1 g/dL   HCT, POC 45.4 (*) 09.8 - 53.7 %  MCV 95.3  80 - 97 fL   MCH, POC 29.7  27 - 31.2 pg   MCHC 31.2 (*) 31.8 - 35.4 g/dL   RDW, POC 29.5     Platelet Count, POC 245  142 - 424 K/uL   MPV 7.3  0 - 99.8 fL    Filed Vitals:   04/12/13 1621  BP: 128/78  Pulse: 86  Temp: 98.5 F (36.9 C)  TempSrc: Oral  Resp: 20  Height: 5' 9.75" (1.772 m)  Weight: 177 lb 12.8 oz (80.65 kg)  SpO2: 97%       Assessment & Plan:   Neck pain - Plan: POCT CBC, DG Chest 2 View, DG Cervical Spine Complete  Chest pain, unspecified - Plan: EKG 12-Lead, POCT CBC, DG Chest 2 View  SOB (shortness of breath) - Plan: EKG 12-Lead  Unspecified asthma(493.90)  Anemia  Asthma exacerbation, allergic - Plan: fluticasone (FLOVENT HFA) 44 MCG/ACT inhaler, albuterol (VENTOLIN HFA) 108 (90 BASE) MCG/ACT inhaler  Neck strain, initial encounter  1.  Asthma exacerbation:  New. Mild at this time with peak flow of 520 in office; recommend restarting Flovent daily; recommend increasing Albuterol to qid for the next 72 hours and then PRN. Also recommend restarting Zantac, steroid nasal spray, Claritin.  If no improvement in 1-2 weeks, recommend follow-up with PCP or here. 2.  SOB:  New. Associated with asthma exacerbation, sharp intermittent chest pain.  S/p EKG stable/normal; s/p CXR negative.  Mild anemia but not source of SOB.  RTC for acute worsening.   3.  Chest pain:  New. Atypical; sharp and shooting and associated with asthma exacerbation mild.  If persists, consider stress  testing; recommend restarting Zantac. 4.  Neck strain with known DDD cervical spine:  New. Onset several months ago.  No radicular symptoms.  Recommend Robaxin qhs scheduled for next two weeks; also recommend heat and passive ROM. Not associated with SOB, chest pain. 5.  Anemia:  New onset; recommend repeat labs in upcoming 1-2 weeks with PCP; asymptomatic; no recent GI symptoms.  S/p colonoscopy age 47 and s/p EGD in 2012.  I personally performed the services described in this documentation, which was scribed in my presence.  The recorded information has been reviewed and is accurate.

## 2013-04-12 NOTE — Patient Instructions (Signed)
1.  Recommend restarting:  Flovent, Claritin, nasal spray, Zantac. 2.  Recommend repeat labs in 1-2 weeks to follow-up on anemia.

## 2013-04-13 ENCOUNTER — Telehealth: Payer: Self-pay | Admitting: Family Medicine

## 2013-04-13 DIAGNOSIS — J309 Allergic rhinitis, unspecified: Secondary | ICD-10-CM

## 2013-04-13 MED ORDER — BECLOMETHASONE DIPROP MONOHYD 42 MCG/SPRAY NA SUSP: 2.0000 | Freq: Two times a day (BID) | NASAL | Status: DC

## 2013-04-13 NOTE — Telephone Encounter (Signed)
[facility] went to [facility] n[facility], DX w/ anemia. (12.9)  Advised pt to fu w/ another lab test in 2 weeks to ck again.. Is it ok to sch?  Pt would  like a refill of beclomethasone (BECONASE AQ) 42 MCG/SPRAY nasal spray [place] [address]

## 2013-04-13 NOTE — Telephone Encounter (Signed)
Please schedule an office visit with Dr Tawanna Cooler

## 2013-04-21 NOTE — Telephone Encounter (Signed)
lmom for pt to call back and scheduled an appt

## 2013-04-21 NOTE — Telephone Encounter (Signed)
Pt has been sch for 12/4

## 2013-04-23 ENCOUNTER — Ambulatory Visit: Payer: BC Managed Care – PPO | Admitting: Family Medicine

## 2013-04-23 VITALS — BP 118/84 | HR 71 | Temp 98.2°F | Resp 18 | Ht 69.75 in | Wt 177.0 lb

## 2013-04-23 DIAGNOSIS — R079 Chest pain, unspecified: Secondary | ICD-10-CM

## 2013-04-23 DIAGNOSIS — M5030 Other cervical disc degeneration, unspecified cervical region: Secondary | ICD-10-CM

## 2013-04-23 DIAGNOSIS — M542 Cervicalgia: Secondary | ICD-10-CM

## 2013-04-23 DIAGNOSIS — R42 Dizziness and giddiness: Secondary | ICD-10-CM

## 2013-04-23 LAB — POCT CBC
Granulocyte percent: 60 %G (ref 37–80)
HCT, POC: 48.4 % (ref 43.5–53.7)
Hemoglobin: 15.4 g/dL (ref 14.1–18.1)
Lymph, poc: 2 (ref 0.6–3.4)
MCH, POC: 30.5 pg (ref 27–31.2)
MCHC: 31.8 g/dL (ref 31.8–35.4)
MCV: 95.8 fL (ref 80–97)
MID (cbc): 0.4 (ref 0–0.9)
MPV: 8.3 fL (ref 0–99.8)
POC Granulocyte: 3.5 (ref 2–6.9)
POC LYMPH PERCENT: 33.2 %L (ref 10–50)
POC MID %: 6.8 %M (ref 0–12)
Platelet Count, POC: 272 10*3/uL (ref 142–424)
RBC: 5.05 M/uL (ref 4.69–6.13)
RDW, POC: 13 %
WBC: 5.9 10*3/uL (ref 4.6–10.2)

## 2013-04-23 LAB — GLUCOSE, POCT (MANUAL RESULT ENTRY): POC Glucose: 88 mg/dl (ref 70–99)

## 2013-04-23 NOTE — Progress Notes (Signed)
Subjective:  This chart was scribed for Nilda Simmer, MD by Carl Best, Medical Scribe. This patient was seen in Room 8 and the patient's care was started at 10:17 AM.   Patient ID: Jake [name], male    DOB: 1961/05/03, 52 y.o.   MRN: [id number]  HPI HPI Comments: Jake [name] is a 52 y.o. male who presents to the [facility] complaining of frequent chest pressure, intermittent stabbing chest pain that feels different from asthmatic chest pain, constant, pressurized neck pain radiating to his left bicep, headache, and an altered mental status that started yesterday.  He states that he took half a dose of Hydrocodone yesterday morning at 7 AM before he dropped his son off at the bus stop for a wrestling match in [place].  The patient states that he started was not feeling "too calm" yesterday morning at 10:30 AM.  He states that while driving to [facility] yesterday and had a brief moment where he thought he was going to experience a syncopal episode.  He states that he started feeling dizzy and warm at 2 PM during the wrestling match.  He denies the gym being hot.  He states that when he returned home he felt like his reaction time was slower than normal, decreased appetite, and agitation.  He states that he relaxed for the rest of the evening and fell asleep at 12:30 AM.  He denies feeling confused or disoriented at the time of his AMS incident.    The patient states that he will experience 5-6 episodes of intermittent chest pain lasting for a couple of seconds.  He rates the chest pain at a 5/10 at its worst and states that it is aggravated with deep breathing.  He states that he is unsure as to whether or not his neck pain and chest pain are associated.  He states that nothing aggravates or alleviates his chest pressure.  He rates the chest pressure at a 2-3/10 at its worst.      He denies numbness and tingling in his left arm, diaphoresis, and sore throat as associated  symptoms.  The patient states that he has a history of neck surgery which has caused constant numbness and tingling in his right arm.    He states that he took Zantac last night and 3 baby Aspirin last night and this morning with his yogurt.  He states that he takes 1 dose of muscle relaxers at bed time. He states that he normally takes a tablet or half a tablet of Hydrocodone every 3-5 days for his back pain.  He states that he has an appointment with his PCP in a couple of weeks to obtain blood work.   The patient was last seen at [facility] on April 12, 2013 for similar symptoms and states that the chest pressure has occurred more frequently in the last 11 days.  He states that he has been taking his albuterol inhaler and nasal spray which has helped alleviate his SOB that he was experiencing during his last visit.  He states that since his last visit, he can now move his neck without any aggravation of his pain but states that he feels as though his skin is "stretching".  He states that he has been exercising since his last visit and denies any worsening in his chest pressure while exercising.     Review of Systems  Constitutional: Positive for fatigue. Negative for diaphoresis.  HENT: Negative for sore  throat.   Cardiovascular: Positive for chest pain.  Musculoskeletal: Positive for neck pain and neck stiffness.  Neurological: Positive for dizziness and headaches. Negative for syncope, weakness and numbness.  Psychiatric/Behavioral: Positive for behavioral problems and agitation. Negative for hallucinations, confusion, dysphoric mood and decreased concentration.  All other systems reviewed and are negative.   Past Medical History  Diagnosis Date  . Allergy   . Asthma   . GERD (gastroesophageal reflux disease)    Past Surgical History  Procedure Laterality Date  . Lumbar laminectomy  1997  . Neck surgery  01/2010    c5-7  . Inguinal hernia repair  2009   Allergies  Allergen  Reactions  . Other Rash    Wool pants caused rash   . Latex     REACTION: RASH   History   Social History  . Marital Status: Married    Spouse Name: Jake [name]    Number of Children: 4  . Years of Education: BA   Occupational History  . [employer]    Social History Main Topics  . Smoking status: Former Smoker -- 0.50 packs/day for 20 years    Types: Cigarettes    Quit date: 08/19/1991  . Smokeless tobacco: Never Used  . Alcohol Use: Yes     Comment: 5-7 DRINKS WEEKLY  . Drug Use: No  . Sexual Activity: Not on file   Other Topics Concern  . Not on file   Social History Narrative   Patient is married Jake [name]) and has four children.   Patient lives at home with his wife and children.   Patient is working  At [employer].   Patient has a BA degree.   Patient used both hands.   Patient drinks three cups of coffee daily.   Family History  Problem Relation Age of Onset  . Alcohol abuse    . Asthma    . Depression    . Valvular heart disease    . Osteoporosis Mother   . Parkinson's disease Father      Objective:  Physical Exam  Nursing note and vitals reviewed. Constitutional: He is oriented to person, place, and time. He appears well-developed and well-nourished. No distress.  HENT:  Head: Normocephalic and atraumatic.  Right Ear: External ear normal.  Left Ear: External ear normal.  Nose: Nose normal.  Mouth/Throat: Oropharynx is clear and moist. No oropharyngeal exudate.  Eyes: Conjunctivae and EOM are normal. Pupils are equal, round, and reactive to light.  Neck: Normal range of motion and full passive range of motion without pain. Neck supple. No tracheal deviation present. No thyromegaly present.  Sternolclydomastoid tenderness and tenderness to palpation around left lateral neck  Cardiovascular: Normal rate, regular rhythm and normal heart sounds.  Exam reveals no gallop and no friction rub.   No murmur heard. Pulmonary/Chest: Effort  normal and breath sounds normal. No respiratory distress. He has no wheezes. He has no rales. He exhibits no tenderness and no bony tenderness.  Musculoskeletal: Normal range of motion. He exhibits no edema.       Right shoulder: Normal. He exhibits normal range of motion.       Left shoulder: Normal. He exhibits normal range of motion.       Cervical back: Normal. He exhibits normal range of motion (mild pain reproduced).  Lymphadenopathy:    He has no cervical adenopathy.  Neurological: He is alert and oriented to person, place, and time.  Skin: Skin is  warm and dry.  Psychiatric: He has a normal mood and affect. His behavior is normal.    EKG read and interpreted by Dr. Katrinka Blazing: Sinus bradycardia with a rate of 58.  No acute ST changes.  Results for orders placed in visit on 04/23/13  POCT CBC      Result Value Ref Range   WBC 5.9  4.6 - 10.2 K/uL   Lymph, poc 2.0  0.6 - 3.4   POC LYMPH PERCENT 33.2  10 - 50 %L   MID (cbc) 0.4  0 - 0.9   POC MID % 6.8  0 - 12 %M   POC Granulocyte 3.5  2 - 6.9   Granulocyte percent 60.0  37 - 80 %G   RBC 5.05  4.69 - 6.13 M/uL   Hemoglobin 15.4  14.1 - 18.1 g/dL   HCT, POC 16.1  09.6 - 53.7 %   MCV 95.8  80 - 97 fL   MCH, POC 30.5  27 - 31.2 pg   MCHC 31.8  31.8 - 35.4 g/dL   RDW, POC 04.5     Platelet Count, POC 272  142 - 424 K/uL   MPV 8.3  0 - 99.8 fL  GLUCOSE, POCT (MANUAL RESULT ENTRY)      Result Value Ref Range   POC Glucose 88  70 - 99 mg/dl      Assessment & Plan:   1. Chest pain   2. Lightheaded   3. DDD (degenerative disc disease), cervical    1. Chest pain: atypical in nature; stable EKG; refer to cardiology to rule out cardiac etiology.  To ED for acutely worsening symptoms. 3.  Lightheaded:  New. Associated with near syncope, chest pain, mental status changes. Occurred after taking hydrocodone, muscle relaxer.  Obtain labs.  Normal neurological exam in office; stable EKG. 4.  DDD cervical spine: with recent neck pain and  tenderness on cervical spine exam.  Recommend heat, stretches.  If pain worsens, may warrant follow-up with ortho.   No orders of the defined types were placed in this encounter.   I personally performed the services described in this documentation, which was scribed in my presence.  The recorded information has been reviewed and is accurate.  Nilda Simmer, M.D.  [facility] [address] [place], [place]  [address] [phone] phone [phone] fax

## 2013-04-24 ENCOUNTER — Ambulatory Visit (INDEPENDENT_AMBULATORY_CARE_PROVIDER_SITE_OTHER): Payer: BC Managed Care – PPO | Admitting: Cardiology

## 2013-04-24 ENCOUNTER — Encounter: Payer: Self-pay | Admitting: Cardiology

## 2013-04-24 ENCOUNTER — Encounter: Payer: Self-pay | Admitting: *Deleted

## 2013-04-24 VITALS — BP 130/82 | HR 84 | Ht 70.0 in | Wt 180.0 lb

## 2013-04-24 DIAGNOSIS — R079 Chest pain, unspecified: Secondary | ICD-10-CM

## 2013-04-24 DIAGNOSIS — R072 Precordial pain: Secondary | ICD-10-CM | POA: Insufficient documentation

## 2013-04-24 LAB — BASIC METABOLIC PANEL
BUN: 17 mg/dL (ref 6–23)
CO2: 29 mEq/L (ref 19–32)
Calcium: 9.2 mg/dL (ref 8.4–10.5)
Chloride: 102 mEq/L (ref 96–112)
Creatinine, Ser: 1 mg/dL (ref 0.4–1.5)
GFR: 84.03 mL/min (ref >60.00)
Glucose, Bld: 97 mg/dL (ref 70–99)
Potassium: 3.6 mEq/L (ref 3.5–5.1)
Sodium: 138 mEq/L (ref 135–145)

## 2013-04-24 NOTE — Patient Instructions (Signed)
**Note De-Identified [name]** Your physician has requested that you have cardiac CT. Cardiac computed tomography (CT) is a painless test that uses an x-ray machine to take clear, detailed pictures of your heart. For further information please visit [email or web address] Please follow instruction sheet as given.  Your physician recommends that you schedule a follow-up appointment in: 1 week

## 2013-04-24 NOTE — Progress Notes (Signed)
Patient ID: Jake [name], male   DOB: [birth year], 52 y.o.   MRN: [id number]     Patient Name: Jake [name] Date of Encounter: 04/24/2013  Primary Care Provider:  Evette Georges, MD Primary Cardiologist:  Tobias Alexander, H   Problem List   Past Medical History  Diagnosis Date  . Allergy   . Asthma   . GERD (gastroesophageal reflux disease)    Past Surgical History  Procedure Laterality Date  . Lumbar laminectomy    . Neck surgery      c5-7    Allergies  Allergies  Allergen Reactions  . Latex     REACTION: RASH    HPI  Jake [name] is a 52 y.o. male who in the last two weeks presented to the ER twice for episodes of chest pain tthat are retrosternal, sharp, lasting 5-10 seconds and are sometimes associated with exertion. There is no clear exxacerbating or alleviating factor. It started when the patient had asthma flare up, but it has been treated and his pain continue to happen on daily basis.  The pain is associated with SOB, headaches and radiates to the neck and left arm. No relief with change of position.  He states that while driving to [facility] yesterday and had a brief moment where he thought he was going to experience a syncopal episode. He is very anxious about it.  The patient denies association with food intake. He denies hypertension, hyperlipidemia, diabetes or family h/o CAD, he quit smoking 20 years ago.  Home Medications  Prior to Admission medications   Medication Sig Start Date End Date Taking? Authorizing Provider  acetaminophen (TYLENOL) 325 MG tablet Take 650 mg by mouth every 6 (six) hours as needed.      Historical Provider, MD  albuterol (VENTOLIN HFA) 108 (90 BASE) MCG/ACT inhaler Inhale 2 puffs into the lungs as needed. 04/12/13   Ethelda Chick, MD  beclomethasone (BECONASE AQ) 42 MCG/SPRAY nasal spray Place 2 sprays into both nostrils 2 (two) times daily. Dose is for each nostril. 04/13/13   Roderick Pee, MD  fluticasone  (FLOVENT HFA) 44 MCG/ACT inhaler Inhale 1 puff into the lungs 2 (two) times daily. 04/12/13   Ethelda Chick, MD  HYDROcodone-acetaminophen (VICODIN) 5-500 MG per tablet Take 5-500 mg by mouth as needed. 01/28/12   Historical Provider, MD  ibuprofen (ADVIL,MOTRIN) 400 MG tablet Take 400 mg by mouth every 6 (six) hours as needed.      Historical Provider, MD  loratadine (CLARITIN) 10 MG tablet Take 10 mg by mouth daily.    Historical Provider, MD  ranitidine (ZANTAC) 150 MG capsule Take 150 mg by mouth 2 (two) times daily.      Historical Provider, MD    Family History  Family History  Problem Relation Age of Onset  . Alcohol abuse    . Asthma    . Depression    . Valvular heart disease      Social History  History   Social History  . Marital Status: Married    Spouse Name: N/A    Number of Children: N/A  . Years of Education: N/A   Occupational History  . Not on file.   Social History Main Topics  . Smoking status: Never Smoker   . Smokeless tobacco: Never Used  . Alcohol Use: No  . Drug Use: No  . Sexual Activity: Not on file   Other Topics Concern  . Not on file  Social History Narrative  . No narrative on file     Review of Systems, as per HPI, otherwise negative General:  No chills, fever, night sweats or weight changes.  Cardiovascular:  No chest pain, dyspnea on exertion, edema, orthopnea, palpitations, paroxysmal nocturnal dyspnea. Dermatological: No rash, lesions/masses Respiratory: No cough, dyspnea Urologic: No hematuria, dysuria Abdominal:   No nausea, vomiting, diarrhea, bright red blood per rectum, melena, or hematemesis Neurologic:  No visual changes, wkns, changes in mental status. All other systems reviewed and are otherwise negative except as noted above.  Physical Exam  BP 130/82, HR 84 BPM General: Pleasant, NAD Psych: Normal affect. Neuro: Alert and oriented X 3. Moves all extremities spontaneously. HEENT: Normal  Neck: Supple without  bruits or JVD. Lungs:  Resp regular and unlabored, CTA. Heart: RRR no s3, s4, or murmurs. Abdomen: Soft, non-tender, non-distended, BS + x 4.  Extremities: No clubbing, cyanosis or edema. DP/PT/Radials 2+ and equal bilaterally.  Labs:  No results found for this basename: CKTOTAL, CKMB, TROPONINI,  in the last 72 hours Lab Results  Component Value Date   WBC 5.9 04/23/2013   HGB 15.4 04/23/2013   HCT 48.4 04/23/2013   MCV 95.8 04/23/2013   PLT 252.0 08/13/2011   No results found for this basename: NA, K, CL, CO2, BUN, CREATININE, CALCIUM, LABALBU, PROT, BILITOT, ALKPHOS, ALT, AST, GLUCOSE,  in the last 168 hours Lab Results  Component Value Date   CHOL 216* 08/13/2011   HDL 58.30 08/13/2011   TRIG 81.0 08/13/2011   Accessory Clinical Findings  Echocardiogram - none  ECG - sinus bradycardia, 53 beats per minutes, normal EKG.   Assessment & Plan  52 year old male with   1. Chest pain associated with SOB and dizziness. They are somewhat atypical, but have some typical features as radiation to the neck and left arm. A stress test is not ideal as he doesn't have exertional chest pain. We should consider Prinzmetal angina or myocardial bridging in the differential. We will order a cardiac CT to rule to evaluate for CAD and possible bridging. We will order a BMP to check for kidney function.  We will follow after the test.  2. Blood pressure - normal on no medication  3. Lipid profile - followed by his PCP, LDL inknown, HDL and TAG normal in 07/2011  Tobias Alexander, Rexene Edison, MD, [facility] 04/24/2013, 2:56 PM

## 2013-04-25 ENCOUNTER — Other Ambulatory Visit (HOSPITAL_COMMUNITY): Payer: BC Managed Care – PPO

## 2013-04-26 ENCOUNTER — Ambulatory Visit (HOSPITAL_COMMUNITY)
Admission: RE | Admit: 2013-04-26 | Discharge: 2013-04-26 | Disposition: A | Discharge status: Home / Self Care | Payer: BC Managed Care – PPO | Source: Ambulatory Visit | Attending: Cardiology | Admitting: Cardiology

## 2013-04-26 DIAGNOSIS — R911 Solitary pulmonary nodule: Secondary | ICD-10-CM | POA: Insufficient documentation

## 2013-04-26 DIAGNOSIS — R079 Chest pain, unspecified: Secondary | ICD-10-CM

## 2013-04-26 DIAGNOSIS — I2510 Atherosclerotic heart disease of native coronary artery without angina pectoris: Secondary | ICD-10-CM | POA: Insufficient documentation

## 2013-04-26 MED ORDER — NITROGLYCERIN 0.4 MG SL SUBL
SUBLINGUAL_TABLET | SUBLINGUAL | Status: AC
  Filled 2013-04-26: qty 25

## 2013-04-26 MED ORDER — IOHEXOL 350 MG/ML SOLN
80.0000 mL | Freq: Once | INTRAVENOUS | Status: AC | PRN
  Administered 2013-04-26: 80 mL via INTRAVENOUS

## 2013-04-26 MED ORDER — METOPROLOL TARTRATE 1 MG/ML IV SOLN
INTRAVENOUS | Status: AC
  Administered 2013-04-26: 5 mg
  Filled 2013-04-26: qty 15

## 2013-05-01 ENCOUNTER — Other Ambulatory Visit: Payer: Self-pay

## 2013-05-01 DIAGNOSIS — E785 Hyperlipidemia, unspecified: Secondary | ICD-10-CM

## 2013-05-01 MED ORDER — ATORVASTATIN CALCIUM 20 MG PO TABS: 20.0000 mg | ORAL_TABLET | Freq: Every day | ORAL | Status: DC

## 2013-05-01 MED ORDER — ASPIRIN EC 81 MG PO TBEC: 81.0000 mg | DELAYED_RELEASE_TABLET | Freq: Every day | ORAL | Status: DC

## 2013-05-03 ENCOUNTER — Encounter: Payer: Self-pay | Admitting: Cardiology

## 2013-05-03 ENCOUNTER — Other Ambulatory Visit: Payer: Self-pay | Admitting: *Deleted

## 2013-05-03 ENCOUNTER — Ambulatory Visit (INDEPENDENT_AMBULATORY_CARE_PROVIDER_SITE_OTHER): Payer: BC Managed Care – PPO | Admitting: Cardiology

## 2013-05-03 VITALS — BP 129/80 | HR 93 | Ht 70.0 in | Wt 179.0 lb

## 2013-05-03 DIAGNOSIS — M542 Cervicalgia: Secondary | ICD-10-CM | POA: Insufficient documentation

## 2013-05-03 DIAGNOSIS — R42 Dizziness and giddiness: Secondary | ICD-10-CM | POA: Insufficient documentation

## 2013-05-03 DIAGNOSIS — I2510 Atherosclerotic heart disease of native coronary artery without angina pectoris: Secondary | ICD-10-CM | POA: Insufficient documentation

## 2013-05-03 DIAGNOSIS — R079 Chest pain, unspecified: Secondary | ICD-10-CM | POA: Insufficient documentation

## 2013-05-03 NOTE — Patient Instructions (Signed)
You have been referred to [facility]ID CLINIC WITH [facility]  Your physician has requested that you have a carotid duplex. This test is an ultrasound of the carotid arteries in your neck. It looks at blood flow through these arteries that supply the brain with blood. Allow one hour for this exam. There are no restrictions or special instructions.  Your physician recommends that you schedule a follow-up appointment in: WITH DR. Delton See AFTER CAROTID DUPLEX  Your physician recommends that you continue on your current medications as directed. Please refer to the Current Medication list given to you today.

## 2013-05-03 NOTE — Progress Notes (Signed)
Patient ID: SCOUT GUMBS, male   DOB: May 17, 1961, 52 y.o.   MRN: [id number]     Patient Name: Jake [name] Date of Encounter: 05/03/2013  Primary Care Provider:  Evette Georges, MD Primary Cardiologist:  Tobias Alexander, H   Problem List   Past Medical History  Diagnosis Date  . Allergy   . Asthma   . GERD (gastroesophageal reflux disease)    Past Surgical History  Procedure Laterality Date  . Lumbar laminectomy    . Neck surgery      c5-7    Allergies  Allergies  Allergen Reactions  . Latex     REACTION: RASH    HPI  Jake [name] is a 52 y.o. male who in the last two weeks presented to the ER twice for episodes of chest pain tthat are retrosternal, sharp, lasting 5-10 seconds and are sometimes associated with exertion. There is no clear exacerbating or alleviating factor. It started when the patient had asthma flare up, but it has been treated and his pain continue to happen on daily basis.  The pain is associated with SOB, headaches and radiates to the neck and left arm. No relief with change of position.  He states that while driving to [place] 2 weeks ago he had a brief moment where he thought he was going to experience a syncopal episode. He is very anxious about it.  The patient denies association with food intake. He denies hypertension, hyperlipidemia, diabetes or family h/o CAD, he quit smoking 20 years ago.  Today he is coming after two weeks. He complains of necks tingling and pain as well as of intermittent pain at the bilateral temporal area.  Home Medications  Prior to Admission medications   Medication Sig Start Date End Date Taking? Authorizing Provider  acetaminophen (TYLENOL) 325 MG tablet Take 650 mg by mouth every 6 (six) hours as needed.      Historical Provider, MD  albuterol (VENTOLIN HFA) 108 (90 BASE) MCG/ACT inhaler Inhale 2 puffs into the lungs as needed. 04/12/13   Ethelda Chick, MD  beclomethasone (BECONASE AQ) 42 MCG/SPRAY  nasal spray Place 2 sprays into both nostrils 2 (two) times daily. Dose is for each nostril. 04/13/13   Roderick Pee, MD  fluticasone (FLOVENT HFA) 44 MCG/ACT inhaler Inhale 1 puff into the lungs 2 (two) times daily. 04/12/13   Ethelda Chick, MD  HYDROcodone-acetaminophen (VICODIN) 5-500 MG per tablet Take 5-500 mg by mouth as needed. 01/28/12   Historical Provider, MD  ibuprofen (ADVIL,MOTRIN) 400 MG tablet Take 400 mg by mouth every 6 (six) hours as needed.      Historical Provider, MD  loratadine (CLARITIN) 10 MG tablet Take 10 mg by mouth daily.    Historical Provider, MD  ranitidine (ZANTAC) 150 MG capsule Take 150 mg by mouth 2 (two) times daily.      Historical Provider, MD    Family History  Family History  Problem Relation Age of Onset  . Alcohol abuse    . Asthma    . Depression    . Valvular heart disease      Social History  History   Social History  . Marital Status: Married    Spouse Name: N/A    Number of Children: N/A  . Years of Education: N/A   Occupational History  . Not on file.   Social History Main Topics  . Smoking status: Never Smoker   . Smokeless tobacco: Never Used  .  Alcohol Use: No  . Drug Use: No  . Sexual Activity: Not on file   Other Topics Concern  . Not on file   Social History Narrative  . No narrative on file     Review of Systems, as per HPI, otherwise negative General:  No chills, fever, night sweats or weight changes.  Cardiovascular:  No chest pain, dyspnea on exertion, edema, orthopnea, palpitations, paroxysmal nocturnal dyspnea. Dermatological: No rash, lesions/masses Respiratory: No cough, dyspnea Urologic: No hematuria, dysuria Abdominal:   No nausea, vomiting, diarrhea, bright red blood per rectum, melena, or hematemesis Neurologic:  No visual changes, wkns, changes in mental status. All other systems reviewed and are otherwise negative except as noted above.  Physical Exam  BP 129/80, HR 93 BPM General:  Pleasant, NAD Psych: Normal affect. Neuro: Alert and oriented X 3. Moves all extremities spontaneously. HEENT: Normal  Neck: Supple without bruits or JVD. Lungs:  Resp regular and unlabored, CTA. Heart: RRR no s3, s4, or murmurs. Abdomen: Soft, non-tender, non-distended, BS + x 4.  Extremities: No clubbing, cyanosis or edema. DP/PT/Radials 2+ and equal bilaterally.  Labs:  No results found for this basename: CKTOTAL, CKMB, TROPONINI,  in the last 72 hours Lab Results  Component Value Date   WBC 5.9 04/23/2013   HGB 15.4 04/23/2013   HCT 48.4 04/23/2013   MCV 95.8 04/23/2013   PLT 252.0 08/13/2011   No results found for this basename: NA, K, CL, CO2, BUN, CREATININE, CALCIUM, LABALBU, PROT, BILITOT, ALKPHOS, ALT, AST, GLUCOSE,  in the last 168 hours Lab Results  Component Value Date   CHOL 216* 08/13/2011   HDL 58.30 08/13/2011   TRIG 81.0 08/13/2011   Accessory Clinical Findings  Echocardiogram - none  ECG - sinus bradycardia, 53 beats per minutes, normal EKG.   Coronary CT 04/26/2013  Coronary arteries:  Left main is a large vessel with minimal non-calcified plague with 0-25 % stenosis. Left main gives rise to LAD, ramus intermedium and LCx artery.  LAD is a large vessel with a mid plague in its ostial portion with associated 25-50% stenosis. There are 2 other calcified plagues in the proximal LAD with associated 25-50% stenosis. LAD only has 0-25% stenosis in its mid and distal segment. First diagonal vessel is rather small and only has mild luminal irregularities.  RI is moderate caliber vessel that further divides into two vessels and gives rise to the first septal branch. These two ramuses supply the entire anterolateral wall and have only mild luminal irregularities.  LCx is a small to moderate caliber non-dominant vessel that gives rise to one very small obtuse marginal branch. There is mild non-calcified plague in the proximal LCx artery associated  with 0-25% stenosis.  RCA is a large dominant vessel that gives rise to PDA and PLVB. RCA has a mild calcified plague that is associated with 0-25% stenosis. The rest of RCA has only luminal irregularities. PDA is a large vessel that runs all the way to the apex. There are only luminal irregularities. PLVB is a small vessel without significant stenosis.  Pericardium: Normal  IMPRESSION: 1. Coronary calcium score of 39.82. This was 51 percentile for age and sex matched control.  2. Non-obstructive CAD. Intense medical management is recommended.  Tobias Alexander     Assessment & Plan  52 year old male with   1. Chest pain associated with SOB and dizziness. They are somewhat atypical, but have some typical features as radiation to the neck and left arm.  A stress test wasn't considered appropriate as he doesn't have exertional chest pain.  The patient underwent a coronary CT on 04/26/2013. His calcium score (40) place him into 80 percentile when matched for age and sex.  There was a non-obstructive plague in all three coronary arteries, most significant was the mixed plague in the proximal LAD associated with 25-50% stenosis. The patient was shown those images upon his request.  The patient falls into high risk category with proven CAD and should be placed on at least moderate dose of highly efficient statin. The patient is very educated and very hesitant. We had a long discussion about benefits and mechanism of statin therapy. The patient would like to be referred to the lipid clinic and consider alternative therapy like PCSK 9 inhibitors. He has never tried statins and has no bad experience yet. We don't have his cholesterol levels but they were drawn at his PCP. He will bring them to the lipid clinic. Regardless, I believe he should be on a statin therapy regardless of cholesterol level.   2. Neck and temporal pain, we will order B/L carotis [place], as there is concern about vertebral  artery damage after cervical spine surgery. Temporal arteritis is low on differential considering his age, lack of visual changes and no leukocytosis.  3. Blood pressure - normal on no medication  4. Lipid profile - followed by his PCP, LDL inknown, HDL and TAG normal in 07/2011  Follow up in 2 months.  Total time spent with the patient was 1 hour (including showing the CT images)  Lars Masson, MD, [facility] 05/03/2013, 4:36 PM

## 2013-05-04 ENCOUNTER — Ambulatory Visit (INDEPENDENT_AMBULATORY_CARE_PROVIDER_SITE_OTHER): Payer: BC Managed Care – PPO | Admitting: Family Medicine

## 2013-05-04 ENCOUNTER — Encounter: Payer: Self-pay | Admitting: Family Medicine

## 2013-05-04 VITALS — BP 132/80 | Temp 98.0°F | Wt 180.0 lb

## 2013-05-04 DIAGNOSIS — K644 Residual hemorrhoidal skin tags: Secondary | ICD-10-CM | POA: Insufficient documentation

## 2013-05-04 DIAGNOSIS — R079 Chest pain, unspecified: Secondary | ICD-10-CM

## 2013-05-04 DIAGNOSIS — J45909 Unspecified asthma, uncomplicated: Secondary | ICD-10-CM

## 2013-05-04 DIAGNOSIS — M542 Cervicalgia: Secondary | ICD-10-CM

## 2013-05-04 NOTE — Progress Notes (Signed)
Pre visit review using our clinic review tool, if applicable. No additional management support is needed unless otherwise documented below in the visit note. 

## 2013-05-04 NOTE — Patient Instructions (Signed)
Call the allergy and asthma Center and set up a consult  I will put in requests for a neurologic consult   followup with cardiology    For the next 2 weeks take a stool softener to allow the hemorrhoid to heal

## 2013-05-04 NOTE — Progress Notes (Signed)
   Subjective:    Patient ID: Jake [name], male    DOB: 11/02/1960, 52 y.o.   MRN: [id number]  HPI Jake [name] is a 52 year old male who comes in today for evaluation of multiple issues  He says he's had an evaluation in the past at the asthma and allergy Center was told he had asthma and was placed on an inhaled steroid. He quit taking it and this past summer he noticed a flare of his asthma. He went to an urgent care and was given Flovent which he been taking one puff twice a day. He takes albuterol when necessary  He also went [facility] from twice for chest pain. Workup x2 was negative. He recently saw her cardiologist who did an evaluation and found he had some nonocclusive coronary disease and is recommended maximum medical therapy however he declines to take a statin.  He would also like to see a neurologist about his neck pain. He's had a history of cervical disc disease. And surgery  He also has a lump in his rectum for one week has not been bleeding or very painful.   Review of Systems Review of systems otherwise negative    Objective:   Physical Exam Well-developed and nourished male no acute distress vital signs stable he is afebrile examination of the rectum shows a very 2 mm to 3 mm small external hemorrhoid       Assessment & Plan:  External hemorrhoids small treat with stool softeners  Chest pain,,,,,,,,, actually his chest wall pain. Not coronary pain  Underlying coronary disease maximum medical therapy as outlined by cardiology  Neck pain referred back to neurology  Asthma refer back to allergy and asthma Center

## 2013-05-05 ENCOUNTER — Other Ambulatory Visit: Payer: BC Managed Care – PPO

## 2013-05-08 ENCOUNTER — Other Ambulatory Visit (INDEPENDENT_AMBULATORY_CARE_PROVIDER_SITE_OTHER): Payer: BC Managed Care – PPO

## 2013-05-08 DIAGNOSIS — E785 Hyperlipidemia, unspecified: Secondary | ICD-10-CM

## 2013-05-08 LAB — COMPREHENSIVE METABOLIC PANEL
ALT: 18 U/L (ref 0–53)
AST: 20 U/L (ref 0–37)
Albumin: 4.4 g/dL (ref 3.5–5.2)
Alkaline Phosphatase: 58 U/L (ref 39–117)
BUN: 12 mg/dL (ref 6–23)
CO2: 30 mEq/L (ref 19–32)
Calcium: 9 mg/dL (ref 8.4–10.5)
Chloride: 104 mEq/L (ref 96–112)
Creatinine, Ser: 1 mg/dL (ref 0.4–1.5)
GFR: 85 mL/min (ref >60.00)
Glucose, Bld: 92 mg/dL (ref 70–99)
Potassium: 3.8 mEq/L (ref 3.5–5.1)
Sodium: 140 mEq/L (ref 135–145)
Total Bilirubin: 0.6 mg/dL (ref 0.3–1.2)
Total Protein: 7.3 g/dL (ref 6.0–8.3)

## 2013-05-08 LAB — LIPID PANEL
Cholesterol: 203 mg/dL — ABNORMAL HIGH (ref 0–200)
HDL: 44.4 mg/dL (ref >39.00)
Total CHOL/HDL Ratio: 5
Triglycerides: 136 mg/dL (ref 0.0–149.0)
VLDL: 27.2 mg/dL (ref 0.0–40.0)

## 2013-05-08 LAB — LDL CHOLESTEROL, DIRECT: Direct LDL: 135 mg/dL

## 2013-05-09 LAB — LIPOPROTEIN A (LPA): Lipoprotein (a): <5 mg/dL (ref 0–30)

## 2013-05-15 ENCOUNTER — Ambulatory Visit (INDEPENDENT_AMBULATORY_CARE_PROVIDER_SITE_OTHER): Payer: BC Managed Care – PPO | Admitting: Neurology

## 2013-05-15 ENCOUNTER — Encounter (INDEPENDENT_AMBULATORY_CARE_PROVIDER_SITE_OTHER): Payer: Self-pay

## 2013-05-15 ENCOUNTER — Encounter: Payer: Self-pay | Admitting: Neurology

## 2013-05-15 VITALS — BP 137/94 | HR 76 | Ht 69.34 in | Wt 182.0 lb

## 2013-05-15 DIAGNOSIS — R55 Syncope and collapse: Secondary | ICD-10-CM

## 2013-05-15 DIAGNOSIS — G459 Transient cerebral ischemic attack, unspecified: Secondary | ICD-10-CM

## 2013-05-15 NOTE — Progress Notes (Addendum)
[facility]    Provider:  Dr Hosie Poisson Referring Provider: Roderick Pee, MD Primary Care Physician:  Evette Georges, MD  CC: "I feel like I am going to pass out"  HPI:  Jake [name] is a 52 y.o. male here as a referral from Dr. Tawanna Cooler for neck pain and concern of possible pre-syncopal episodes.   For the past few months he notes sensations of light headed, feeling like he will pass out, feeling very fatigued. No vertigo episodes related with this.Scheduled to have a carotid doppler tomorrow. This is questionably related to turning head to the left but unsure if overall triggered by a certain position but can also be related to going from sitting to standing. Lasts just a few seconds, doesn't need to sit down to feel better. Has sensation of head pain/itching sensation in the L occpital and temporal region but denies any other current headache related symptoms. No automatisms during these events. No visual changes graying out or blacking out of vision. Denies any palpitations associated with these events. These events appear to be stereotyped.   Has been having intermittent chest pain, told 25% blockage in LAD. Told to get carotid doppler, consider statin but he is seeing someone for a statin alternative, scheduled to see pharmacist [facility]. No SOB, no diaphoresis associated with the chest pain. Lasts a few seconds.   History of cervical surgery in 2011, had anterior disc fusion. Since then has had intermittent paresthesias in his fingers but this appears to be a different type pain.Denies any other concerns with neck pain, main concern at this time is the above issue. No prior strokes, no TIAs.   Review of Systems: Out of a complete 14 system review, the patient complains of only the following symptoms, and all other reviewed systems are negative. Positive for headache dizziness waking chest pain ringing in ears spinning sensation allergy  History   Social  History  . Marital Status: Married    Spouse Name: Pollyann Glen    Number of Children: 4  . Years of Education: BA   Occupational History  . Not on file.   Social History Main Topics  . Smoking status: Never Smoker   . Smokeless tobacco: Never Used  . Alcohol Use: Yes     Comment: 5-7 DRINKS WEEKLY  . Drug Use: No  . Sexual Activity: Not on file   Other Topics Concern  . Not on file   Social History Narrative   Patient is married Pollyann Glen) and has four children.   Patient lives at home with his wife and children.   Patient is working  At [employer].   Patient has a BA degree.   Patient used both hands.   Patient drinks three cups of coffee daily.    Family History  Problem Relation Age of Onset  . Alcohol abuse    . Asthma    . Depression    . Valvular heart disease      Past Medical History  Diagnosis Date  . Allergy   . Asthma   . GERD (gastroesophageal reflux disease)     Past Surgical History  Procedure Laterality Date  . Lumbar laminectomy  1997  . Neck surgery  01/2010    c5-7  . Inguinal hernia repair  2009    Current Outpatient Prescriptions  Medication Sig Dispense Refill  . acetaminophen (TYLENOL) 325 MG tablet Take 500 mg by mouth every 6 (six) hours as needed.       Marland Kitchen  albuterol (VENTOLIN HFA) 108 (90 BASE) MCG/ACT inhaler Inhale 2 puffs into the lungs as needed.  18 g  2  . aspirin EC 81 MG tablet Take 1 tablet (81 mg total) by mouth daily.  90 tablet  3  . atorvastatin (LIPITOR) 20 MG tablet       . beclomethasone (BECONASE AQ) 42 MCG/SPRAY nasal spray Place 2 sprays into both nostrils 2 (two) times daily. Dose is for each nostril.  25 g  0  . fluticasone (FLOVENT HFA) 44 MCG/ACT inhaler Inhale 1 puff into the lungs 2 (two) times daily.  3 Inhaler  4  . HYDROcodone-acetaminophen (VICODIN) 5-500 MG per tablet Take 5-325 mg by mouth as needed.       Marland Kitchen ibuprofen (ADVIL,MOTRIN) 400 MG tablet Take 400 mg by mouth every 6 (six) hours as  needed.        . loratadine (CLARITIN) 10 MG tablet Take 10 mg by mouth daily.      . ranitidine (ZANTAC) 150 MG capsule Take 150 mg by mouth 2 (two) times daily.         No current facility-administered medications for this visit.    Allergies as of 05/15/2013 - Review Complete 05/04/2013  Allergen Reaction Noted  . Latex  03/01/2007    Vitals: BP 137/94  Pulse 76  Ht 5' 9.34" (1.761 m)  Wt 182 lb (82.555 kg)  BMI 26.62 kg/m2 Last Weight:  Wt Readings from Last 1 Encounters:  05/15/13 182 lb (82.555 kg)   Last Height:   Ht Readings from Last 1 Encounters:  05/15/13 5' 9.34" (1.761 m)     Physical exam: Exam: Gen: NAD, conversant Eyes: anicteric sclerae, moist conjunctivae HENT: Atraumatic, oropharynx clear Neck: Trachea midline; supple,  Lungs: CTA, no wheezing, rales, rhonic                          CV: RRR, no MRG, no carotid bruits Abdomen: Soft, non-tender;  Extremities: No peripheral edema  Skin: Normal temperature, no rash,  Psych: Appropriate affect, pleasant  Neuro: MS: AA&Ox3, appropriately interactive, normal affect   Speech: fluent w/o paraphasic error  Memory: good recent and remote recall  CN: PERRL, EOMI no nystagmus, no ptosis, sensation intact to LT V1-V3 bilat, face symmetric, no weakness, hearing grossly intact, palate elevates symmetrically, shoulder shrug 5/5 bilat,  tongue protrudes midline, no fasiculations noted.  Motor: normal bulk and tone Strength: 5/5  In all extremities  Coord: rapid alternating and point-to-point (FNF, HTS) movements intact.  Reflexes: symmetrical, bilat downgoing toes  Sens: LT intact in all extremities  Gait: posture, stance, stride and arm-swing normal. Tandem gait intact. Able to walk on heels and toes. Romberg absent.   Assessment:  After physical and neurologic examination, review of laboratory studies, imaging, neurophysiology testing and pre-existing records, assessment will be reviewed on the  problem list.  Plan:  Treatment plan and additional workup will be reviewed under Problem List.  1)Dizziness 2)Chronic cervical neck pain  Mr. Geers is a pleasant 52 year old gentleman presenting for initial evaluation of episodes of lightheadedness, feeling of presyncope questionably associated with change in position. Unclear etiology of the symptoms. Based and stereotypical nature, would need to rule out transient ischemic attack, though this would be an atypical presentation. With history of cervical degenerative changes would have concern that either carotid or vertebral artery may be affected. Patient is scheduled for a carotid Doppler. Will order brain MRI and transcranial Doppler. Continue  patient on aspirin 81 mg daily. Patient denies any severe neck pain at this time wishes to just monitor. There are no signs of cord involvement on exam. Would consider MRI cervical spine and/or EMG/NCS in the future. Follow up once workup completed.     Elspeth Cho, DO  [facility] [address] [place], [place] [address]  Phone [phone] Fax [phone]

## 2013-05-15 NOTE — Patient Instructions (Signed)
Overall you are doing fairly well but I do want to suggest a few things today:   As far as diagnostic testing:  1)Please have a copy of your carotid ultrasound sent to our office 2)We will order a transcranial doppler 3)We will order a brain MRI  I would like to see you back once the above is completed, sooner if we need to. Please call us with any interim questions, concerns, problems, updates or refill requests.   Please also call us for any test results so we can go over those with you on the phone.  My clinical assistant and will answer any of your questions and relay your messages to me and also relay most of my messages to you.   Our phone number is [phone]. We also have an after hours call service for urgent matters and there is a physician on-call for urgent questions. For any emergencies you know to call 911 or go to the nearest emergency room

## 2013-05-16 ENCOUNTER — Ambulatory Visit (HOSPITAL_COMMUNITY): Payer: BC Managed Care – PPO | Attending: Cardiology

## 2013-05-16 ENCOUNTER — Ambulatory Visit (INDEPENDENT_AMBULATORY_CARE_PROVIDER_SITE_OTHER): Payer: BC Managed Care – PPO | Admitting: Pharmacist

## 2013-05-16 VITALS — Wt 180.0 lb

## 2013-05-16 DIAGNOSIS — E785 Hyperlipidemia, unspecified: Secondary | ICD-10-CM

## 2013-05-16 DIAGNOSIS — Z79899 Other long term (current) drug therapy: Secondary | ICD-10-CM

## 2013-05-16 DIAGNOSIS — R209 Unspecified disturbances of skin sensation: Secondary | ICD-10-CM

## 2013-05-16 DIAGNOSIS — R42 Dizziness and giddiness: Secondary | ICD-10-CM | POA: Insufficient documentation

## 2013-05-16 MED ORDER — PSYLLIUM 58.6 % PO POWD: 1.0000 | Freq: Two times a day (BID) | ORAL | Status: DC

## 2013-05-16 MED ORDER — ATORVASTATIN CALCIUM 20 MG PO TABS: 20.0000 mg | ORAL_TABLET | Freq: Every day | ORAL | Status: DC

## 2013-05-16 NOTE — Patient Instructions (Signed)
1.  Reduce egg intake to no more than 2 eggs per week.  Egg white okay.  Change to cereal, oatmeal, yogurt/fruit, or fruit smoothie in the morning. 2.  Increase metamucil to twice daily between meals.   3.  Next month, try to start working out at the gym again. 4.  Start generic lipitor 20 mg once daily. 5.  Will check an NMR LipoProfile and liver panel in 3 months (see me 2 days later to review).

## 2013-05-16 NOTE — Progress Notes (Signed)
Jake [name], Thank you for seeing this guy! I had a long discussion with him as well. [name]

## 2013-05-16 NOTE — Assessment & Plan Note (Addendum)
Patient and I had a long discussion of benefits vs risk of statin use, and after discussing, patient feels much more comfortable about initiating a statin given his non-obstructive disease and elevated CAC at a young age.  It was reassuring to see Lp(a) < 5.  Patient may benefit from an NMR given non-obstructive disease at young age, and h/o elevated TG.  Patient's diet needs to be improved, and hopefully over the next few weeks he can get back to working out again at [employer].  We discussed dietary changes that were needed, and concentrated mostly on his breakfast.  I informed patient we would likely only consider Zetia or Welchol if he failed multiple statins, or if LDL was still elevated despite daily statin.  I explained that the PCSK-9 inhibitors were still in clinical trial, and were only appropriate if he was already on statin.  He has a prescription for lipitor 20 mg qd, but hasn't picked it up yet - he is wanting to start this now after our discussion. Plan: 1.  Reduce egg intake to no more than 2 eggs per week.  Egg white okay.  Change to cereal, oatmeal, yogurt/fruit, or fruit smoothie in the morning.  Patient is to try and eat a healthy snack if unable to eat a meal for lunch. 2.  Increase metamucil to twice daily between meals.  This will hopefully assist in weight loss, cholesterol reduction, and keeping him full throughout the day so he doesn't snack. 3.  Next month, try to start working out at the gym again ([employer]) 4.  Start generic lipitor 20 mg once daily. 5.  Will check an NMR LipoProfile and liver panel in 3 months (08/11/13), and see me 08/15/13.  LDL-P goal < 1000. 6.  Handouts given on low cholesterol diet and on advanced lipid testing for patient to review.

## 2013-05-16 NOTE — Progress Notes (Signed)
Patient referred to lipid clinic by Dr. Delton See given non-obstructive CAD and elevated CAC (39.82 - which is 80% for age and gender) on Coronary CT, and he wasn't ready to start statin therapy.  Patient hasn't taken a statin before, but was fearful due to issues he's heard about statins in the media.  Patient was given a prescription for Lipitor 20 mg qd by Dr. Delton See recently, but he never filled this.  His family history is non-contributory for heart disease.  Patient was interested in hearing about non-statin options (i.e. Zetia, Welchol, PCSK-9 inhibitors) for LDL lowering, and wanted to know if they would be appropriate in his situation.  He is not opposed to statin today, but wants to know his options.  His LDL is elevated and Lp(a) normal on lab work last week.  Patient also having a carotid ultrasound done today.    Risk factors:  Non-obstructive CAD, CAC > 75% for age/gender - LDL goal < 70 mg/dL preferably Medications:  None currently, with exception of metamucil once daily in the morning. Intolerant:  None  Social history:  Former smoker (quit 20 years ago), drinks wine - average 5-7 glasses per week. Family history:  Negative for CAD, however father had valvular heart disease.  Diet:  Breakfast - patient admits to eating 15 eggs per week.  He commonly eats sausage, bacon, and eggs most mornings.  Occasionally eats cereal.  He recently bought a juicer, and considering getting a VitaMix to make fruit/veggie smoothies in the morning instead.  Lunch - he often skips lunch due to work schedule, so will have a snack for lunch.  Dinner - is good, typically chicken or fish and vegetables.  He and his wife cook healthy dinners at home.  Rarely eats out, and never eats fast food.  Rarely drinks soda.  He understands that breakfast is his worst meal and needs to be corrected. Exercise:  Patient use to work out at the gym (weights and swimming), however stopped this a few months ago, and hasn't gone back  since having his intermittent chest pain which he is seeing Dr. Delton See for.  He is slowly getting back into this since joining the gym at [employer].  Labs: 05/2013 - TC 203, TG 136, HDL 44, LDL 135, LFTs normal.  Lp(a) < 5 (normal), Glucose 92, renal function normal  - Not on lipid lowering meds - Patient admits to having elevated TG in the past when his appetite was worse, and weight was closer to 190 lbs.  Current Outpatient Prescriptions  Medication Sig Dispense Refill  . psyllium (METAMUCIL) 58.6 % powder Take 1 packet by mouth daily.      Marland Kitchen acetaminophen (TYLENOL) 325 MG tablet Take 500 mg by mouth every 6 (six) hours as needed.       Marland Kitchen albuterol (VENTOLIN HFA) 108 (90 BASE) MCG/ACT inhaler Inhale 2 puffs into the lungs as needed.  18 g  2  . aspirin EC 81 MG tablet Take 1 tablet (81 mg total) by mouth daily.  90 tablet  3  . atorvastatin (LIPITOR) 20 MG tablet       . beclomethasone (BECONASE AQ) 42 MCG/SPRAY nasal spray Place 2 sprays into both nostrils 2 (two) times daily. Dose is for each nostril.  25 g  0  . fluticasone (FLOVENT HFA) 44 MCG/ACT inhaler Inhale 1 puff into the lungs 2 (two) times daily.  3 Inhaler  4  . HYDROcodone-acetaminophen (VICODIN) 5-500 MG per tablet Take 5-325 mg by  mouth as needed.       Marland Kitchen ibuprofen (ADVIL,MOTRIN) 400 MG tablet Take 400 mg by mouth every 6 (six) hours as needed.        . loratadine (CLARITIN) 10 MG tablet Take 10 mg by mouth daily.      . ranitidine (ZANTAC) 150 MG capsule Take 150 mg by mouth 2 (two) times daily.         No current facility-administered medications for this visit.   Allergies  Allergen Reactions  . Latex     REACTION: RASH

## 2013-05-19 ENCOUNTER — Encounter: Payer: Self-pay | Admitting: Cardiology

## 2013-05-19 ENCOUNTER — Ambulatory Visit (INDEPENDENT_AMBULATORY_CARE_PROVIDER_SITE_OTHER): Payer: BC Managed Care – PPO | Admitting: Cardiology

## 2013-05-19 VITALS — BP 126/78 | HR 67 | Ht 69.0 in | Wt 181.0 lb

## 2013-05-19 DIAGNOSIS — E785 Hyperlipidemia, unspecified: Secondary | ICD-10-CM

## 2013-05-19 DIAGNOSIS — I2510 Atherosclerotic heart disease of native coronary artery without angina pectoris: Secondary | ICD-10-CM

## 2013-05-19 NOTE — Patient Instructions (Signed)
Lab work  ( [facility] )  In 4 weeks   06/19/13 7:30 am to 5:00 pm    Your physician wants you to follow-up in: 6 months. You will receive a reminder letter in the mail two months in advance. If you don't receive a letter, please call our office to schedule the follow-up appointment.

## 2013-05-19 NOTE — Progress Notes (Signed)
Patient ID: ARYN KOPS, male   DOB: 1960/06/25, 52 y.o.   MRN: [id number]     Patient Name: Jake [name] Date of Encounter: 05/19/2013  Primary Care Provider:  Evette Georges, MD Primary Cardiologist:  Tobias Alexander, H   Problem List   Past Medical History  Diagnosis Date  . Allergy   . Asthma   . GERD (gastroesophageal reflux disease)    Past Surgical History  Procedure Laterality Date  . Lumbar laminectomy  1997  . Neck surgery  01/2010    c5-7  . Inguinal hernia repair  2009    Allergies  Allergies  Allergen Reactions  . Latex     REACTION: RASH    HPI  MAMORU TAKESHITA is a 52 y.o. male who in the last two weeks presented to the ER twice for episodes of chest pain tthat are retrosternal, sharp, lasting 5-10 seconds and are sometimes associated with exertion. There is no clear exacerbating or alleviating factor. It started when the patient had asthma flare up, but it has been treated and his pain continue to happen on daily basis.  The pain is associated with SOB, headaches and radiates to the neck and left arm. No relief with change of position.  He states that while driving to [place] 2 weeks ago he had a brief moment where he thought he was going to experience a syncopal episode. He is very anxious about it.  The patient denies association with food intake. He denies hypertension, hyperlipidemia, diabetes or family h/o CAD, he quit smoking 20 years ago.  Today he is coming after two weeks. He complains of necks tingling and pain as well as of intermittent pain at the bilateral temporal area. He is seeing a neurologist that is planning to perform brain MRI and transcranial Doppler.  Home Medications  Prior to Admission medications   Medication Sig Start Date End Date Taking? Authorizing Provider  acetaminophen (TYLENOL) 325 MG tablet Take 650 mg by mouth every 6 (six) hours as needed.      Historical Provider, MD  albuterol (VENTOLIN HFA) 108 (90  BASE) MCG/ACT inhaler Inhale 2 puffs into the lungs as needed. 04/12/13   Ethelda Chick, MD  beclomethasone (BECONASE AQ) 42 MCG/SPRAY nasal spray Place 2 sprays into both nostrils 2 (two) times daily. Dose is for each nostril. 04/13/13   Roderick Pee, MD  fluticasone (FLOVENT HFA) 44 MCG/ACT inhaler Inhale 1 puff into the lungs 2 (two) times daily. 04/12/13   Ethelda Chick, MD  HYDROcodone-acetaminophen (VICODIN) 5-500 MG per tablet Take 5-500 mg by mouth as needed. 01/28/12   Historical Provider, MD  ibuprofen (ADVIL,MOTRIN) 400 MG tablet Take 400 mg by mouth every 6 (six) hours as needed.      Historical Provider, MD  loratadine (CLARITIN) 10 MG tablet Take 10 mg by mouth daily.    Historical Provider, MD  ranitidine (ZANTAC) 150 MG capsule Take 150 mg by mouth 2 (two) times daily.      Historical Provider, MD    Family History  Family History  Problem Relation Age of Onset  . Alcohol abuse    . Asthma    . Depression    . Valvular heart disease      Social History  History   Social History  . Marital Status: Married    Spouse Name: Pollyann Glen    Number of Children: 4  . Years of Education: BA   Occupational History  .  Not on file.   Social History Main Topics  . Smoking status: Never Smoker   . Smokeless tobacco: Never Used  . Alcohol Use: Yes     Comment: 5-7 DRINKS WEEKLY  . Drug Use: No  . Sexual Activity: Not on file   Other Topics Concern  . Not on file   Social History Narrative   Patient is married Pollyann Glen) and has four children.   Patient lives at home with his wife and children.   Patient is working  At [employer].   Patient has a BA degree.   Patient used both hands.   Patient drinks three cups of coffee daily.     Review of Systems, as per HPI, otherwise negative General:  No chills, fever, night sweats or weight changes.  Cardiovascular:  No chest pain, dyspnea on exertion, edema, orthopnea, palpitations, paroxysmal nocturnal  dyspnea. Dermatological: No rash, lesions/masses Respiratory: No cough, dyspnea Urologic: No hematuria, dysuria Abdominal:   No nausea, vomiting, diarrhea, bright red blood per rectum, melena, or hematemesis Neurologic:  No visual changes, wkns, changes in mental status. All other systems reviewed and are otherwise negative except as noted above.  Physical Exam  BP 129/80, HR 93 BPM General: Pleasant, NAD Psych: Normal affect. Neuro: Alert and oriented X 3. Moves all extremities spontaneously. HEENT: Normal  Neck: Supple without bruits or JVD. Lungs:  Resp regular and unlabored, CTA. Heart: RRR no s3, s4, or murmurs. Abdomen: Soft, non-tender, non-distended, BS + x 4.  Extremities: No clubbing, cyanosis or edema. DP/PT/Radials 2+ and equal bilaterally.  Labs:  No results found for this basename: CKTOTAL, CKMB, TROPONINI,  in the last 72 hours Lab Results  Component Value Date   WBC 5.9 04/23/2013   HGB 15.4 04/23/2013   HCT 48.4 04/23/2013   MCV 95.8 04/23/2013   PLT 252.0 08/13/2011   No results found for this basename: NA, K, CL, CO2, BUN, CREATININE, CALCIUM, LABALBU, PROT, BILITOT, ALKPHOS, ALT, AST, GLUCOSE,  in the last 168 hours  Lipid Panel     Component Value Date/Time   CHOL 203* 05/08/2013 0754   TRIG 136.0 05/08/2013 0754   HDL 44.40 05/08/2013 0754   CHOLHDL 5 05/08/2013 0754   VLDL 27.2 05/08/2013 0754   Accessory Clinical Findings  Echocardiogram - none  ECG - sinus bradycardia, 53 beats per minutes, normal EKG.  Coronary CT 04/26/2013  Coronary arteries:  Left main is a large vessel with minimal non-calcified plague with 0-25 % stenosis. Left main gives rise to LAD, ramus intermedium and LCx artery.  LAD is a large vessel with a mid plague in its ostial portion with associated 25-50% stenosis. There are 2 other calcified plagues in the proximal LAD with associated 25-50% stenosis. LAD only has 0-25% stenosis in its mid and distal segment. First  diagonal vessel is rather small and only has mild luminal irregularities.  RI is moderate caliber vessel that further divides into two vessels and gives rise to the first septal branch. These two ramuses supply the entire anterolateral wall and have only mild luminal irregularities.  LCx is a small to moderate caliber non-dominant vessel that gives rise to one very small obtuse marginal branch. There is mild non-calcified plague in the proximal LCx artery associated with 0-25% stenosis.  RCA is a large dominant vessel that gives rise to PDA and PLVB. RCA has a mild calcified plague that is associated with 0-25% stenosis. The rest of RCA has only luminal irregularities. PDA  is a large vessel that runs all the way to the apex. There are only luminal irregularities. PLVB is a small vessel without significant stenosis.  Pericardium: Normal  IMPRESSION: 1. Coronary calcium score of 39.82. This was 18 percentile for age and sex matched control.  2. Non-obstructive CAD. Intense medical management is recommended.  Tobias Alexander    Assessment & Plan  52 year old male with   1. Non-obstructive CAD Chest pain associated with SOB and dizziness. They are somewhat atypical, but have some typical features as radiation to the neck and left arm. A stress test wasn't considered appropriate as he doesn't have exertional chest pain.  The patient underwent a coronary CT on 04/26/2013. His calcium score (40) place him into 80 percentile when matched for age and sex.  There was a non-obstructive plague in all three coronary arteries, most significant was the mixed plague in the proximal LAD associated with 25-50% stenosis. The patient was shown those images upon his request.  The patient falls into high risk category with proven CAD and should be placed on at least moderate dose of highly efficient statin. The patient is very educated and very hesitant.  He met with Alfonse Ras and was started  on atorvastatin 10 mg po QHS. He is scheduled for NMR lipid profile on 08/11/12.   We will check CMP in 4 weeks.   He was advised on appropriate type and frequency of exercise.  2. Neck and temporal pain, carotid US was normal B/L,  He is seeing a neurologist that is planning to perform brain MRI and transcranial Doppler.  3. Blood pressure - normal on no medication  4. Lipid profile - see above   Follow up in 6 months. CMP check in 4 weeks.    Lars Masson, MD, [facility] 05/19/2013, 5:24 PM

## 2013-05-30 ENCOUNTER — Ambulatory Visit (INDEPENDENT_AMBULATORY_CARE_PROVIDER_SITE_OTHER): Payer: BC Managed Care – PPO

## 2013-05-30 ENCOUNTER — Encounter (INDEPENDENT_AMBULATORY_CARE_PROVIDER_SITE_OTHER): Payer: Self-pay

## 2013-05-30 DIAGNOSIS — G459 Transient cerebral ischemic attack, unspecified: Secondary | ICD-10-CM

## 2013-05-30 DIAGNOSIS — R55 Syncope and collapse: Secondary | ICD-10-CM

## 2013-05-30 DIAGNOSIS — I669 Occlusion and stenosis of unspecified cerebral artery: Secondary | ICD-10-CM

## 2013-06-03 ENCOUNTER — Ambulatory Visit
Admission: RE | Admit: 2013-06-03 | Discharge: 2013-06-03 | Disposition: A | Discharge status: Home / Self Care | Payer: BC Managed Care – PPO | Source: Ambulatory Visit | Attending: Neurology | Admitting: Neurology

## 2013-06-03 DIAGNOSIS — G459 Transient cerebral ischemic attack, unspecified: Secondary | ICD-10-CM

## 2013-06-03 DIAGNOSIS — R55 Syncope and collapse: Secondary | ICD-10-CM

## 2013-06-07 ENCOUNTER — Ambulatory Visit (INDEPENDENT_AMBULATORY_CARE_PROVIDER_SITE_OTHER): Payer: BC Managed Care – PPO | Admitting: Family Medicine

## 2013-06-07 VITALS — BP 120/82 | HR 70 | Temp 98.0°F | Resp 16 | Ht 70.0 in | Wt 178.4 lb

## 2013-06-07 DIAGNOSIS — H9202 Otalgia, left ear: Secondary | ICD-10-CM

## 2013-06-07 DIAGNOSIS — R21 Rash and other nonspecific skin eruption: Secondary | ICD-10-CM

## 2013-06-07 DIAGNOSIS — H6980 Other specified disorders of Eustachian tube, unspecified ear: Secondary | ICD-10-CM

## 2013-06-07 DIAGNOSIS — H6982 Other specified disorders of Eustachian tube, left ear: Secondary | ICD-10-CM

## 2013-06-07 DIAGNOSIS — H9209 Otalgia, unspecified ear: Secondary | ICD-10-CM

## 2013-06-07 MED ORDER — PREDNISONE 20 MG PO TABS: ORAL_TABLET | ORAL | Status: DC

## 2013-06-07 NOTE — Patient Instructions (Signed)
1.  TAKE CLARITIN 10MG  DAILY. 2.  TAKE BENADRYL 25MG  ONE AT BEDTIME. 3.  INCREASE ZANTAC/RANITIDINE TO TWICE DAILY. 4.  TAKE PREDNISONE TAPER. 5.  STOP LIPITOR. 6.  CONSIDER RESTARTING LIPITOR IN ONE MONTH TO SEE IF RASH RECURS.

## 2013-06-07 NOTE — Progress Notes (Signed)
Subjective:    Patient ID: Jake [name], male    DOB: 21-May-1961, 53 y.o.   MRN: [id number]  Chief Complaint  Patient presents with  . Rash    x 1 month itches  . Otalgia    L Ear  x 3 days off & on   HPI  This chart was scribed for Rhylie Stehr M. Tamala Julian, MD, by Sydell Axon, ED Scribe. This patient was seen in room 09 and the patient's care was started at 8:27 PM.  HPI Comments: Jake [name] is a 53 y.o. Male with a history of Asthma and GERD, who presents to the [facility] complaining of a progressively worsening, diffuse, erythematous, urticarial rash to his lower extremities bilaterally. He noticed his rash begin one month ago after wearing wool pants, which he believes may have caused a skin reation. One week ago, he stopped wearing dress pants with no relief of symptoms. Patient has noted the rash spreading to his thighs, scrotum, and most recently, to his arms bilaterally. He has also noticed some hair loss with coarse hair growing in. Patient recently began taking Lipitor and is concerned the worsening of the rash is related to this; however, he has confirmed the rash was present prior to taking Lipitor. Patient also states he recently began using dress clothes, but does not think this is due to his allergy. Patient has a history of skin allergies to latex. Patient has used Eucerin ointment with minimal relief.  Patient also presents with intermittent L ear pain began three days ago and usually lasts a short period of time. He states he has had HA to the left side as well and reports an itchy sensation. Patient reports some mild pain with movement of the ear.  He states he does not sleep on his side.   Past Medical History  Diagnosis Date  . Allergy   . Asthma   . GERD (gastroesophageal reflux disease)     Past Surgical History  Procedure Laterality Date  . Lumbar laminectomy  1997  . Neck surgery  01/2010    c5-7  . Inguinal hernia repair  2009     Family History  Problem Relation Age of Onset  . Alcohol abuse    . Asthma    . Depression    . Valvular heart disease    . Osteoporosis Mother   . Parkinson's disease Father     History   Social History  . Marital Status: Married    Spouse Name: [name]    Number of Children: 4  . Years of Education: BA   Occupational History  . Not on file.   Social History Main Topics  . Smoking status: Former Smoker -- 0.50 packs/day for 20 years    Types: Cigarettes    Quit date: 08/19/1991  . Smokeless tobacco: Never Used  . Alcohol Use: Yes     Comment: 5-7 DRINKS WEEKLY  . Drug Use: No  . Sexual Activity: Not on file   Other Topics Concern  . Not on file   Social History Narrative   Patient is married [name]) and has four children.   Patient lives at home with his wife and children.   Patient is working  At [employer].   Patient has a BA degree.   Patient used both hands.   Patient drinks three cups of coffee daily.   Allergies  Allergen Reactions  . Latex  REACTION: RASH   Patient Active Problem List   Diagnosis Date Noted  . Other and unspecified hyperlipidemia 05/16/2013  . External hemorrhoid 05/04/2013  . Chest pain at rest 05/03/2013  . Neck pain 05/03/2013  . Dizziness 05/03/2013  . Atherosclerotic coronary vascular disease 05/03/2013  . Chest pain 04/24/2013  . Prostatitis 04/05/2012  . ASTHMA 10/03/2007  . GERD 10/03/2007  . VENTRAL HERNIA, SMALL 10/03/2007  . Allergic Rhinitis, Cause Unspecified 03/01/2007  . ESOPHAGEAL STRICTURE 03/01/2007  . POLYP, COLON 10/27/2005  . SCHATZKI'S RING, HX OF 10/27/2005   Current Outpatient Prescriptions on File Prior to Visit  Medication Sig Dispense Refill  . acetaminophen (TYLENOL) 325 MG tablet Take 500 mg by mouth every 6 (six) hours as needed.       Marland Kitchen albuterol (VENTOLIN HFA) 108 (90 BASE) MCG/ACT inhaler Inhale 2 puffs into the lungs as needed.  18 g  2  . aspirin EC 81 MG tablet Take  1 tablet (81 mg total) by mouth daily.  90 tablet  3  . atorvastatin (LIPITOR) 20 MG tablet Take 1 tablet (20 mg total) by mouth daily at 6 PM.      . beclomethasone (BECONASE AQ) 42 MCG/SPRAY nasal spray Place 2 sprays into both nostrils 2 (two) times daily. Dose is for each nostril.  25 g  0  . fluticasone (FLOVENT HFA) 44 MCG/ACT inhaler Inhale 1 puff into the lungs 2 (two) times daily.  3 Inhaler  4  . HYDROcodone-acetaminophen (VICODIN) 5-500 MG per tablet Take 5-325 mg by mouth as needed.       Marland Kitchen ibuprofen (ADVIL,MOTRIN) 400 MG tablet Take 400 mg by mouth every 6 (six) hours as needed.        . loratadine (CLARITIN) 10 MG tablet Take 10 mg by mouth daily.      . psyllium (METAMUCIL) 58.6 % powder Take 1 packet by mouth 2 (two) times daily.  283 g    . ranitidine (ZANTAC) 150 MG capsule Take 150 mg by mouth 2 (two) times daily.         No current facility-administered medications on file prior to visit.    BP 120/82  Pulse 70  Temp(Src) 98 F (36.7 C) (Oral)  Resp 16  Ht 5\' 10"  (1.778 m)  Wt 178 lb 6.4 oz (80.922 kg)  BMI 25.60 kg/m2  SpO2 99%   Review of Systems  Constitutional: Negative for chills, activity change and appetite change.  HENT: Positive for ear pain. Negative for congestion, ear discharge, facial swelling, postnasal drip, rhinorrhea, sinus pressure and sore throat.   Respiratory: Negative for cough and shortness of breath.   Gastrointestinal: Negative for nausea.  Skin: Positive for color change and rash. Negative for pallor and wound.  Neurological: Positive for headaches. Negative for weakness and light-headedness.  All other systems reviewed and are negative.       Objective:   Physical Exam  Nursing note and vitals reviewed. Constitutional: He is oriented to person, place, and time. He appears well-developed and well-nourished. No distress.  HENT:  Head: Normocephalic and atraumatic.  Right Ear: Tympanic membrane and external ear normal.  Left Ear:  Tympanic membrane and external ear normal.  Eyes: EOM are normal.  Neck: Normal range of motion. Neck supple.  Cardiovascular: Normal rate, regular rhythm and normal heart sounds.   Pulmonary/Chest: Effort normal and breath sounds normal. No respiratory distress.  Musculoskeletal: Normal range of motion.  Neurological: He is alert and oriented to person, place,  and time.  Skin: Rash noted. Rash is urticarial. There is erythema.  Diffuse, urticarial, erythematous rash to the anterior/posterior lower extremities and into the thighs medially and into the scrotum.  Psychiatric: He has a normal mood and affect. His behavior is normal.    Assessment & Plan:  Rash and nonspecific skin eruption  Otalgia, left  Eustachian tube dysfunction, left   1. Urticarial rash: New.  Rx for Prednisone provided. Also recommend daily antihistamine such as Zyrtec or Claritin; also recommend Ranitidine 150mg  bid for two weeks; also recommend Benadryl 25mg  qhs for two weeks. RTC for acute worsening, facial swelling, SOB.  Etiology unclear; did recently start Lipitor but rash mostly isolated to lower extremities to suggest a contact dermatitis/reaction. 2.  L otalgia:  New. Recommend Tylenol PRN.  Benign exam. 3.  L eustachian tube dysfunction:  New. Rx for prednisone provided; also start Zyrtec or Claritin daily.   Meds ordered this encounter  Medications  . DISCONTD: predniSONE (DELTASONE) 20 MG tablet    Sig: Three tablets daily x 1 day then two tablets daily x 5 days then one tablet daily x 5 days    Dispense:  18 tablet    Refill:  0    I personally performed the services described in this documentation, which was scribed in my presence.  The recorded information has been reviewed and is accurate.   Reginia Forts, M.D.  [facility] [address] [place], [place]  [address] [phone] phone [phone] fax

## 2013-06-15 ENCOUNTER — Ambulatory Visit (INDEPENDENT_AMBULATORY_CARE_PROVIDER_SITE_OTHER): Payer: BC Managed Care – PPO | Admitting: Neurology

## 2013-06-15 ENCOUNTER — Encounter: Payer: Self-pay | Admitting: Neurology

## 2013-06-15 VITALS — BP 129/84 | HR 75 | Ht 70.0 in | Wt 180.0 lb

## 2013-06-15 DIAGNOSIS — F09 Unspecified mental disorder due to known physiological condition: Secondary | ICD-10-CM

## 2013-06-15 DIAGNOSIS — R209 Unspecified disturbances of skin sensation: Secondary | ICD-10-CM

## 2013-06-15 DIAGNOSIS — R202 Paresthesia of skin: Secondary | ICD-10-CM

## 2013-06-15 DIAGNOSIS — R4189 Other symptoms and signs involving cognitive functions and awareness: Secondary | ICD-10-CM

## 2013-06-15 NOTE — Patient Instructions (Addendum)
Overall you are doing fairly well but I do want to suggest a few things today:   Remember to drink plenty of fluid, eat healthy meals and do not skip any meals. Try to eat protein with a every meal and eat a healthy snack such as fruit or nuts in between meals. Try to keep a regular sleep-wake schedule and try to exercise daily, particularly in the form of walking, 20-30 minutes a day, if you can.   We will check blood work today. Please have this done after checking out.  I ordered formal cognitive testing with neuro-psychology  As far as diagnostic testing:   I would like to see you back in 3 months, sooner if we need to. Please call us with any interim questions, concerns, problems, updates or refill requests.   Please also call us for any test results so we can go over those with you on the phone.  My clinical assistant and will answer any of your questions and relay your messages to me and also relay most of my messages to you.   Our phone number is [phone]. We also have an after hours call service for urgent matters and there is a physician on-call for urgent questions. For any emergencies you know to call 911 or go to the nearest emergency room

## 2013-06-15 NOTE — Progress Notes (Signed)
[facility]    Provider:  Dr Janann Colonel Referring Provider: Dorena Cookey, MD Primary Care Physician:  Joycelyn Man, MD  CC: "I feel like I am going to pass out"  HPI:  Jake [name] is a 53 y.o. male here as a follow up from Dr. Sherren Mocha for neck pain and concern of possible pre-syncopal episodes. At last visit he had a brain MRI ordered which was normal and carotid ultrasound which was also unremarkable. Since last visit he developed a diffuse LE rash, given steroid taper by PCP with symptomatic benefit.   Continues to have episodes of light headed sensation, gets tingling sensation on top of forehead. Continues to have a chronic "nagging" itching headache, predominantly in the occipital and temporal region. Occuring 2-3 times a week. Can last up to an hour. Can get intense at times. Is typically on the left side. No nausea/emesis. No focal weakness or sensory changes.    He also notes occasional episodes where he will start writing, and will skip a letter, starts writing with the 2nd letter. Also has some trouble with word finding, word substitution.    Initial visit 05/2013 For the past few months he notes sensations of light headed, feeling like he will pass out, feeling very fatigued. No vertigo episodes related with this.Scheduled to have a carotid doppler tomorrow. This is questionably related to turning head to the left but unsure if overall triggered by a certain position but can also be related to going from sitting to standing. Lasts just a few seconds, doesn't need to sit down to feel better. Has sensation of head pain/itching sensation in the L occpital and temporal region but denies any other current headache related symptoms. No automatisms during these events. No visual changes graying out or blacking out of vision. Denies any palpitations associated with these events. These events appear to be stereotyped.   Has been having intermittent chest pain, told  25% blockage in LAD. Told to get carotid doppler, consider statin but he is seeing someone for a statin alternative, scheduled to see pharmacist [facility]. No SOB, no diaphoresis associated with the chest pain. Lasts a few seconds.   History of cervical surgery in 2011, had anterior disc fusion. Since then has had intermittent paresthesias in his fingers but this appears to be a different type pain.Denies any other concerns with neck pain, main concern at this time is the above issue. No prior strokes, no TIAs.   Review of Systems: Out of a complete 14 system review, the patient complains of only the following symptoms, and all other reviewed systems are negative. Positive for dizziness headache testicular pain chest tightness ringing in ears  History   Social History  . Marital Status: Married    Spouse Name: [name]    Number of Children: 4  . Years of Education: BA   Occupational History  . Not on file.   Social History Main Topics  . Smoking status: Former Smoker -- 0.50 packs/day for 20 years    Types: Cigarettes    Quit date: 08/19/1991  . Smokeless tobacco: Never Used  . Alcohol Use: Yes     Comment: 5-7 DRINKS WEEKLY  . Drug Use: No  . Sexual Activity: Not on file   Other Topics Concern  . Not on file   Social History Narrative   Patient is married [name]) and has four children.   Patient lives at home with his wife and children.  Patient is working  At [employer].   Patient has a BA degree.   Patient used both hands.   Patient drinks three cups of coffee daily.    Family History  Problem Relation Age of Onset  . Alcohol abuse    . Asthma    . Depression    . Valvular heart disease    . Osteoporosis Mother   . Parkinson's disease Father     Past Medical History  Diagnosis Date  . Allergy   . Asthma   . GERD (gastroesophageal reflux disease)     Past Surgical History  Procedure Laterality Date  . Lumbar laminectomy  1997  . Neck  surgery  01/2010    c5-7  . Inguinal hernia repair  2009    Current Outpatient Prescriptions  Medication Sig Dispense Refill  . acetaminophen (TYLENOL) 325 MG tablet Take 500 mg by mouth every 6 (six) hours as needed.       Marland Kitchen albuterol (VENTOLIN HFA) 108 (90 BASE) MCG/ACT inhaler Inhale 2 puffs into the lungs as needed.  18 g  2  . aspirin EC 81 MG tablet Take 1 tablet (81 mg total) by mouth daily.  90 tablet  3  . beclomethasone (BECONASE AQ) 42 MCG/SPRAY nasal spray Place 2 sprays into both nostrils 2 (two) times daily. Dose is for each nostril.  25 g  0  . fluticasone (FLOVENT HFA) 44 MCG/ACT inhaler Inhale 1 puff into the lungs 2 (two) times daily.  3 Inhaler  4  . HYDROcodone-acetaminophen (VICODIN) 5-500 MG per tablet Take 5-325 mg by mouth as needed.       Marland Kitchen ibuprofen (ADVIL,MOTRIN) 400 MG tablet Take 400 mg by mouth every 6 (six) hours as needed.        . loratadine (CLARITIN) 10 MG tablet Take 10 mg by mouth daily.      . predniSONE (DELTASONE) 20 MG tablet Three tablets daily x 1 day then two tablets daily x 5 days then one tablet daily x 5 days  18 tablet  0  . psyllium (METAMUCIL) 58.6 % powder Take 1 packet by mouth 2 (two) times daily.  283 g    . ranitidine (ZANTAC) 150 MG capsule Take 150 mg by mouth 2 (two) times daily.        Marland Kitchen atorvastatin (LIPITOR) 20 MG tablet Take 1 tablet (20 mg total) by mouth daily at 6 PM.       No current facility-administered medications for this visit.    Allergies as of 06/15/2013 - Review Complete 06/15/2013  Allergen Reaction Noted  . Latex  03/01/2007    Vitals: BP 129/84  Pulse 75  Ht 5\' 10"  (1.778 m)  Wt 180 lb (81.647 kg)  BMI 25.83 kg/m2 Last Weight:  Wt Readings from Last 1 Encounters:  06/15/13 180 lb (81.647 kg)   Last Height:   Ht Readings from Last 1 Encounters:  06/15/13 5\' 10"  (1.778 m)     Physical exam: Exam: Gen: NAD, conversant Eyes: anicteric sclerae, moist conjunctivae HENT: Atraumatic, oropharynx  clear Neck: Trachea midline; supple,  Lungs: CTA, no wheezing, rales, rhonic                          CV: RRR, no MRG, no carotid bruits Abdomen: Soft, non-tender;  Extremities: No peripheral edema  Skin: Normal temperature, no rash,  Psych: Appropriate affect, pleasant  Neuro: MS: AA&Ox3, appropriately interactive, normal affect  Speech: fluent w/o paraphasic error  Memory: good recent and remote recall  CN: PERRL, EOMI no nystagmus, no ptosis, sensation intact to LT V1-V3 bilat, face symmetric, no weakness, hearing grossly intact, palate elevates symmetrically, shoulder shrug 5/5 bilat,  tongue protrudes midline, no fasiculations noted.  Motor: normal bulk and tone Strength: 5/5  In all extremities  Coord: rapid alternating and point-to-point (FNF, HTS) movements intact.  Reflexes: symmetrical, bilat downgoing toes  Sens: LT intact in all extremities  Gait: posture, stance, stride and arm-swing normal. Tandem gait intact. Able to walk on heels and toes. Romberg absent.   Assessment:  After physical and neurologic examination, review of laboratory studies, imaging, neurophysiology testing and pre-existing records, assessment will be reviewed on the problem list.  Plan:  Treatment plan and additional workup will be reviewed under Problem List.  1)Dizziness 2)word finding difficulties/cognitive decline 3)Headache 4)Rash  Mr. Minus is a pleasant 53 year old gentleman presenting for follow up evaluation of episodes of lightheadedness and headache with new concern of rash and cognitive decline. With variety of symptoms would consider rheum/autoimmune etiology. Will check basic lab workup. Will refer to neuro-psychology for formal cognitive testing. Question if anxiety/stress playing role in patients symptoms. Follow up in 3 to 4 months.     Jim Like, DO  [facility] [address] [place], [place] [address]  Phone [phone] Fax  [phone]

## 2013-06-16 LAB — ANA: Anti Nuclear Antibody(ANA): NEGATIVE

## 2013-06-16 LAB — SJOGREN'S SYNDROME ANTIBODS(SSA + SSB)
ENA SSA (RO) Ab: <0.2 AI (ref 0.0–0.9)
ENA SSB (LA) Ab: <0.2 AI (ref 0.0–0.9)

## 2013-06-16 LAB — TSH: TSH: 1.17 u[IU]/mL (ref 0.450–4.500)

## 2013-06-16 LAB — VITAMIN B12: Vitamin B-12: 813 pg/mL (ref 211–946)

## 2013-06-19 ENCOUNTER — Other Ambulatory Visit (INDEPENDENT_AMBULATORY_CARE_PROVIDER_SITE_OTHER): Payer: BC Managed Care – PPO

## 2013-06-19 DIAGNOSIS — E785 Hyperlipidemia, unspecified: Secondary | ICD-10-CM

## 2013-06-19 DIAGNOSIS — I2510 Atherosclerotic heart disease of native coronary artery without angina pectoris: Secondary | ICD-10-CM

## 2013-06-19 LAB — COMPREHENSIVE METABOLIC PANEL
ALT: 20 U/L (ref 0–53)
AST: 21 U/L (ref 0–37)
Albumin: 3.9 g/dL (ref 3.5–5.2)
Alkaline Phosphatase: 57 U/L (ref 39–117)
BUN: 13 mg/dL (ref 6–23)
CO2: 28 mEq/L (ref 19–32)
Calcium: 8.8 mg/dL (ref 8.4–10.5)
Chloride: 102 mEq/L (ref 96–112)
Creatinine, Ser: 0.9 mg/dL (ref 0.4–1.5)
GFR: 94.96 mL/min (ref >60.00)
Glucose, Bld: 88 mg/dL (ref 70–99)
Potassium: 3.9 mEq/L (ref 3.5–5.1)
Sodium: 137 mEq/L (ref 135–145)
Total Bilirubin: 0.9 mg/dL (ref 0.3–1.2)
Total Protein: 6.6 g/dL (ref 6.0–8.3)

## 2013-06-23 ENCOUNTER — Ambulatory Visit (INDEPENDENT_AMBULATORY_CARE_PROVIDER_SITE_OTHER): Payer: BC Managed Care – PPO | Admitting: *Deleted

## 2013-06-23 DIAGNOSIS — E785 Hyperlipidemia, unspecified: Secondary | ICD-10-CM

## 2013-06-23 DIAGNOSIS — Z79899 Other long term (current) drug therapy: Secondary | ICD-10-CM

## 2013-06-23 LAB — HEPATIC FUNCTION PANEL
ALT: 18 U/L (ref 0–53)
AST: 17 U/L (ref 0–37)
Albumin: 4.3 g/dL (ref 3.5–5.2)
Alkaline Phosphatase: 56 U/L (ref 39–117)
Bilirubin, Direct: 0 mg/dL (ref 0.0–0.3)
Total Bilirubin: 0.7 mg/dL (ref 0.3–1.2)
Total Protein: 6.9 g/dL (ref 6.0–8.3)

## 2013-06-26 LAB — NMR LIPOPROFILE WITH LIPIDS
Cholesterol, Total: 210 mg/dL — ABNORMAL HIGH (ref <200)
HDL Particle Number: 36.8 umol/L (ref >=30.5)
HDL Size: 8.8 nm — ABNORMAL LOW (ref >=9.2)
HDL-C: 56 mg/dL (ref >=40)
LDL (calc): 119 mg/dL — ABNORMAL HIGH (ref <100)
LDL Particle Number: 1454 nmol/L — ABNORMAL HIGH (ref <1000)
LDL Size: 21.3 nm (ref >20.5)
LP-IR Score: 43 (ref <=45)
Large HDL-P: 5.1 umol/L (ref >=4.8)
Large VLDL-P: 2.2 nmol/L (ref <=2.7)
Small LDL Particle Number: 475 nmol/L (ref <=527)
Triglycerides: 174 mg/dL — ABNORMAL HIGH (ref <150)
VLDL Size: 44.1 nm (ref <=46.6)

## 2013-06-27 ENCOUNTER — Telehealth: Payer: Self-pay | Admitting: Pharmacist

## 2013-06-27 DIAGNOSIS — Z79899 Other long term (current) drug therapy: Secondary | ICD-10-CM

## 2013-06-27 DIAGNOSIS — E785 Hyperlipidemia, unspecified: Secondary | ICD-10-CM

## 2013-06-27 NOTE — Telephone Encounter (Signed)
Patient started atorvastatin 05/16/13 given elevated LDL in setting of non-obstructive CAD and elevated CAC.  On 06/07/13 he went to urgent care with a rash on his legs.  He was given prednisone and told to hold the atorvastatin.  They physician didn't think it was atorvastatin, but held the agent to be sure.  He was told to restart atorvastatin a few weeks later.  Patient had an NMR done a few days ago showing LDL-P number of 1450 (off statin).  LDL was 119, TC 210.  Patient wants to know what he should do now.  Patient notified to restart atorvastatin daily and to see me in in 07/2013 as planned.  He will get NMR / LFTs a few days prior.  If he develops rash on lipitor again, we will change to a different statin.  The incidence of rash on lipitor is very rare.  To Dr. Meda Coffee as Juluis Rainier only.

## 2013-08-11 ENCOUNTER — Other Ambulatory Visit: Payer: BC Managed Care – PPO

## 2013-08-11 ENCOUNTER — Other Ambulatory Visit (INDEPENDENT_AMBULATORY_CARE_PROVIDER_SITE_OTHER): Payer: BC Managed Care – PPO

## 2013-08-11 DIAGNOSIS — E785 Hyperlipidemia, unspecified: Secondary | ICD-10-CM

## 2013-08-11 DIAGNOSIS — Z79899 Other long term (current) drug therapy: Secondary | ICD-10-CM

## 2013-08-11 LAB — HEPATIC FUNCTION PANEL
ALT: 26 U/L (ref 0–53)
AST: 26 U/L (ref 0–37)
Albumin: 4.6 g/dL (ref 3.5–5.2)
Alkaline Phosphatase: 65 U/L (ref 39–117)
Bilirubin, Direct: 0 mg/dL (ref 0.0–0.3)
Total Bilirubin: 0.5 mg/dL (ref 0.3–1.2)
Total Protein: 7.2 g/dL (ref 6.0–8.3)

## 2013-08-14 LAB — NMR LIPOPROFILE WITH LIPIDS
Cholesterol, Total: 191 mg/dL (ref <200)
HDL Particle Number: 37 umol/L (ref >=30.5)
HDL Size: 8.9 nm — ABNORMAL LOW (ref >=9.2)
HDL-C: 39 mg/dL — ABNORMAL LOW (ref >=40)
LDL Particle Number: 1498 nmol/L — ABNORMAL HIGH (ref <1000)
LDL Size: 19.7 nm — ABNORMAL LOW (ref >20.5)
LP-IR Score: 91 — ABNORMAL HIGH (ref <=45)
Large HDL-P: 6 umol/L (ref >=4.8)
Large VLDL-P: 32.9 nmol/L — ABNORMAL HIGH (ref <=2.7)
Small LDL Particle Number: 1095 nmol/L — ABNORMAL HIGH (ref <=527)
Triglycerides: 436 mg/dL — ABNORMAL HIGH (ref <150)
VLDL Size: 68.5 nm — ABNORMAL HIGH (ref <=46.6)

## 2013-08-15 ENCOUNTER — Ambulatory Visit (INDEPENDENT_AMBULATORY_CARE_PROVIDER_SITE_OTHER): Payer: BC Managed Care – PPO | Admitting: Pharmacist

## 2013-08-15 DIAGNOSIS — Z79899 Other long term (current) drug therapy: Secondary | ICD-10-CM

## 2013-08-15 DIAGNOSIS — E785 Hyperlipidemia, unspecified: Secondary | ICD-10-CM

## 2013-08-15 MED ORDER — SIMVASTATIN 40 MG PO TABS: 40.0000 mg | ORAL_TABLET | Freq: Every day | ORAL | Status: DC

## 2013-08-15 NOTE — Progress Notes (Signed)
Patient referred to lipid clinic by Dr. Meda Coffee given non-obstructive CAD and elevated CAC (39.82 - which is 80% for age and gender) on Coronary CT and needed to be on statin.  Lp(a) was normal.  He was started on lipitor 20 mg qd a few months ago, and but developed a rash 06/2013 and stopped lipitor for a period of time to see if there was any improvement.  Patient states that he was having a little muscle soreness in his legs and back prior to stopping lipitor in 06/2013 also.  Patient restarted lipitor 1 week prior to his most recent lab work and he is certain the lipitor is not what caused his rash, but he tells me the muscle soreness symptoms have returned since he restarted lipitor.  His LDL-P remains at 1400 nmol/L which is similar to what it looked like prior to starting statin.  His TG oddly enough increased to 400 mg/dL when this was historically < 150 mg/dL.  Glucose normal.  Denies alcohol or steroid use.  He was sick a week prior to labs and took some antibiotics for this.    Risk factors:  Non-obstructive CAD, CAC > 75% for age/gender - LDL goal < 70 mg/dL preferably Medications:  Lipitor 20 mg x 1 week, Metamucil once daily in the morning. Intolerant:  None - but did get some muscle weakness on lipitor 20 gm qd, which did improve off it.  Social history:  Former smoker (quit 20 years ago), drinks wine - average 5-7 glasses per week. Family history:  Negative for CAD, however father had valvular heart disease.  Diet: .   He recently bought a juicer, and considering getting a VitaMix to make fruit/veggie smoothies in the morning instead.  Lunch - he often skips lunch due to work schedule, so will have a snack for lunch.  Dinner - is good, typically chicken or fish and vegetables.  He and his wife cook healthy dinners at home.  Rarely eats out, never eats fast food.  Doesn't drink soda. Exercise:   Working out at [employer] a few days of the week.  Labs: 07/2013 - TC 191, TG 436 (strange one  time elevation), LDL- n/a, LDL-P 1498, HDL 39, LFTs normal (lipitor 20 mg x 1 week) 05/2013 - TC 203, TG 136, HDL 44, LDL 135, LFTs normal.  Lp(a) < 5 (normal), Glucose 92, renal function normal  - Not on lipid lowering meds - Patient admits to having elevated TG in the past when his appetite was worse, and weight was closer to 190 lbs.  Current Outpatient Prescriptions  Medication Sig Dispense Refill  . acetaminophen (TYLENOL) 325 MG tablet Take 500 mg by mouth every 6 (six) hours as needed.       Marland Kitchen albuterol (VENTOLIN HFA) 108 (90 BASE) MCG/ACT inhaler Inhale 2 puffs into the lungs as needed.  18 g  2  . aspirin EC 81 MG tablet Take 1 tablet (81 mg total) by mouth daily.  90 tablet  3  . beclomethasone (BECONASE AQ) 42 MCG/SPRAY nasal spray Place 2 sprays into both nostrils 2 (two) times daily. Dose is for each nostril.  25 g  0  . fluticasone (FLOVENT HFA) 44 MCG/ACT inhaler Inhale 1 puff into the lungs 2 (two) times daily.  3 Inhaler  4  . HYDROcodone-acetaminophen (VICODIN) 5-500 MG per tablet Take 5-325 mg by mouth as needed.       Marland Kitchen ibuprofen (ADVIL,MOTRIN) 400 MG tablet Take 400 mg by  mouth every 6 (six) hours as needed.        . loratadine (CLARITIN) 10 MG tablet Take 10 mg by mouth daily.      . psyllium (METAMUCIL) 58.6 % powder Take 1 packet by mouth daily.      . ranitidine (ZANTAC) 150 MG capsule Take 150 mg by mouth daily.       . simvastatin (ZOCOR) 40 MG tablet Take 1 tablet (40 mg total) by mouth daily.  30 tablet  6   No current facility-administered medications for this visit.   Allergies  Allergen Reactions  . Other Rash    Wool pants caused rash   . Latex     REACTION: RASH

## 2013-08-15 NOTE — Assessment & Plan Note (Signed)
Given patient's LDL is elevated and having some muscle soreness on lipitor 20 mg qd already, will change to a different statin, as increasing lipitor dose will likely worsen muscle soreness.  LDL would likely be lower if on statin for a longer period of time prior to getting blood work done.  Also the TG level of > 400 mg/dL was strange for this patient as this is normally < 150 mg/dL.  Will keep an eye on this.  Will change to simvastatin instead, and check an NMR and CMET in 3 months.  Will assess glucose to see if this is trending up and possibly affecting TG.  Discussed the option of Co-Enzyme Q-10 with patient as well in case muscle aches return on simvastatin. Plan: 1.  D/C atorvastatin due to muscle aches. 2.  Start simvastatin 40 mg - 1/2 tablet daily for 4 weeks, then increase to 1 tablet daily. 3.  If muscle aches occur, add Co-Q 10 200 mg daily. 4.  If LDL remains elevated in 3 months, may consider changing simvastatin to vytorin. 5.  Recheck NMR / CMET in 3 months, and me the next day.

## 2013-08-15 NOTE — Patient Instructions (Addendum)
Thanks for visiting [place] today, it was good to see you.  We will change your Atorvastatin 20mg  to Simvastatin 40mg  once daily-  Take 1/2 tablet for the first 4 weeks, then increase to 1 tablet daily.  Take in the evening. If your muscle aches persist, you may want to consider Coenzyme Q-10, 200mg  once daily (over the counter)  We will recheck your labs and see you in 3 months. Labwork in 3 months (11/20/13 fasting), and see Ysidro Evert the next day at 11/21/13 at 9:30 am

## 2013-08-22 DIAGNOSIS — R41841 Cognitive communication deficit: Secondary | ICD-10-CM

## 2013-08-30 ENCOUNTER — Ambulatory Visit (INDEPENDENT_AMBULATORY_CARE_PROVIDER_SITE_OTHER): Payer: BC Managed Care – PPO | Admitting: Neurology

## 2013-08-30 ENCOUNTER — Encounter: Payer: Self-pay | Admitting: Neurology

## 2013-08-30 VITALS — BP 119/71 | HR 72 | Ht 69.0 in | Wt 186.0 lb

## 2013-08-30 DIAGNOSIS — R209 Unspecified disturbances of skin sensation: Secondary | ICD-10-CM

## 2013-08-30 DIAGNOSIS — F09 Unspecified mental disorder due to known physiological condition: Secondary | ICD-10-CM

## 2013-08-30 DIAGNOSIS — R4189 Other symptoms and signs involving cognitive functions and awareness: Secondary | ICD-10-CM

## 2013-08-30 DIAGNOSIS — R202 Paresthesia of skin: Secondary | ICD-10-CM

## 2013-08-30 DIAGNOSIS — R55 Syncope and collapse: Secondary | ICD-10-CM

## 2013-08-30 NOTE — Progress Notes (Signed)
[facility]    Provider:  Dr Janann Colonel Referring Provider: Dorena Cookey, MD Primary Care Physician:  Joycelyn Man, MD  CC: "I feel like I am going to pass out"  HPI:  Jake [name] is a 53 y.o. male here as a follow up from Dr. Sherren Mocha for neck pain, concern of possible syncopal episodes and cognitive decline. At last visit he had an autoimmune workup done and was referred to neuro-psych for cognitive testing. The autoimmune workup was unremarkable. The cognitive testing was overall normal. Since last visit he has been doing well. Continues to have intermittent episodes of paresthesias in the left occipital region.    Initial visit 05/2013 For the past few months he notes sensations of light headed, feeling like he will pass out, feeling very fatigued. No vertigo episodes related with this.Scheduled to have a carotid doppler tomorrow. This is questionably related to turning head to the left but unsure if overall triggered by a certain position but can also be related to going from sitting to standing. Lasts just a few seconds, doesn't need to sit down to feel better. Has sensation of head pain/itching sensation in the L occpital and temporal region but denies any other current headache related symptoms. No automatisms during these events. No visual changes graying out or blacking out of vision. Denies any palpitations associated with these events. These events appear to be stereotyped.   Has been having intermittent chest pain, told 25% blockage in LAD. Told to get carotid doppler, consider statin but he is seeing someone for a statin alternative, scheduled to see pharmacist [facility]. No SOB, no diaphoresis associated with the chest pain. Lasts a few seconds.   History of cervical surgery in 2011, had anterior disc fusion. Since then has had intermittent paresthesias in his fingers but this appears to be a different type pain.Denies any other concerns with neck  pain, main concern at this time is the above issue. No prior strokes, no TIAs.   Review of Systems: Out of a complete 14 system review, the patient complains of only the following symptoms, and all other reviewed systems are negative. Positive for dizziness headache pain chest tightness ringing in ears  History   Social History  . Marital Status: Married    Spouse Name: Jake [name]    Number of Children: 4  . Years of Education: BA   Occupational History  . Not on file.   Social History Main Topics  . Smoking status: Former Smoker -- 0.50 packs/day for 20 years    Types: Cigarettes    Quit date: 08/19/1991  . Smokeless tobacco: Never Used  . Alcohol Use: Yes     Comment: 5-7 DRINKS WEEKLY  . Drug Use: No  . Sexual Activity: Not on file   Other Topics Concern  . Not on file   Social History Narrative   Patient is married Jake [name]) and has four children.   Patient lives at home with his wife and children.   Patient is working  At [employer].   Patient has a BA degree.   Patient used both hands.   Patient drinks three cups of coffee daily.    Family History  Problem Relation Age of Onset  . Alcohol abuse    . Asthma    . Depression    . Valvular heart disease    . Osteoporosis Mother   . Parkinson's disease Father     Past Medical  History  Diagnosis Date  . Allergy   . Asthma   . GERD (gastroesophageal reflux disease)     Past Surgical History  Procedure Laterality Date  . Lumbar laminectomy  1997  . Neck surgery  01/2010    c5-7  . Inguinal hernia repair  2009    Current Outpatient Prescriptions  Medication Sig Dispense Refill  . acetaminophen (TYLENOL) 325 MG tablet Take 500 mg by mouth every 6 (six) hours as needed.       Marland Kitchen albuterol (VENTOLIN HFA) 108 (90 BASE) MCG/ACT inhaler Inhale 2 puffs into the lungs as needed.  18 g  2  . aspirin EC 81 MG tablet Take 1 tablet (81 mg total) by mouth daily.  90 tablet  3  . beclomethasone (BECONASE  AQ) 42 MCG/SPRAY nasal spray Place 2 sprays into both nostrils 2 (two) times daily. Dose is for each nostril.  25 g  0  . fluticasone (FLOVENT HFA) 44 MCG/ACT inhaler Inhale 1 puff into the lungs 2 (two) times daily.  3 Inhaler  4  . HYDROcodone-acetaminophen (VICODIN) 5-500 MG per tablet Take 5-325 mg by mouth as needed.       Marland Kitchen ibuprofen (ADVIL,MOTRIN) 400 MG tablet Take 400 mg by mouth every 6 (six) hours as needed.        . loratadine (CLARITIN) 10 MG tablet Take 10 mg by mouth daily.      . psyllium (METAMUCIL) 58.6 % powder Take 1 packet by mouth daily.      . ranitidine (ZANTAC) 150 MG capsule Take 150 mg by mouth daily.       . simvastatin (ZOCOR) 40 MG tablet Take 1 tablet (40 mg total) by mouth daily.  30 tablet  6   No current facility-administered medications for this visit.    Allergies as of 08/30/2013 - Review Complete 08/15/2013  Allergen Reaction Noted  . Other Rash 08/15/2013  . Latex  03/01/2007    Vitals: There were no vitals taken for this visit. Last Weight:  Wt Readings from Last 1 Encounters:  06/15/13 180 lb (81.647 kg)   Last Height:   Ht Readings from Last 1 Encounters:  06/15/13 5\' 10"  (1.778 m)     Physical exam: Exam: Gen: NAD, conversant Eyes: anicteric sclerae, moist conjunctivae HENT: Atraumatic, oropharynx clear Neck: Trachea midline; supple,  Lungs: CTA, no wheezing, rales, rhonic                          CV: RRR, no MRG, no carotid bruits Abdomen: Soft, non-tender;  Extremities: No peripheral edema  Skin: Normal temperature, no rash,  Psych: Appropriate affect, pleasant  Neuro: MS: AA&Ox3, appropriately interactive, normal affect   Speech: fluent w/o paraphasic error  Memory: good recent and remote recall  CN: PERRL, EOMI no nystagmus, no ptosis, sensation intact to LT V1-V3 bilat, face symmetric, no weakness, hearing grossly intact, palate elevates symmetrically, shoulder shrug 5/5 bilat,  tongue protrudes midline, no  fasiculations noted.  Motor: normal bulk and tone Strength: 5/5  In all extremities  Coord: rapid alternating and point-to-point (FNF, HTS) movements intact.  Reflexes: symmetrical, bilat downgoing toes  Sens: LT intact in all extremities  Gait: posture, stance, stride and arm-swing normal. Tandem gait intact. Able to walk on heels and toes. Romberg absent.   Assessment:  After physical and neurologic examination, review of laboratory studies, imaging, neurophysiology testing and pre-existing records, assessment will be reviewed on the problem list.  Plan:  Treatment plan and additional workup will be reviewed under Problem List.  1)Dizziness 2)word finding difficulties/cognitive decline 3)Headache  Mr. Joos is a pleasant 53 year old gentleman presenting for follow up evaluation of episodes of lightheadedness, headache and concern of cognitive decline. Extensive testing has been unremarkable. At this time will continue to monitor. Suspect that the paresthesias in the occipital region are likely related to a mild occipital neuralgia. Follow up in 12 months.    Jim Like, DO  [facility] [address] [place], [place] [address]  Phone [phone] Fax [phone]

## 2013-09-13 ENCOUNTER — Ambulatory Visit: Payer: Self-pay | Admitting: Neurology

## 2013-09-27 ENCOUNTER — Ambulatory Visit: Payer: BC Managed Care – PPO | Admitting: Neurology

## 2013-11-17 ENCOUNTER — Other Ambulatory Visit: Payer: Self-pay | Admitting: *Deleted

## 2013-11-20 ENCOUNTER — Other Ambulatory Visit: Payer: BC Managed Care – PPO

## 2013-11-21 ENCOUNTER — Ambulatory Visit (INDEPENDENT_AMBULATORY_CARE_PROVIDER_SITE_OTHER): Payer: BC Managed Care – PPO | Admitting: Pharmacist

## 2013-11-21 VITALS — Wt 175.0 lb

## 2013-11-21 DIAGNOSIS — E785 Hyperlipidemia, unspecified: Secondary | ICD-10-CM

## 2013-11-21 DIAGNOSIS — Z79899 Other long term (current) drug therapy: Secondary | ICD-10-CM

## 2013-11-21 MED ORDER — ROSUVASTATIN CALCIUM 5 MG PO TABS: 5.0000 mg | ORAL_TABLET | Freq: Every day | ORAL | Status: DC

## 2013-11-21 NOTE — Progress Notes (Signed)
Patient referred to lipid clinic by Dr. Meda Coffee given non-obstructive CAD and elevated CAC (39.82 - which is 80% for age and gender) on Coronary CT and needed to be on statin.  Lp(a) was normal.  Patient most recently was started on simvastatin 20 mg qhs, and after being on it for approximately 1 month, patient developed muscle soreness/weakness and had to stop it.  After being off it for a few days, his muscle pain resolved.   He was started on lipitor 20 mg qd a few months ago, and but developed a rash and muscle aches 06/2013 and stopped lipitor for a period of time to see if there was any improvement.  Patient restarted lipitor soon after but he tells me the muscle soreness symptoms returned after he restarted lipitor and once again had to stop.   Did not get labs prior to this blood work as he was off of simvastatin over one month, and wanted to just come in and discuss other options.  His TG were > 400 in the past which appears to be an isolated elevation, and he tells me he was very sick prior to those last labs.  Risk factors:  Non-obstructive CAD, CAC > 75% for age/gender - LDL goal < 70 mg/dL preferably Medications:  Not on lipid lowering meds. Intolerant:  Lipitor 20 mg qd (muscle aches); simvastatin 20 mg qd (muscle aches)  Social history:  Former smoker (quit 20 years ago), drinks wine - average 5-7 glasses per week. Family history:  Negative for CAD, however father had valvular heart disease.  Diet: .   Low fat breakfast, getting lots of fruits. Was using a juicer for a while.  Lunch - he often skips lunch due to work schedule, so will have a snack for lunch.  Dinner - is good, typically chicken or fish and vegetables.  He and his wife cook healthy dinners at home.  Rarely eats out, never eats fast food.  Doesn't drink soda. Exercise:   Working out at [employer] a few days of the week.  Labs: 10/2013 - didn't get his lab work done as off Simvastatin for over a month 07/2013 - TC 191, TG  436 (strange one time elevation), LDL- n/a, LDL-P 1498, HDL 39, LFTs normal (lipitor 20 mg x 1 week) 05/2013 - TC 203, TG 136, HDL 44, LDL 135, LFTs normal.  Lp(a) < 5 (normal), Glucose 92, renal function normal  - Not on lipid lowering meds - Patient admits to having elevated TG in the past when his appetite was worse, and weight was closer to 190 lbs.  Current Outpatient Prescriptions  Medication Sig Dispense Refill  . acetaminophen (TYLENOL) 325 MG tablet Take 500 mg by mouth every 6 (six) hours as needed.       Marland Kitchen albuterol (VENTOLIN HFA) 108 (90 BASE) MCG/ACT inhaler Inhale 2 puffs into the lungs as needed.  18 g  2  . aspirin EC 81 MG tablet Take 1 tablet (81 mg total) by mouth daily.  90 tablet  3  . beclomethasone (BECONASE AQ) 42 MCG/SPRAY nasal spray Place 2 sprays into both nostrils 2 (two) times daily. Dose is for each nostril.  25 g  0  . fluticasone (FLOVENT HFA) 44 MCG/ACT inhaler Inhale 1 puff into the lungs 2 (two) times daily.  3 Inhaler  4  . HYDROcodone-acetaminophen (VICODIN) 5-500 MG per tablet Take 5-325 mg by mouth as needed.       Marland Kitchen ibuprofen (ADVIL,MOTRIN) 400 MG  tablet Take 400 mg by mouth every 6 (six) hours as needed.        . loratadine (CLARITIN) 10 MG tablet Take 10 mg by mouth daily.      . psyllium (METAMUCIL) 58.6 % powder Take 1 packet by mouth daily.      . ranitidine (ZANTAC) 150 MG capsule Take 150 mg by mouth daily.        No current facility-administered medications for this visit.   Allergies  Allergen Reactions  . Other Rash    Wool pants caused rash   . Latex     REACTION: RASH  . Lipitor [Atorvastatin]     Muscle aches at 20 mg    . Zocor [Simvastatin]     Muscle aches at 20 mg daily

## 2013-11-21 NOTE — Assessment & Plan Note (Addendum)
Patient and I had a long discussion about treatment options.  He was interested in a non-statin option if possible given his h/o muscle aches.  Given his elevated CAC score and non-obstructive disease in a young active patient, would need something to get at least 50% LDL reduction if possible.  Statin or possibly PCSK-9 inhibitor would be best.  He doesn't meet criteria for SPIRE at this time, and the PCSK-9 inhibitors won't likely be approved for a few more months.  Patient agreeable to try low dose Crestor at dose of 5 mg qd.  Will recheck lipid / liver in 8 weeks, then see me soon after.  If tolerating Crestor, will likely continue this.  If he can't tolerate, then may look into getting him on PCSK-9 inhibitor if they get a favorable indication and covered by his BCBS plan.  Patient will continue active lifestyle and low fat diet, and will call me if develops muscle aches or issues with CRestor 5 mg qd.  Samples and vouchers given to patient today. Plan: 1.  Start Crestor 5 mg once daily. 2.  Continue active lifestyle 3.  Check cholesterol in 8 weeks, and also check a CMET.  Fasting lab on 01/15/14 anytime after 7:30 am.  See Ysidro Evert 3 days later to review 01/18/14 at 10:00.  May consider PCSK-9 inhibitor (alirocumab, evolocumab)

## 2013-11-21 NOTE — Patient Instructions (Signed)
1.  Start Crestor 5 mg once daily. 2.  Continue active lifestyle 3.  Check cholesterol in 8 weeks, and also check a CMET.  Fasting lab on 01/15/14 anytime after 7:30 am.  See Ysidro Evert 3 days later to review 01/18/14 at 10:00.  May consider PCSK-9 inhibitor (alirocumab, evolocumab)

## 2013-11-27 ENCOUNTER — Encounter: Payer: Self-pay | Admitting: Cardiology

## 2013-11-27 ENCOUNTER — Ambulatory Visit (INDEPENDENT_AMBULATORY_CARE_PROVIDER_SITE_OTHER): Payer: BC Managed Care – PPO | Admitting: Cardiology

## 2013-11-27 VITALS — BP 132/70 | HR 77 | Ht 69.0 in | Wt 178.0 lb

## 2013-11-27 DIAGNOSIS — M79609 Pain in unspecified limb: Secondary | ICD-10-CM

## 2013-11-27 DIAGNOSIS — R079 Chest pain, unspecified: Secondary | ICD-10-CM

## 2013-11-27 DIAGNOSIS — M542 Cervicalgia: Secondary | ICD-10-CM

## 2013-11-27 DIAGNOSIS — I2510 Atherosclerotic heart disease of native coronary artery without angina pectoris: Secondary | ICD-10-CM

## 2013-11-27 DIAGNOSIS — I25119 Atherosclerotic heart disease of native coronary artery with unspecified angina pectoris: Secondary | ICD-10-CM

## 2013-11-27 DIAGNOSIS — I209 Angina pectoris, unspecified: Secondary | ICD-10-CM

## 2013-11-27 DIAGNOSIS — E785 Hyperlipidemia, unspecified: Secondary | ICD-10-CM

## 2013-11-27 DIAGNOSIS — R42 Dizziness and giddiness: Secondary | ICD-10-CM

## 2013-11-27 DIAGNOSIS — M79629 Pain in unspecified upper arm: Secondary | ICD-10-CM

## 2013-11-27 NOTE — Patient Instructions (Addendum)
Your physician recommends that you continue on your current medications as directed. Please refer to the Current Medication list given to you today.  Your physician recommends that you return for lab work in: December 19, 2013 (LFT, CPK, LIPIDS)  Non-Cardiac CT scanning, (CAT scanning), is a noninvasive, special x-ray that produces cross-sectional images of the body using x-rays and a computer. CT scans help physicians diagnose and treat medical conditions. For some CT exams, a contrast material is used to enhance visibility in the area of the body being studied. CT scans provide greater clarity and reveal more details than regular x-ray exams. ORDERED THIS FOR YOUR NECK AND FOR YOUR CHEST  Your physician wants you to follow-up in: [place] will receive a reminder letter in the mail two months in advance. If you don't receive a letter, please call our office to schedule the follow-up appointment.

## 2013-11-27 NOTE — Progress Notes (Signed)
Patient ID: Jake [name], male   DOB: [birth year], 53 y.o.   MRN: [id number]    Patient Name: Jake [name] Date of Encounter: 11/27/2013  Primary Care Provider:  Joycelyn Man, MD Primary Cardiologist:  Dorothy Spark  Problem List   Past Medical History  Diagnosis Date  . Allergy   . Asthma   . GERD (gastroesophageal reflux disease)    Past Surgical History  Procedure Laterality Date  . Lumbar laminectomy  1997  . Neck surgery  01/2010    c5-7  . Inguinal hernia repair  2009    Allergies  Allergies  Allergen Reactions  . Other Rash    Wool pants caused rash   . Latex     REACTION: RASH  . Lipitor [Atorvastatin]     Muscle aches at 20 mg    . Zocor [Simvastatin]     Muscle aches at 20 mg daily    HPI  Jake [name] is a 53 y.o. male who in the last two weeks presented to the ER twice for episodes of chest pain tthat are retrosternal, sharp, lasting 5-10 seconds and are sometimes associated with exertion. There is no clear exacerbating or alleviating factor. It started when the patient had asthma flare up, but it has been treated and his pain continue to happen on daily basis.  The pain is associated with SOB, headaches and radiates to the neck and left arm. No relief with change of position.  He states that while driving to [place] 2 weeks ago he had a brief moment where he thought he was going to experience a syncopal episode. He is very anxious about it.  The patient denies association with food intake. He denies hypertension, hyperlipidemia, diabetes or family h/o CAD, he quit smoking 20 years ago.  Today he is coming after two weeks. He complains of necks tingling and pain as well as of intermittent pain at the bilateral temporal area. He is seeing a neurologist that is planning to perform brain MRI and transcranial Doppler.  11/27/2013 - no typical chest pain, the patient is going through a lot ofd stress, he has a dying father, who was placed at  hospice and is experiencing occasional non-exertional chest pressure. He was taken off Lipitor and simvastatin for significant muscle pain. He was restarted on Crestor 5 mg daily. He is LFTs were always normal however his CPK was not checked. He's also complaining of is a on and off pain in his left neck and left axilla pain as well as tingling in his left arm. He is a former smoker quit 21 years ago but has no cough right now.  Home Medications  Prior to Admission medications   Medication Sig Start Date End Date Taking? Authorizing Provider  acetaminophen (TYLENOL) 325 MG tablet Take 650 mg by mouth every 6 (six) hours as needed.      Historical Provider, MD  albuterol (VENTOLIN HFA) 108 (90 BASE) MCG/ACT inhaler Inhale 2 puffs into the lungs as needed. 04/12/13   Wardell Honour, MD  beclomethasone (BECONASE AQ) 42 MCG/SPRAY nasal spray Place 2 sprays into both nostrils 2 (two) times daily. Dose is for each nostril. 04/13/13   Dorena Cookey, MD  fluticasone (FLOVENT HFA) 44 MCG/ACT inhaler Inhale 1 puff into the lungs 2 (two) times daily. 04/12/13   Wardell Honour, MD  HYDROcodone-acetaminophen (VICODIN) 5-500 MG per tablet Take 5-500 mg by mouth as needed. 01/28/12   Historical Provider, MD  ibuprofen (ADVIL,MOTRIN) 400 MG tablet Take 400 mg by mouth every 6 (six) hours as needed.      Historical Provider, MD  loratadine (CLARITIN) 10 MG tablet Take 10 mg by mouth daily.    Historical Provider, MD  ranitidine (ZANTAC) 150 MG capsule Take 150 mg by mouth 2 (two) times daily.      Historical Provider, MD    Family History  Family History  Problem Relation Age of Onset  . Alcohol abuse    . Asthma    . Depression    . Valvular heart disease    . Osteoporosis Mother   . Parkinson's disease Father     Social History  History   Social History  . Marital Status: Married    Spouse Name: [name]    Number of Children: 4  . Years of Education: BA   Occupational History  . [employer]S  [employer]    Social History Main Topics  . Smoking status: Former Smoker -- 0.50 packs/day for 20 years    Types: Cigarettes    Quit date: 08/19/1991  . Smokeless tobacco: Never Used  . Alcohol Use: Yes     Comment: 5-7 DRINKS WEEKLY  . Drug Use: No  . Sexual Activity: Not on file   Other Topics Concern  . Not on file   Social History Narrative   Patient is married [name]) and has four children.   Patient lives at home with his wife and children.   Patient is working  At [employer].   Patient has a BA degree.   Patient used both hands.   Patient drinks three cups of coffee daily.     Review of Systems, as per HPI, otherwise negative General:  No chills, fever, night sweats or weight changes.  Cardiovascular:  No chest pain, dyspnea on exertion, edema, orthopnea, palpitations, paroxysmal nocturnal dyspnea. Dermatological: No rash, lesions/masses Respiratory: No cough, dyspnea Urologic: No hematuria, dysuria Abdominal:   No nausea, vomiting, diarrhea, bright red blood per rectum, melena, or hematemesis Neurologic:  No visual changes, wkns, changes in mental status. All other systems reviewed and are otherwise negative except as noted above.  Physical Exam  BP 129/80, HR 93 BPM General: Pleasant, NAD Psych: Normal affect. Neuro: Alert and oriented X 3. Moves all extremities spontaneously. HEENT: Normal  Neck: Supple without bruits or JVD. Lungs:  Resp regular and unlabored, CTA. Heart: RRR no s3, s4, or murmurs. Abdomen: Soft, non-tender, non-distended, BS + x 4.  Extremities: No clubbing, cyanosis or edema. DP/PT/Radials 2+ and equal bilaterally.  Labs:  No results found for this basename: CKTOTAL, CKMB, TROPONINI,  in the last 72 hours Lab Results  Component Value Date   WBC 5.9 04/23/2013   HGB 15.4 04/23/2013   HCT 48.4 04/23/2013   MCV 95.8 04/23/2013   PLT 252.0 08/13/2011   No results found for this basename: NA, K, CL, CO2, BUN,  CREATININE, CALCIUM, LABALBU, PROT, BILITOT, ALKPHOS, ALT, AST, GLUCOSE,  in the last 168 hours  Lipid Panel     Component Value Date/Time   CHOL 203* 05/08/2013 0754   TRIG 436* 08/11/2013 0711   TRIG 136.0 05/08/2013 0754   HDL 44.40 05/08/2013 0754   CHOLHDL 5 05/08/2013 0754   VLDL 27.2 05/08/2013 0754   LDLCALC NOT CALC 08/11/2013 0711   Accessory Clinical Findings  Echocardiogram - none  ECG - sinus bradycardia, 53 beats per minutes, normal EKG.  Coronary CT 04/26/2013  Coronary  arteries:  Left main is a large vessel with minimal non-calcified plague with 0-25 % stenosis. Left main gives rise to LAD, ramus intermedium and LCx artery.  LAD is a large vessel with a mid plague in its ostial portion with associated 25-50% stenosis. There are 2 other calcified plagues in the proximal LAD with associated 25-50% stenosis. LAD only has 0-25% stenosis in its mid and distal segment. First diagonal vessel is rather small and only has mild luminal irregularities.  RI is moderate caliber vessel that further divides into two vessels and gives rise to the first septal branch. These two ramuses supply the entire anterolateral wall and have only mild luminal irregularities.  LCx is a small to moderate caliber non-dominant vessel that gives rise to one very small obtuse marginal branch. There is mild non-calcified plague in the proximal LCx artery associated with 0-25% stenosis.  RCA is a large dominant vessel that gives rise to PDA and PLVB. RCA has a mild calcified plague that is associated with 0-25% stenosis. The rest of RCA has only luminal irregularities. PDA is a large vessel that runs all the way to the apex. There are only luminal irregularities. PLVB is a small vessel without significant stenosis.  Pericardium: Normal  IMPRESSION: 1. Coronary calcium score of 39.82. This was 50 percentile for age and sex matched control.  2. Non-obstructive CAD. Intense medical  management is recommended.  Ena Dawley    Assessment & Plan  53 year old male with   1. Non-obstructive CAD  Chest pain associated with SOB and dizziness. They are somewhat atypical, but have some typical features as radiation to the neck and left arm. A stress test wasn't considered appropriate as he doesn't have exertional chest pain.  The patient underwent a coronary CT on 04/26/2013. His calcium score (40) place him into 37 percentile when matched for age and sex.  There was a non-obstructive plague in all three coronary arteries, most significant was the mixed plague in the proximal LAD associated with 25-50% stenosis. The patient was shown those images upon his request.  The patient falls into high risk category with proven CAD and should be placed on at least moderate dose of highly efficient statin. The patient is very educated and very hesitant.  Intolerance to Lipitor and Zocor, the patient was started on Crestor 5 mg daily We will check CMP, CPK and lipids in 4 weeks.   2. Neck and temporal pain, carotid US was normal B/L,  He is seeing a neurologist that is planning to perform brain MRI and transcranial Doppler.  He now complains of left axillary pain and tingling in his arm that has been going on couple of years. He is very concerned since one of his cousins died of what appears to be lymphoma. We will order a CT scan of his chest and neck to rule out any lymphadenopathy and hypotension line tumor since she is a former smoker if normal he'll be referred to neurologist for management of cervical radiculopathy  3. Blood pressure - normal on no medication  4. Lipid profile - see above   Follow up in 6 months. CMP check in 4 weeks.    Dorothy Spark, MD, [facility] 11/27/2013, 1:07 PM

## 2013-12-04 ENCOUNTER — Other Ambulatory Visit: Payer: Self-pay | Admitting: *Deleted

## 2013-12-04 DIAGNOSIS — R072 Precordial pain: Secondary | ICD-10-CM

## 2013-12-04 NOTE — Addendum Note (Signed)
Addended by: Dorothy Spark on: 12/04/2013 03:36 PM   Modules accepted: Orders

## 2013-12-05 ENCOUNTER — Ambulatory Visit (INDEPENDENT_AMBULATORY_CARE_PROVIDER_SITE_OTHER)
Admission: RE | Admit: 2013-12-05 | Discharge: 2013-12-05 | Disposition: A | Discharge status: Home / Self Care | Payer: BC Managed Care – PPO | Source: Ambulatory Visit | Attending: Cardiology | Admitting: Cardiology

## 2013-12-05 ENCOUNTER — Other Ambulatory Visit: Payer: BC Managed Care – PPO

## 2013-12-05 ENCOUNTER — Telehealth: Payer: Self-pay | Admitting: *Deleted

## 2013-12-05 ENCOUNTER — Telehealth: Payer: Self-pay | Admitting: Pharmacist

## 2013-12-05 DIAGNOSIS — E785 Hyperlipidemia, unspecified: Secondary | ICD-10-CM

## 2013-12-05 DIAGNOSIS — M79629 Pain in unspecified upper arm: Secondary | ICD-10-CM

## 2013-12-05 DIAGNOSIS — I25119 Atherosclerotic heart disease of native coronary artery with unspecified angina pectoris: Secondary | ICD-10-CM

## 2013-12-05 DIAGNOSIS — I209 Angina pectoris, unspecified: Secondary | ICD-10-CM

## 2013-12-05 DIAGNOSIS — M79609 Pain in unspecified limb: Secondary | ICD-10-CM

## 2013-12-05 DIAGNOSIS — I2510 Atherosclerotic heart disease of native coronary artery without angina pectoris: Secondary | ICD-10-CM

## 2013-12-05 DIAGNOSIS — R079 Chest pain, unspecified: Secondary | ICD-10-CM

## 2013-12-05 DIAGNOSIS — M542 Cervicalgia: Secondary | ICD-10-CM

## 2013-12-05 DIAGNOSIS — R072 Precordial pain: Secondary | ICD-10-CM

## 2013-12-05 DIAGNOSIS — R9389 Abnormal findings on diagnostic imaging of other specified body structures: Secondary | ICD-10-CM

## 2013-12-05 DIAGNOSIS — R42 Dizziness and giddiness: Secondary | ICD-10-CM

## 2013-12-05 NOTE — Telephone Encounter (Signed)
Pt was contacted about normal chest x ray per Dr Meda Coffee.  Also informed pt that per Dr Meda Coffee he will need to be set up for a thyroid ultrasound for abnormal appearance and have a repeat CT chest in 6 months for a small nodule.  Informed pt that someone from our scheduling department will be contacting him in the near future to have these appts scheduled.  Pt verbalized understanding and agrees with this plan. Will send [facility] a message to have these appts set up.

## 2013-12-05 NOTE — Telephone Encounter (Signed)
Patient started Crestor 5 mg qd in 10/2013, and he tells me that his muscle aches in his legs returned, just like they did when he took Zocor and lipitor in the past.  His muscle aches are bilateral and in both lower legs and in upper thigh.  Patient is wanting to be done with statins and get on the newer PCSK-9 inhibitors if possible.  It is not known what the indication for this drugs will be if approved until late this month (~ 12/22/13).  Patient was agreeable to try Crestor once weekly for now and call me in 4 weeks and I could tell him the status of the newer agents.  If he tolerates once weekly Crestor 5 mg and can't get on alirocumab, we will likely increase Crestor to biw and check lipid / liver 4-6 weeks later.  He is agreeable to this.  He doesn't want a daily statin if possible however given his h/o statin failure.  To Dr. Meda Coffee as Juluis Rainier only.

## 2013-12-05 NOTE — Telephone Encounter (Signed)
Message copied by Nuala Alpha on Tue Dec 05, 2013  3:19 PM ------      Message from: Dorothy Spark      Created: Tue Dec 05, 2013  2:28 PM       Normal chest X ray. We will need to schedule a thyroid ultrasound for abnormal appearance. Repeat CT chest in 6 months for a small nodule - 7x6 mm in the apex. Please call him. Thank you,      K ------

## 2013-12-07 ENCOUNTER — Ambulatory Visit (INDEPENDENT_AMBULATORY_CARE_PROVIDER_SITE_OTHER): Payer: BC Managed Care – PPO | Admitting: Family Medicine

## 2013-12-07 ENCOUNTER — Ambulatory Visit (HOSPITAL_COMMUNITY)
Admission: RE | Admit: 2013-12-07 | Discharge: 2013-12-07 | Disposition: A | Discharge status: Home / Self Care | Payer: BC Managed Care – PPO | Source: Ambulatory Visit | Attending: Cardiology | Admitting: Cardiology

## 2013-12-07 VITALS — BP 126/72 | HR 78 | Temp 98.2°F | Resp 18 | Ht 69.75 in | Wt 177.8 lb

## 2013-12-07 DIAGNOSIS — R5383 Other fatigue: Principal | ICD-10-CM

## 2013-12-07 DIAGNOSIS — R5381 Other malaise: Secondary | ICD-10-CM

## 2013-12-07 DIAGNOSIS — R9389 Abnormal findings on diagnostic imaging of other specified body structures: Secondary | ICD-10-CM

## 2013-12-07 DIAGNOSIS — E041 Nontoxic single thyroid nodule: Secondary | ICD-10-CM | POA: Insufficient documentation

## 2013-12-07 DIAGNOSIS — J029 Acute pharyngitis, unspecified: Secondary | ICD-10-CM

## 2013-12-07 LAB — POCT RAPID STREP A (OFFICE): Rapid Strep A Screen: NEGATIVE

## 2013-12-07 NOTE — Patient Instructions (Signed)
Your rapid strep is negative.   I will let you know if your culture is positive, but I think you likely have a viral infection (a cold) or perhaps allergies.  Let me know if you are getting worse or if your symptoms change.

## 2013-12-07 NOTE — Progress Notes (Signed)
[facility] [address], [place] [address] [phone]  Date:  12/07/2013   Name:  KRISTOF NADEEM   DOB:  1960-12-01   MRN:  [id number]  PCP:  Joycelyn Man, MD    Chief Complaint: Sore Throat   History of Present Illness:  JAVARES KAUFHOLD is a 53 y.o. very pleasant male patient who presents with the following:  Here today with illness.  He noted onset of a ST and "feeling lousy" a couple of days ago.  He was visiting his father at his assisted living facility recently and was worried that he might have a contagious illness. He also noted occasional sneezing and nasal drainage.   He has not noted a fever- he is taking advil.  Last dose around 0600 today. He has noted a mild cough.   He is feeling fatigued.   He is having an ultrasound of his thyroid today- assuming he does not have strep. No GI symptoms.   No rash.  He is generally healthy  Patient Active Problem List   Diagnosis Date Noted  . Other and unspecified hyperlipidemia 05/16/2013  . External hemorrhoid 05/04/2013  . Chest pain at rest 05/03/2013  . Neck pain 05/03/2013  . Dizziness 05/03/2013  . Atherosclerotic coronary vascular disease 05/03/2013  . Chest pain 04/24/2013  . Prostatitis 04/05/2012  . ASTHMA 10/03/2007  . GERD 10/03/2007  . VENTRAL HERNIA, SMALL 10/03/2007  . Allergic Rhinitis, Cause Unspecified 03/01/2007  . ESOPHAGEAL STRICTURE 03/01/2007  . POLYP, COLON 10/27/2005  . SCHATZKI'S RING, HX OF 10/27/2005    Past Medical History  Diagnosis Date  . Allergy   . Asthma   . GERD (gastroesophageal reflux disease)     Past Surgical History  Procedure Laterality Date  . Lumbar laminectomy  1997  . Neck surgery  01/2010    c5-7  . Inguinal hernia repair  2009    History  Substance Use Topics  . Smoking status: Former Smoker -- 0.50 packs/day for 20 years    Types: Cigarettes    Quit date: 08/19/1991  . Smokeless tobacco: Never Used  . Alcohol Use: Yes      Comment: 5-7 DRINKS WEEKLY    Family History  Problem Relation Age of Onset  . Alcohol abuse    . Asthma    . Depression    . Valvular heart disease    . Osteoporosis Mother   . Parkinson's disease Father     Allergies  Allergen Reactions  . Other Rash    Wool pants caused rash   . Latex     REACTION: RASH  . Lipitor [Atorvastatin]     Muscle aches at 20 mg    . Zocor [Simvastatin]     Muscle aches at 20 mg daily    Medication list has been reviewed and updated.  Current Outpatient Prescriptions on File Prior to Visit  Medication Sig Dispense Refill  . albuterol (VENTOLIN HFA) 108 (90 BASE) MCG/ACT inhaler Inhale 2 puffs into the lungs as needed.  18 g  2  . beclomethasone (BECONASE AQ) 42 MCG/SPRAY nasal spray Place 2 sprays into both nostrils 2 (two) times daily. Dose is for each nostril.  25 g  0  . fluticasone (FLOVENT HFA) 44 MCG/ACT inhaler Inhale 1 puff into the lungs 2 (two) times daily.  3 Inhaler  4  . HYDROcodone-acetaminophen (VICODIN) 5-500 MG per tablet Take 5-325 mg by mouth as needed.       Marland Kitchen  ibuprofen (ADVIL,MOTRIN) 400 MG tablet Take 400 mg by mouth every 6 (six) hours as needed.        . loratadine (CLARITIN) 10 MG tablet Take 10 mg by mouth daily.      . psyllium (METAMUCIL) 58.6 % powder Take 1 packet by mouth daily.      . ranitidine (ZANTAC) 150 MG capsule Take 150 mg by mouth daily.       . rosuvastatin (CRESTOR) 5 MG tablet Take 1 tablet (5 mg total) by mouth daily.  30 tablet  6  . acetaminophen (TYLENOL) 325 MG tablet Take 500 mg by mouth every 6 (six) hours as needed.       Marland Kitchen aspirin EC 81 MG tablet Take 1 tablet (81 mg total) by mouth daily.  90 tablet  3   No current facility-administered medications on file prior to visit.    Review of Systems:  As per HPI- otherwise negative.   Physical Examination: Filed Vitals:   12/07/13 0835  BP: 126/72  Pulse: 78  Temp: 98.2 F (36.8 C)  Resp: 18   Filed Vitals:   12/07/13 0835   Height: 5' 9.75" (1.772 m)  Weight: 177 lb 12.8 oz (80.65 kg)   Body mass index is 25.68 kg/(m^2). Ideal Body Weight: Weight in (lb) to have BMI = 25: 172.6  GEN: WDWN, NAD, Non-toxic, A & O x 3, looks well HEENT: Atraumatic, Normocephalic. Neck supple. No masses, No LAD.  Bilateral TM wnl, oropharynx normal.  PEERL,EOMI.   Ears and Nose: No external deformity. CV: RRR, No M/G/R. No JVD. No thrill. No extra heart sounds. PULM: CTA B, no wheezes, crackles, rhonchi. No retractions. No resp. distress. No accessory muscle use. ABD: S, NT, ND. No rebound. No HSM. EXTR: No c/c/e NEURO Normal gait.  PSYCH: Normally interactive. Conversant. Not depressed or anxious appearing.  Calm demeanor.   Assessment and Plan: Other malaise and fatigue - Plan: Culture, Group A Strep  Acute pharyngitis, unspecified pharyngitis type - Plan: POCT rapid strep A, Culture, Group A Strep  Likely viral URI.  Supportive care, follow-up as needed See patient instructions for more details.      Signed Lamar Blinks, MD

## 2013-12-08 ENCOUNTER — Encounter: Payer: Self-pay | Admitting: *Deleted

## 2013-12-08 NOTE — Telephone Encounter (Signed)
Pt was contacted about his thyroid nodules showing very small and requiring no biopsy at this time per Dr Meda Coffee.  Informed pt that no further work-up is needed, as his thyroid function is normal as well per Dr Meda Coffee.  Pt verbalized understanding and pleased with the follow-up.

## 2013-12-08 NOTE — Telephone Encounter (Signed)
Message copied by Nuala Alpha on Fri Dec 08, 2013  4:50 PM ------      Message from: Dorothy Spark      Created: Thu Dec 07, 2013  5:59 PM       Please let him know that his thyroid nodules are very small and don't need any biopsy. No further work up is needed as his thyroid function is normal as well. ------

## 2013-12-09 LAB — CULTURE, GROUP A STREP: Organism ID, Bacteria: NORMAL

## 2013-12-19 ENCOUNTER — Other Ambulatory Visit: Payer: BC Managed Care – PPO

## 2014-01-15 ENCOUNTER — Other Ambulatory Visit: Payer: BC Managed Care – PPO

## 2014-01-18 ENCOUNTER — Ambulatory Visit: Payer: BC Managed Care – PPO | Admitting: Pharmacist

## 2014-01-29 ENCOUNTER — Emergency Department (HOSPITAL_COMMUNITY): Payer: BC Managed Care – PPO

## 2014-01-29 ENCOUNTER — Emergency Department (HOSPITAL_COMMUNITY)
Admission: EM | Admit: 2014-01-29 | Discharge: 2014-01-29 | Disposition: A | Discharge status: Home / Self Care | Payer: BC Managed Care – PPO | Attending: Emergency Medicine | Admitting: Emergency Medicine

## 2014-01-29 ENCOUNTER — Encounter (HOSPITAL_COMMUNITY): Payer: Self-pay | Admitting: Emergency Medicine

## 2014-01-29 ENCOUNTER — Telehealth: Payer: Self-pay | Admitting: Cardiology

## 2014-01-29 DIAGNOSIS — R079 Chest pain, unspecified: Secondary | ICD-10-CM | POA: Diagnosis present

## 2014-01-29 DIAGNOSIS — Z79899 Other long term (current) drug therapy: Secondary | ICD-10-CM | POA: Insufficient documentation

## 2014-01-29 DIAGNOSIS — J45909 Unspecified asthma, uncomplicated: Secondary | ICD-10-CM | POA: Insufficient documentation

## 2014-01-29 DIAGNOSIS — Z87891 Personal history of nicotine dependence: Secondary | ICD-10-CM | POA: Insufficient documentation

## 2014-01-29 DIAGNOSIS — R0602 Shortness of breath: Secondary | ICD-10-CM | POA: Insufficient documentation

## 2014-01-29 DIAGNOSIS — IMO0002 Reserved for concepts with insufficient information to code with codable children: Secondary | ICD-10-CM | POA: Diagnosis not present

## 2014-01-29 DIAGNOSIS — R51 Headache: Secondary | ICD-10-CM | POA: Diagnosis not present

## 2014-01-29 DIAGNOSIS — Z91040 Latex allergy status: Secondary | ICD-10-CM | POA: Insufficient documentation

## 2014-01-29 DIAGNOSIS — K219 Gastro-esophageal reflux disease without esophagitis: Secondary | ICD-10-CM | POA: Diagnosis not present

## 2014-01-29 DIAGNOSIS — M542 Cervicalgia: Secondary | ICD-10-CM | POA: Diagnosis not present

## 2014-01-29 LAB — BASIC METABOLIC PANEL
Anion gap: 13 (ref 5–15)
BUN: 13 mg/dL (ref 6–23)
CO2: 26 mEq/L (ref 19–32)
Calcium: 9.2 mg/dL (ref 8.4–10.5)
Chloride: 102 mEq/L (ref 96–112)
Creatinine, Ser: 0.85 mg/dL (ref 0.50–1.35)
GFR calc Af Amer: >90 mL/min (ref >90)
GFR calc non Af Amer: >90 mL/min (ref >90)
Glucose, Bld: 97 mg/dL (ref 70–99)
Potassium: 3.8 mEq/L (ref 3.7–5.3)
Sodium: 141 mEq/L (ref 137–147)

## 2014-01-29 LAB — I-STAT TROPONIN, ED: Troponin i, poc: 0 ng/mL (ref 0.00–0.08)

## 2014-01-29 LAB — CBC
HCT: 40.3 % (ref 39.0–52.0)
Hemoglobin: 14.3 g/dL (ref 13.0–17.0)
MCH: 30.9 pg (ref 26.0–34.0)
MCHC: 35.5 g/dL (ref 30.0–36.0)
MCV: 87 fL (ref 78.0–100.0)
Platelets: 251 10*3/uL (ref 150–400)
RBC: 4.63 MIL/uL (ref 4.22–5.81)
RDW: 11.7 % (ref 11.5–15.5)
WBC: 6.4 10*3/uL (ref 4.0–10.5)

## 2014-01-29 MED ORDER — ASPIRIN 81 MG PO CHEW
324.0000 mg | CHEWABLE_TABLET | Freq: Once | ORAL | Status: AC
  Administered 2014-01-29: 324 mg via ORAL
  Filled 2014-01-29: qty 4

## 2014-01-29 NOTE — Telephone Encounter (Signed)
New message     Pt has uncomfortable feeling in chest---pressure and pain combined.  It is happening now.

## 2014-01-29 NOTE — Discharge Instructions (Signed)
Followup with cardiology next few days. Today's workup without any significant findings. No evidence of any acute cardiac event. As we discussed your x-ray did show some mild bronchospasm. Using your albuterol inhaler could be helpful. Return for any new or worse symptoms.

## 2014-01-29 NOTE — ED Provider Notes (Signed)
CSN: [id number]     Arrival date & time 01/29/14  1155 History   First MD Initiated Contact with Patient 01/29/14 1222     Chief Complaint  Patient presents with  . Chest Pain     (Consider location/radiation/quality/duration/timing/severity/associated sxs/prior Treatment) Patient is a 53 y.o. male presenting with chest pain. The history is provided by the patient.  Chest Pain Associated symptoms: shortness of breath   Associated symptoms: no abdominal pain, no back pain, no fever, no headache, no nausea and not vomiting    Patient with 3 day history of constant chest pain left lateral chest radiating to neck and top of shoulder. Associated with some mild shortness of breath no nausea no vomiting. Patient is followed by cardiology to having chest pain problems over the last several months. CT angios chest in June.    Past Medical History  Diagnosis Date  . Allergy   . Asthma   . GERD (gastroesophageal reflux disease)    Past Surgical History  Procedure Laterality Date  . Lumbar laminectomy  1997  . Neck surgery  01/2010    c5-7  . Inguinal hernia repair  2009   Family History  Problem Relation Age of Onset  . Alcohol abuse    . Asthma    . Depression    . Valvular heart disease    . Osteoporosis Mother   . Parkinson's disease Father    History  Substance Use Topics  . Smoking status: Former Smoker -- 0.50 packs/day for 20 years    Types: Cigarettes    Quit date: 08/19/1991  . Smokeless tobacco: Never Used  . Alcohol Use: Yes     Comment: 5-7 DRINKS WEEKLY    Review of Systems  Constitutional: Negative for fever.  HENT: Negative for congestion.   Eyes: Negative for visual disturbance.  Respiratory: Positive for shortness of breath.   Cardiovascular: Positive for chest pain.  Gastrointestinal: Negative for nausea, vomiting and abdominal pain.  Genitourinary: Negative for dysuria.  Musculoskeletal: Positive for neck pain. Negative for back pain.  Skin:  Negative for rash.  Neurological: Negative for headaches.  Hematological: Does not bruise/bleed easily.  Psychiatric/Behavioral: Negative for confusion.      Allergies  Other; Latex; Lipitor; and Zocor  Home Medications   Prior to Admission medications   Medication Sig Start Date End Date Taking? Authorizing Provider  acetaminophen (TYLENOL) 325 MG tablet Take 500 mg by mouth every 6 (six) hours as needed for mild pain.    Yes Historical Provider, MD  albuterol (PROVENTIL HFA;VENTOLIN HFA) 108 (90 BASE) MCG/ACT inhaler Inhale 1 puff into the lungs every 6 (six) hours as needed for wheezing or shortness of breath.   Yes Historical Provider, MD  fluticasone (FLOVENT HFA) 44 MCG/ACT inhaler Inhale 1 puff into the lungs 2 (two) times daily. 04/12/13  Yes Wardell Honour, MD  HYDROcodone-acetaminophen (VICODIN) 5-500 MG per tablet Take 5-325 mg by mouth as needed for pain.  01/28/12  Yes Historical Provider, MD  ibuprofen (ADVIL,MOTRIN) 400 MG tablet Take 400 mg by mouth every 6 (six) hours as needed for mild pain.    Yes Historical Provider, MD  ranitidine (ZANTAC) 150 MG capsule Take 150 mg by mouth daily.    Yes Historical Provider, MD  rosuvastatin (CRESTOR) 5 MG tablet Take 5 mg by mouth once a week. Every Sunday   Yes Historical Provider, MD   BP 125/81  Pulse 72  Temp(Src) 97.5 F (36.4 C) (Oral)  Resp 16  Ht 5' 9.5" (1.765 m)  Wt 175 lb (79.379 kg)  BMI 25.48 kg/m2  SpO2 98% Physical Exam  Vitals reviewed. Constitutional: He is oriented to person, place, and time. He appears well-developed and well-nourished. No distress.  HENT:  Head: Normocephalic and atraumatic.  Mouth/Throat: Oropharynx is clear and moist.  Eyes: Conjunctivae and EOM are normal. Pupils are equal, round, and reactive to light.  Neck: Normal range of motion. Neck supple.  Cardiovascular: Normal rate, regular rhythm and normal heart sounds.   Pulmonary/Chest: Effort normal and breath sounds normal. No  respiratory distress.  Abdominal: Soft. Bowel sounds are normal. There is no tenderness.  Musculoskeletal: He exhibits no edema.  Neurological: He is alert and oriented to person, place, and time. No cranial nerve deficit. He exhibits normal muscle tone. Coordination normal.  Skin: Skin is warm. No rash noted.    ED Course  Procedures (including critical care time) Labs Review Labs Reviewed  CBC  BASIC METABOLIC PANEL  I-STAT [place], ED    Imaging Review Dg Chest 2 View  01/29/2014   CLINICAL DATA:  Chest pain, history asthma, GERD  EXAM: CHEST  2 VIEW  COMPARISON:  12/05/2013  FINDINGS: Normal heart size, mediastinal contours, and pulmonary vascularity.  Minimal central peribronchial thickening.  Lungs clear.  No pleural effusion or pneumothorax.  Prior cervical spine fusion.  No acute bony findings.  IMPRESSION: Minimal chronic peribronchial thickening which could be related to bronchitis or asthma.  No acute abnormalities.   Electronically Signed   By: Lavonia Dana M.D.   On: 01/29/2014 13:37     EKG Interpretation   Date/Time:  Monday January 29 2014 12:00:34 EDT Ventricular Rate:  73 PR Interval:  196 QRS Duration: 108 QT Interval:  398 QTC Calculation: 438 R Axis:   85 Text Interpretation:  Normal sinus rhythm Incomplete right bundle branch  block Borderline ECG Confirmed by Jizel Cheeks  MD, Avion Patella ([id number]) on  01/29/2014 12:29:53 PM      MDM   Final diagnoses:  Chest pain, unspecified chest pain type    Patient followed by [facility] cardiology. Patient's been having some chest pain problems for the past several months periods have pre-much constant chest pain for the past 3 days. Cardiology did do ACT coronary artery angiogram June which did not show significant disease showed some plaque problems 25-50%. Cardiology plan was medical management. Patient's workup here today troponin was negative EKG without acute changes. This is not consistent with a significant cardiac chest  pain based on today's labs after 3 days of symptoms. Patient to follow back up with cardiology. Chest x-ray did raise some concerns for some bronchospasm patient does have a history of asthma since he was younger does use an albuterol inhaler.    Fredia Sorrow, MD 01/29/14 [phone]

## 2014-01-29 NOTE — ED Notes (Signed)
Patient states father died last month and he has been upset by life event.

## 2014-01-29 NOTE — ED Notes (Signed)
Patient states started having chest pain x 3 days ago.  Patient states L chest pain with radiation to L shoulder.   Patient also complains of L temporal headache.

## 2014-01-29 NOTE — Telephone Encounter (Signed)
Pt calling to let Dr Meda Coffee know he is experiencing a "weird sensation in his chest, and has a dry cough." Pt is convinced that something is wrong with his heart.  Pt denies any sob at this time.  Pt states his HR is 74.  Pt states that he has asthma, and is certain that its not his asthma causing this weird sensation. Pt states he does have a continuous dry cough.  Asked pt if he is having palpitations, cp, feeling faint or dizzy, pale, diaphoretic, or in any acute distress at this time.  Pt denies all of the above, but is persistent that its his heart.  Pt is demanding an appointment with Dr Meda Coffee today.  Informed the pt that Dr Meda Coffee is out of the office today.  Advised pt given his complaints, he should try using his albuterol inhaler to help with the continuous dry cough.  Pt states he doesn't like to use it because it makes him feel jittery.  Pt requesting a full cardiac work-up to be done.  Advised pt to call his PCP to see if they can work him in today, so he can get a full head-to-toe assessment.  Informed pt that his PCP can as well take an EKG and blood work, and assess if its his heart or his asthma.  Pt states he does not want to go to his PCP, he will be referring himself to [facility] ED for a full cardiac work-up.  Informed pt that I respect his decision and will notify Dr Meda Coffee and the Card Master of the pt coming to the ED.  Pt verbalized understanding and agrees with the plan.

## 2014-03-29 ENCOUNTER — Telehealth: Payer: Self-pay | Admitting: Gastroenterology

## 2014-03-29 NOTE — Telephone Encounter (Signed)
The pt is aware and thanked me for calling

## 2014-03-29 NOTE — Telephone Encounter (Signed)
Left message on machine to call back COLON DUE 10/2015

## 2014-04-06 ENCOUNTER — Ambulatory Visit (INDEPENDENT_AMBULATORY_CARE_PROVIDER_SITE_OTHER): Payer: BC Managed Care – PPO | Admitting: *Deleted

## 2014-04-06 ENCOUNTER — Ambulatory Visit: Payer: BC Managed Care – PPO | Admitting: *Deleted

## 2014-04-06 DIAGNOSIS — Z23 Encounter for immunization: Secondary | ICD-10-CM

## 2014-06-19 ENCOUNTER — Ambulatory Visit (INDEPENDENT_AMBULATORY_CARE_PROVIDER_SITE_OTHER)
Admission: RE | Admit: 2014-06-19 | Discharge: 2014-06-19 | Disposition: A | Discharge status: Home / Self Care | Payer: BLUE CROSS/BLUE SHIELD | Source: Ambulatory Visit | Attending: Cardiology | Admitting: Cardiology

## 2014-06-19 ENCOUNTER — Encounter: Payer: Self-pay | Admitting: Gastroenterology

## 2014-06-19 DIAGNOSIS — R946 Abnormal results of thyroid function studies: Secondary | ICD-10-CM

## 2014-06-19 DIAGNOSIS — R9389 Abnormal findings on diagnostic imaging of other specified body structures: Secondary | ICD-10-CM

## 2014-06-19 DIAGNOSIS — R938 Abnormal findings on diagnostic imaging of other specified body structures: Secondary | ICD-10-CM

## 2014-06-20 ENCOUNTER — Telehealth: Payer: Self-pay | Admitting: *Deleted

## 2014-06-20 DIAGNOSIS — R911 Solitary pulmonary nodule: Secondary | ICD-10-CM

## 2014-06-20 NOTE — Telephone Encounter (Signed)
-----   Message from Dorothy Spark, MD sent at 06/20/2014  9:42 AM EST ----- Stable size of nodules, we will need one more follow up in 1 year

## 2014-06-20 NOTE — Telephone Encounter (Signed)
Notified the pt with his chest CT results per Dr Meda Coffee.  Informed the pt that per Dr Meda Coffee he has stable size of nodules, and we will need one more follow-up in one year.  Informed the pt that the order will be placed into the system, and I will send our [facility] schedulers a message to contact the pt to have this set up for one year out.  Pt verbalized understanding and agrees with this plan.

## 2014-07-04 ENCOUNTER — Ambulatory Visit (INDEPENDENT_AMBULATORY_CARE_PROVIDER_SITE_OTHER): Payer: BLUE CROSS/BLUE SHIELD | Admitting: Cardiology

## 2014-07-04 ENCOUNTER — Encounter: Payer: Self-pay | Admitting: Cardiology

## 2014-07-04 VITALS — BP 122/72 | HR 79 | Ht 69.5 in | Wt 182.0 lb

## 2014-07-04 DIAGNOSIS — I2510 Atherosclerotic heart disease of native coronary artery without angina pectoris: Secondary | ICD-10-CM | POA: Insufficient documentation

## 2014-07-04 DIAGNOSIS — I25119 Atherosclerotic heart disease of native coronary artery with unspecified angina pectoris: Secondary | ICD-10-CM

## 2014-07-04 DIAGNOSIS — I2583 Coronary atherosclerosis due to lipid rich plaque: Secondary | ICD-10-CM

## 2014-07-04 DIAGNOSIS — E785 Hyperlipidemia, unspecified: Secondary | ICD-10-CM

## 2014-07-04 LAB — COMPREHENSIVE METABOLIC PANEL
ALT: 26 U/L (ref 0–53)
AST: 27 U/L (ref 0–37)
Albumin: 4.6 g/dL (ref 3.5–5.2)
Alkaline Phosphatase: 64 U/L (ref 39–117)
BUN: 12 mg/dL (ref 6–23)
CO2: 29 mEq/L (ref 19–32)
Calcium: 9.3 mg/dL (ref 8.4–10.5)
Chloride: 105 mEq/L (ref 96–112)
Creatinine, Ser: 0.94 mg/dL (ref 0.40–1.50)
GFR: 88.8 mL/min (ref >60.00)
Glucose, Bld: 93 mg/dL (ref 70–99)
Potassium: 3.9 mEq/L (ref 3.5–5.1)
Sodium: 138 mEq/L (ref 135–145)
Total Bilirubin: 0.8 mg/dL (ref 0.2–1.2)
Total Protein: 7.1 g/dL (ref 6.0–8.3)

## 2014-07-04 LAB — HEPATIC FUNCTION PANEL
ALT: 26 U/L (ref 0–53)
AST: 27 U/L (ref 0–37)
Albumin: 4.6 g/dL (ref 3.5–5.2)
Alkaline Phosphatase: 64 U/L (ref 39–117)
Bilirubin, Direct: 0.1 mg/dL (ref 0.0–0.3)
Total Bilirubin: 0.8 mg/dL (ref 0.2–1.2)
Total Protein: 7.1 g/dL (ref 6.0–8.3)

## 2014-07-04 NOTE — Progress Notes (Signed)
Patient ID: Jake [name], male   DOB: [birth year], 54 y.o.   MRN: [id number] Patient ID: Jake [name], male   DOB: [birth year], 54 y.o.   MRN: [id number]    Patient Name: Jake [name] Date of Encounter: 07/04/2014  Primary Care Provider:  Joycelyn Man, MD Primary Cardiologist:  Dorothy Spark  Problem List   Past Medical History  Diagnosis Date  . Allergy   . Asthma   . GERD (gastroesophageal reflux disease)    Past Surgical History  Procedure Laterality Date  . Lumbar laminectomy  1997  . Neck surgery  01/2010    c5-7  . Inguinal hernia repair  2009    Allergies  Allergies  Allergen Reactions  . Other Rash    Wool pants caused rash   . Latex     REACTION: RASH  . Lipitor [Atorvastatin]     Muscle aches at 20 mg    . Zocor [Simvastatin]     Muscle aches at 20 mg daily    HPI  Jake [name] is a 54 y.o. male who in the last two weeks presented to the ER twice for episodes of chest pain tthat are retrosternal, sharp, lasting 5-10 seconds and are sometimes associated with exertion. There is no clear exacerbating or alleviating factor. It started when the patient had asthma flare up, but it has been treated and his pain continue to happen on daily basis.  The pain is associated with SOB, headaches and radiates to the neck and left arm. No relief with change of position.  He states that while driving to [place] 2 weeks ago he had a brief moment where he thought he was going to experience a syncopal episode. He is very anxious about it.  The patient denies association with food intake. He denies hypertension, hyperlipidemia, diabetes or family h/o CAD, he quit smoking 20 years ago.  Today he is coming after two weeks. He complains of necks tingling and pain as well as of intermittent pain at the bilateral temporal area. He is seeing a neurologist that is planning to perform brain MRI and transcranial Doppler.  11/27/2013 - no typical chest pain, the patient  is going through a lot ofd stress, he has a dying father, who was placed at hospice and is experiencing occasional non-exertional chest pressure. He was taken off Lipitor and simvastatin for significant muscle pain. He was restarted on Crestor 5 mg daily. He is LFTs were always normal however his CPK was not checked. He's also complaining of is a on and off pain in his left neck and left axilla pain as well as tingling in his left arm. He is a former smoker quit 21 years ago but has no cough right now.  07/04/2014 - the patient is coming after 6 months, he is doing very well he denies any chest pain or shortness of breath. He signed up for a personal trainer and started exercising. He underwent repeat chest CT for the last year and both incidental nodules in his lungs are the same size. it radiology recommends repeat in one year. he stopped taking his crestor once a week as this gave him significant muscle and joint pain.  Home Medications  Prior to Admission medications   Medication Sig Start Date End Date Taking? Authorizing Provider  acetaminophen (TYLENOL) 325 MG tablet Take 650 mg by mouth every 6 (six) hours as needed.      Historical Provider, MD  albuterol (VENTOLIN HFA) 108 (  90 BASE) MCG/ACT inhaler Inhale 2 puffs into the lungs as needed. 04/12/13   Wardell Honour, MD  beclomethasone (BECONASE AQ) 42 MCG/SPRAY nasal spray Place 2 sprays into both nostrils 2 (two) times daily. Dose is for each nostril. 04/13/13   Dorena Cookey, MD  fluticasone (FLOVENT HFA) 44 MCG/ACT inhaler Inhale 1 puff into the lungs 2 (two) times daily. 04/12/13   Wardell Honour, MD  HYDROcodone-acetaminophen (VICODIN) 5-500 MG per tablet Take 5-500 mg by mouth as needed. 01/28/12   Historical Provider, MD  ibuprofen (ADVIL,MOTRIN) 400 MG tablet Take 400 mg by mouth every 6 (six) hours as needed.      Historical Provider, MD  loratadine (CLARITIN) 10 MG tablet Take 10 mg by mouth daily.    Historical Provider, MD    ranitidine (ZANTAC) 150 MG capsule Take 150 mg by mouth 2 (two) times daily.      Historical Provider, MD    Family History  Family History  Problem Relation Age of Onset  . Alcohol abuse    . Asthma    . Depression    . Valvular heart disease    . Osteoporosis Mother   . Parkinson's disease Father     Social History  History   Social History  . Marital Status: Married    Spouse Name: [name]    Number of Children: 4  . Years of Education: BA   Occupational History  . [employer]    Social History Main Topics  . Smoking status: Former Smoker -- 0.50 packs/day for 20 years    Types: Cigarettes    Quit date: 08/19/1991  . Smokeless tobacco: Never Used  . Alcohol Use: Yes     Comment: 5-7 DRINKS WEEKLY  . Drug Use: No  . Sexual Activity: Not on file   Other Topics Concern  . Not on file   Social History Narrative   Patient is married [name]) and has four children.   Patient lives at home with his wife and children.   Patient is working  At [employer].   Patient has a BA degree.   Patient used both hands.   Patient drinks three cups of coffee daily.     Review of Systems, as per HPI, otherwise negative General:  No chills, fever, night sweats or weight changes.  Cardiovascular:  No chest pain, dyspnea on exertion, edema, orthopnea, palpitations, paroxysmal nocturnal dyspnea. Dermatological: No rash, lesions/masses Respiratory: No cough, dyspnea Urologic: No hematuria, dysuria Abdominal:   No nausea, vomiting, diarrhea, bright red blood per rectum, melena, or hematemesis Neurologic:  No visual changes, wkns, changes in mental status. All other systems reviewed and are otherwise negative except as noted above.  Physical Exam  BP 129/80, HR 93 BPM General: Pleasant, NAD Psych: Normal affect. Neuro: Alert and oriented X 3. Moves all extremities spontaneously. HEENT: Normal  Neck: Supple without bruits or JVD. Lungs:  Resp regular and  unlabored, CTA. Heart: RRR no s3, s4, or murmurs. Abdomen: Soft, non-tender, non-distended, BS + x 4.  Extremities: No clubbing, cyanosis or edema. DP/PT/Radials 2+ and equal bilaterally.  Labs:  No results for input(s): CKTOTAL, CKMB, TROPONINI in the last 72 hours. Lab Results  Component Value Date   WBC 6.4 01/29/2014   HGB 14.3 01/29/2014   HCT 40.3 01/29/2014   MCV 87.0 01/29/2014   PLT 251 01/29/2014   No results for input(s): NA, K, CL, CO2, BUN, CREATININE, CALCIUM, PROT, BILITOT,  ALKPHOS, ALT, AST, GLUCOSE in the last 168 hours.  Invalid input(s): LABALBU  Lipid Panel     Component Value Date/Time   CHOL 203* 05/08/2013 0754   TRIG 436* 08/11/2013 0711   TRIG 136.0 05/08/2013 0754   HDL 44.40 05/08/2013 0754   CHOLHDL 5 05/08/2013 0754   VLDL 27.2 05/08/2013 0754   LDLCALC NOT CALC 08/11/2013 0711   Accessory Clinical Findings  Echocardiogram - none  ECG - sinus bradycardia, 53 beats per minutes, normal EKG.  Coronary CT 04/26/2013  Coronary arteries:  Left main is a large vessel with minimal non-calcified plague with 0-25 % stenosis. Left main gives rise to LAD, ramus intermedium and LCx artery.  LAD is a large vessel with a mid plague in its ostial portion with associated 25-50% stenosis. There are 2 other calcified plagues in the proximal LAD with associated 25-50% stenosis. LAD only has 0-25% stenosis in its mid and distal segment. First diagonal vessel is rather small and only has mild luminal irregularities.  RI is moderate caliber vessel that further divides into two vessels and gives rise to the first septal branch. These two ramuses supply the entire anterolateral wall and have only mild luminal irregularities.  LCx is a small to moderate caliber non-dominant vessel that gives rise to one very small obtuse marginal branch. There is mild non-calcified plague in the proximal LCx artery associated with 0-25% stenosis.  RCA is a large dominant  vessel that gives rise to PDA and PLVB. RCA has a mild calcified plague that is associated with 0-25% stenosis. The rest of RCA has only luminal irregularities. PDA is a large vessel that runs all the way to the apex. There are only luminal irregularities. PLVB is a small vessel without significant stenosis.  Pericardium: Normal  IMPRESSION: 1. Coronary calcium score of 39.82. This was 56 percentile for age and sex matched control.  2. Non-obstructive CAD. Intense medical management is recommended.  Ena Dawley    Assessment & Plan  54 year old male with   1. Non-obstructive CAD  Chest pain associated with SOB and dizziness. They are somewhat atypical, but have some typical features as radiation to the neck and left arm. A stress test wasn't considered appropriate as he doesn't have exertional chest pain.  The patient underwent a coronary CT on 04/26/2013. His calcium score (40) place him into 35 percentile when matched for age and sex.  There was a non-obstructive plague in all three coronary arteries, most significant was the mixed plague in the proximal LAD associated with 25-50% stenosis. The patient was shown those images upon his request.  The patient falls into high risk category with proven CAD and should be placed on at least moderate dose of highly efficient statin. The patient is very educated and very hesitant.  Intolerance to Lipitor and Zocor, and Crestor 5 mg once weekly. With the diagnosis of known coronary artery disease it is pertinent that he needs to be started on any lipid-lowering management.  2. Neck and temporal pain, carotid US was normal B/L,  He is seeing a neurologist that is planning to perform brain MRI and transcranial Doppler.  He now complains of left axillary pain and tingling in his arm that has been going on couple of years. He is very concerned since one of his cousins died of what appears to be lymphoma. We will order a CT scan of his chest  and neck to rule out any lymphadenopathy and hypotension line tumor since  she is a former smoker if normal he'll be referred to neurologist for management of cervical radiculopathy  3. Blood pressure - normal on no medication  4. Lipid profile -  Intolerance to Lipitor and Zocor, and Crestor 5 mg once weekly. With the diagnosis of known coronary artery disease it is pertinent that he needs to be started on any lipid-lowering management. We will refer to lipid clinic for a consideration of PCI skin 9 inhibitors.  Follow up in 1 year, CMP and NMR lipids today.   Dorothy Spark, MD, [facility] 07/04/2014, 8:34 AM

## 2014-07-04 NOTE — Patient Instructions (Signed)
Your physician recommends that you continue on your current medications as directed. Please refer to the Current Medication list given to you today.    Your physician recommends that you return for lab work in: TODAY (BMET, LFTs, NMR W LIPIDS)   Non-Cardiac CT scanning, (CAT scanning), is a noninvasive, special x-ray that produces cross-sectional images of the body using x-rays and a computer. CT scans help physicians diagnose and treat medical conditions. For some CT exams, a contrast material is used to enhance visibility in the area of the body being studied. CT scans provide greater clarity and reveal more details than regular x-ray exams.  DR NELSON HAS ORDERED FOR YOU TO HAVE A CHEST CT FOR ONE YEAR OUT.   You have been referred to [place]    Your physician wants you to follow-up in: [place] will receive a reminder letter in the mail two months in advance. If you don't receive a letter, please call our office to schedule the follow-up appointment.

## 2014-07-05 ENCOUNTER — Telehealth: Payer: Self-pay | Admitting: Cardiology

## 2014-07-05 ENCOUNTER — Ambulatory Visit (INDEPENDENT_AMBULATORY_CARE_PROVIDER_SITE_OTHER): Payer: BLUE CROSS/BLUE SHIELD | Admitting: Pharmacist Clinician (PhC)/ Clinical Pharmacy Specialist

## 2014-07-05 ENCOUNTER — Encounter: Payer: Self-pay | Admitting: Pharmacist Clinician (PhC)/ Clinical Pharmacy Specialist

## 2014-07-05 VITALS — Ht 69.5 in | Wt 182.0 lb

## 2014-07-05 DIAGNOSIS — E785 Hyperlipidemia, unspecified: Secondary | ICD-10-CM

## 2014-07-05 NOTE — Progress Notes (Signed)
Patient referred to lipid clinic by Dr. Meda Coffee given non-obstructive CAD and elevated CAC (39.82 - which is 80% for age and gender) on Coronary CT and needed to be on statin.  Lp(a) was normal.  Patient has tried and failed multiple statins including simvastatin 20 mg qhs, (muscle soreness/weakness after 1 month), lipitor 20 mg qd (rash and muscle aches - pt stopped then re-challenged after several weeks with same results) and Crestor 5 mg.  The Crestor was originally qd, but after muscle problems developed he held the medication for several weeks then restarted at 5 mg once weekly.  Still had to discontinue due to muscle aches.  He had labs drawn yesterday, unfortunately those were not available at time of visit.  His TG were > 400 in the past which appears to be an isolated elevation, and he tells me he was very sick prior to those last labs.  Risk factors:  Non-obstructive CAD, CAC > 75% for age/gender - LDL goal < 70 mg/dL preferably Medications:  Not on lipid lowering meds. Intolerant:  Lipitor 20 mg qd (muscle aches); simvastatin 20 mg qd (muscle aches); Crestor 5 mg qweek (muscle aches)  Social history:  Former smoker (quit 20 years ago), drinks wine - occasional, none since New Year. Family history:  Negative for CAD, however father had valvular heart disease.  Diet: .   Eggs many mornings.  Lunch - he often skips lunch due to work schedule, so will have a snack for lunch.  Dinner - is good, typically chicken or fish and vegetables.  He and his wife cook healthy dinners at home.  Rarely eats out, never eats fast food.  Doesn't drink soda. Exercise:   Hasn't done much in past year since father passed away, just started working with personal trainer in past couple of weeks  Labs: 07/2014 -  07/2013 - TC 191, TG 436 (strange one time elevation), LDL- n/a, LDL-P 1498, HDL 39, LFTs normal (lipitor 20 mg x 1 week) 05/2013 - TC 203, TG 136, HDL 44, LDL 135, LFTs normal.  Lp(a) < 5 (normal), Glucose 92,  renal function normal  - Not on lipid lowering meds - Patient admits to having elevated TG in the past when his appetite was worse, and weight was closer to 190 lbs.  Current Outpatient Prescriptions  Medication Sig Dispense Refill  . acetaminophen (TYLENOL) 325 MG tablet Take 500 mg by mouth every 6 (six) hours as needed for mild pain.     Marland Kitchen albuterol (PROVENTIL HFA;VENTOLIN HFA) 108 (90 BASE) MCG/ACT inhaler Inhale 1 puff into the lungs every 6 (six) hours as needed for wheezing or shortness of breath.    . fluticasone (FLOVENT HFA) 44 MCG/ACT inhaler Inhale 1 puff into the lungs 2 (two) times daily. 3 Inhaler 4  . HYDROcodone-acetaminophen (NORCO/VICODIN) 5-325 MG per tablet Take 1 tablet by mouth as needed.  0  . ibuprofen (ADVIL,MOTRIN) 400 MG tablet Take 400 mg by mouth every 6 (six) hours as needed for mild pain.      No current facility-administered medications for this visit.   Allergies  Allergen Reactions  . Other Rash    Wool pants caused rash   . Latex     REACTION: RASH  . Lipitor [Atorvastatin]     Muscle aches at 20 mg    . Zocor [Simvastatin]     Muscle aches at 20 mg daily

## 2014-07-05 NOTE — Patient Instructions (Signed)
Continue to work on maintaining a healthy diet - increase fiber and decrease starchy foods.  Continue exercise  I will call you with your results later today or tomorrow.  If triglycerides are still elevated we will have to consider starting fenofibrate or fish oil

## 2014-07-05 NOTE — Telephone Encounter (Signed)
Informed the pt of his normal labs and lipids are still pending per Dr Meda Coffee.  Pt verbalized understanding.

## 2014-07-05 NOTE — Telephone Encounter (Signed)
Follow Up         Pt returning phone call from [facility].

## 2014-07-06 LAB — NMR LIPOPROFILE WITH LIPIDS
Cholesterol, Total: 219 mg/dL — ABNORMAL HIGH (ref 100–199)
HDL Particle Number: 30.1 umol/L — ABNORMAL LOW (ref >=30.5)
HDL Size: 8.8 nm — ABNORMAL LOW (ref >=9.2)
HDL-C: 52 mg/dL (ref >39)
LDL (calc): 153 mg/dL — ABNORMAL HIGH (ref 0–99)
LDL Particle Number: 1800 nmol/L — ABNORMAL HIGH (ref <1000)
LDL Size: 21.3 nm (ref >=20.8)
LP-IR Score: 64 — ABNORMAL HIGH (ref <=45)
Large HDL-P: 3.9 umol/L — ABNORMAL LOW (ref >=4.8)
Large VLDL-P: 2.7 nmol/L (ref <=2.7)
Small LDL Particle Number: 506 nmol/L (ref <=527)
Triglycerides: 68 mg/dL (ref 0–149)
VLDL Size: 60.2 nm — ABNORMAL HIGH (ref <=46.6)

## 2014-07-09 DIAGNOSIS — E785 Hyperlipidemia, unspecified: Secondary | ICD-10-CM | POA: Insufficient documentation

## 2014-07-09 MED ORDER — EZETIMIBE 10 MG PO TABS: 10.0000 mg | ORAL_TABLET | Freq: Every day | ORAL | Status: DC

## 2014-07-09 NOTE — Assessment & Plan Note (Signed)
Waited for labs to come thru - TG dropped significantly to 68, LDL increased to 153 with particle number at 1800.  He has been intolerant to Crestor, even at once weekly dosing.  We will start him on Zetia 10 mg once daily and repeat labs in 3 months.  Patient has recently started working with a Physiological scientist and talks about doing a cleansing diet.  Encouraged him to continue with the exercise and healthy eating as much as possible.

## 2014-08-20 ENCOUNTER — Other Ambulatory Visit: Payer: Self-pay | Admitting: Family Medicine

## 2014-08-20 ENCOUNTER — Other Ambulatory Visit: Payer: Self-pay

## 2014-08-20 NOTE — Telephone Encounter (Signed)
Rx request for Ventolin HFA 90 mcg inhal-Inhale 2 puffs into the lungs as needed 18 GM   Pharm:  [employer]

## 2014-08-20 NOTE — Telephone Encounter (Signed)
Rx request for Flovent Inhaler 44 mcg inhaler- Inhale 1 puff twice daily 10.6 gm

## 2014-08-20 NOTE — Telephone Encounter (Signed)
Both rx declined; pt has not been seen by Dr. Sherren Mocha since 12.4.2014.

## 2014-09-03 ENCOUNTER — Ambulatory Visit: Payer: BC Managed Care – PPO | Admitting: Neurology

## 2014-09-11 ENCOUNTER — Telehealth: Payer: Self-pay | Admitting: Pharmacist Clinician (PhC)/ Clinical Pharmacy Specialist

## 2014-09-11 DIAGNOSIS — E785 Hyperlipidemia, unspecified: Secondary | ICD-10-CM

## 2014-09-11 NOTE — Telephone Encounter (Signed)
LMOM for patient.  He is due to have NMR/hepatic labs in May then come to lipid clinic for follow up.

## 2014-10-02 ENCOUNTER — Other Ambulatory Visit (INDEPENDENT_AMBULATORY_CARE_PROVIDER_SITE_OTHER): Payer: BLUE CROSS/BLUE SHIELD | Admitting: *Deleted

## 2014-10-02 DIAGNOSIS — E785 Hyperlipidemia, unspecified: Secondary | ICD-10-CM | POA: Diagnosis not present

## 2014-10-02 LAB — HEPATIC FUNCTION PANEL
ALT: 18 U/L (ref 0–53)
AST: 17 U/L (ref 0–37)
Albumin: 4.1 g/dL (ref 3.5–5.2)
Alkaline Phosphatase: 67 U/L (ref 39–117)
Bilirubin, Direct: 0.1 mg/dL (ref 0.0–0.3)
Total Bilirubin: 0.7 mg/dL (ref 0.2–1.2)
Total Protein: 6.9 g/dL (ref 6.0–8.3)

## 2014-10-02 NOTE — Addendum Note (Signed)
Addended by: Eulis Foster on: 10/02/2014 08:04 AM   Modules accepted: Orders

## 2014-10-04 LAB — NMR LIPOPROFILE WITH LIPIDS
Cholesterol, Total: 166 mg/dL (ref 100–199)
HDL Particle Number: 34.1 umol/L (ref >=30.5)
HDL Size: 8.9 nm — ABNORMAL LOW (ref >=9.2)
HDL-C: 50 mg/dL (ref >39)
LDL (calc): 97 mg/dL (ref 0–99)
LDL Particle Number: 1115 nmol/L — ABNORMAL HIGH (ref <1000)
LDL Size: 21.2 nm (ref >=20.8)
LP-IR Score: 50 — ABNORMAL HIGH (ref <=45)
Large HDL-P: 5.1 umol/L (ref >=4.8)
Large VLDL-P: 3.6 nmol/L — ABNORMAL HIGH (ref <=2.7)
Small LDL Particle Number: 247 nmol/L (ref <=527)
Triglycerides: 97 mg/dL (ref 0–149)
VLDL Size: 46.6 nm (ref <=46.6)

## 2014-10-09 ENCOUNTER — Ambulatory Visit: Payer: BLUE CROSS/BLUE SHIELD | Admitting: Pharmacist

## 2014-10-11 ENCOUNTER — Ambulatory Visit (INDEPENDENT_AMBULATORY_CARE_PROVIDER_SITE_OTHER): Payer: BLUE CROSS/BLUE SHIELD | Admitting: Pharmacist

## 2014-10-11 DIAGNOSIS — E785 Hyperlipidemia, unspecified: Secondary | ICD-10-CM

## 2014-10-11 NOTE — Progress Notes (Signed)
Patient referred to lipid clinic by Dr. Meda Coffee given non-obstructive CAD and elevated CAC (39.82 - which is 80% for age and gender) on Coronary CT and needed to be on statin.  Lp(a) was normal.  Patient has tried and failed multiple statins including simvastatin 20 mg qhs, (muscle soreness/weakness after 1 month), lipitor 20 mg qd (rash and muscle aches - pt stopped then re-challenged after several weeks with same results) and Crestor 5 mg.  The Crestor was originally qd, but after muscle problems developed he held the medication for several weeks then restarted at 5 mg once weekly.  Still had to discontinue due to muscle aches.  Patient was started on zetia 10 mg tablet once daily on 07/12/2014, and is tolerating the medication with no issues.   Risk factors:  Non-obstructive CAD, CAC > 75% for age/gender - LDL goal < 70 mg/dL preferably Medications:  Zetia 10 mg tablet once daily  Intolerant:  Lipitor 20 mg qd (muscle aches); simvastatin 20 mg qd (muscle aches); Crestor 5 mg qweek (muscle aches)  Social history:  Former smoker (quit 20 years ago), drinks wine - occasional, none since New Year. Family history:  Negative for CAD, however father had valvular heart disease.  Diet: .   Eggs many mornings.  Lunch - he often skips lunch due to work schedule, so will have a snack for lunch.  Dinner - is good, typically chicken or fish and vegetables.  He and his wife cook healthy dinners at home.  Rarely eats out, never eats fast food.  Doesn't drink soda. Exercise:  Personal trainer for exercise.   Labs: 09/2014 - TC 166, TG 97, LDL 97, LDL-P 1115, HDL 50, LFTs normal  07/2014 -  TC 219, TG 68, LDL 153, LDL-P 1800, HDL 52, LFTs normal  07/2013 - TC 191, TG 436 (strange one time elevation), LDL- n/a, LDL-P 1498, HDL 39, LFTs normal (lipitor 20 mg x 1 week) 05/2013 - TC 203, TG 136, HDL 44, LDL 135, LFTs normal.  Lp(a) < 5 (normal), Glucose 92, renal function normal  - Not on lipid lowering meds - Patient  admits to having elevated TG in the past when his appetite was worse, and weight was closer to 190 lbs.  Current Outpatient Prescriptions  Medication Sig Dispense Refill  . acetaminophen (TYLENOL) 325 MG tablet Take 500 mg by mouth every 6 (six) hours as needed for mild pain.     Marland Kitchen albuterol (VENTOLIN HFA) 108 (90 BASE) MCG/ACT inhaler Inhale 2 puffs into the lungs every 6 (six) hours as needed. PATIENT NEEDS CHECK UP FOR ADDITIONAL REFILLS 18 g 0  . ezetimibe (ZETIA) 10 MG tablet Take 1 tablet (10 mg total) by mouth daily. 30 tablet 6  . fluticasone (FLOVENT HFA) 44 MCG/ACT inhaler Inhale 1 puff into the lungs 2 (two) times daily. PATIENT NEEDS CHECK UP FOR ADDITIONAL REFILLS 10.6 g 0  . HYDROcodone-acetaminophen (NORCO/VICODIN) 5-325 MG per tablet Take 1 tablet by mouth as needed.  0  . ibuprofen (ADVIL,MOTRIN) 400 MG tablet Take 400 mg by mouth every 6 (six) hours as needed for mild pain.      No current facility-administered medications for this visit.   Allergies  Allergen Reactions  . Other Rash    Wool pants caused rash   . Latex     REACTION: RASH  . Lipitor [Atorvastatin]     Muscle aches at 20 mg    . Zocor [Simvastatin]     Muscle aches at  20 mg daily    Assessment and Plan 1.  Hyperlipidemia- Patient total cholesterol lowered from 219 to 166 with zetia 10 mg 1 tablet daily and exercise.  LDL lowered from 153 to 97, but still not at goal of < 70 and LDL-P is lowered from 1800 to 1115 , but still not at goal of <1000. PCSK-9 was discussed with patient as an alternative to further lower his LDL and LDL-P.  Unfortunately, [employer] does not cover Praluent for ASCVD at this time.  He sttated that he might switch his insurance provider in near future and if so will let us know.  Until then will continue Zetia and lifestyle modifications.

## 2014-11-09 ENCOUNTER — Ambulatory Visit (INDEPENDENT_AMBULATORY_CARE_PROVIDER_SITE_OTHER): Payer: BLUE CROSS/BLUE SHIELD | Admitting: Internal Medicine

## 2014-11-09 ENCOUNTER — Encounter: Payer: Self-pay | Admitting: Internal Medicine

## 2014-11-09 VITALS — BP 110/80 | HR 74 | Temp 98.5°F | Wt 178.0 lb

## 2014-11-09 DIAGNOSIS — R1011 Right upper quadrant pain: Secondary | ICD-10-CM | POA: Diagnosis not present

## 2014-11-09 DIAGNOSIS — G4733 Obstructive sleep apnea (adult) (pediatric): Secondary | ICD-10-CM

## 2014-11-09 DIAGNOSIS — R079 Chest pain, unspecified: Secondary | ICD-10-CM | POA: Diagnosis not present

## 2014-11-09 DIAGNOSIS — J4520 Mild intermittent asthma, uncomplicated: Secondary | ICD-10-CM | POA: Diagnosis not present

## 2014-11-09 DIAGNOSIS — E785 Hyperlipidemia, unspecified: Secondary | ICD-10-CM

## 2014-11-09 DIAGNOSIS — D126 Benign neoplasm of colon, unspecified: Secondary | ICD-10-CM | POA: Diagnosis not present

## 2014-11-09 NOTE — Progress Notes (Signed)
Subjective:    Patient ID: Jake [name], male    DOB: [birth year], 54 y.o.   MRN: [id number]  HPI 54 year old patient who presents with a two-month history of intermittent right upper quadrant pain.  Symptoms are paroxysmal but often occur of following meals.  No nausea, vomiting or diarrhea.  No fever.  He did have an abdominal ultrasound performed in 2001 that was normal. He is followed by cardiology for  Nonocclusive coronary artery disease noted on CTA with an abnormal calcium score.  He has a history of statin intolerance and dyslipidemia.  Presently is on Zetia, which he tolerates well.  He has been using Zantac periodically.  His wife is also concerned about the possibility of OSA due to snoring and brief periods of apnea.  The patient does admit to occasional daytime sleepiness, but this does not seem to be pronounced  Past Medical History  Diagnosis Date  . Allergy   . Asthma   . GERD (gastroesophageal reflux disease)     History   Social History  . Marital Status: Married    Spouse Name: [name]  . Number of Children: 4  . Years of Education: BA   Occupational History  . [employer]    Social History Main Topics  . Smoking status: Former Smoker -- 0.50 packs/day for 20 years    Types: Cigarettes    Quit date: 08/19/1991  . Smokeless tobacco: Never Used  . Alcohol Use: Yes     Comment: 5-7 DRINKS WEEKLY  . Drug Use: No  . Sexual Activity: Not on file   Other Topics Concern  . Not on file   Social History Narrative   Patient is married [name]) and has four children.   Patient lives at home with his wife and children.   Patient is working  At [employer].   Patient has a BA degree.   Patient used both hands.   Patient drinks three cups of coffee daily.    Past Surgical History  Procedure Laterality Date  . Lumbar laminectomy  1997  . Neck surgery  01/2010    c5-7  . Inguinal hernia repair  2009    Family History  Problem  Relation Age of Onset  . Alcohol abuse    . Asthma    . Depression    . Valvular heart disease    . Osteoporosis Mother   . Parkinson's disease Father     Allergies  Allergen Reactions  . Other Rash    Wool pants caused rash   . Latex     REACTION: RASH  . Lipitor [Atorvastatin]     Muscle aches at 20 mg    . Zocor [Simvastatin]     Muscle aches at 20 mg daily    Current Outpatient Prescriptions on File Prior to Visit  Medication Sig Dispense Refill  . albuterol (VENTOLIN HFA) 108 (90 BASE) MCG/ACT inhaler Inhale 2 puffs into the lungs every 6 (six) hours as needed. PATIENT NEEDS CHECK UP FOR ADDITIONAL REFILLS 18 g 0  . ezetimibe (ZETIA) 10 MG tablet Take 1 tablet (10 mg total) by mouth daily. 30 tablet 6  . fluticasone (FLOVENT HFA) 44 MCG/ACT inhaler Inhale 1 puff into the lungs 2 (two) times daily. PATIENT NEEDS CHECK UP FOR ADDITIONAL REFILLS 10.6 g 0  . HYDROcodone-acetaminophen (NORCO/VICODIN) 5-325 MG per tablet Take 1 tablet by mouth as needed.  0   No current facility-administered medications on file  prior to visit.    BP 110/80 mmHg  Pulse 74  Temp(Src) 98.5 F (36.9 C) (Oral)  Wt 178 lb (80.74 kg)  SpO2 98%     Review of Systems  Constitutional: Negative for fever, chills, appetite change and fatigue.  HENT: Negative for congestion, dental problem, ear pain, hearing loss, sore throat, tinnitus, trouble swallowing and voice change.   Eyes: Negative for pain, discharge and visual disturbance.  Respiratory: Negative for cough, chest tightness, wheezing and stridor.   Cardiovascular: Negative for chest pain, palpitations and leg swelling.  Gastrointestinal: Positive for abdominal pain. Negative for nausea, vomiting, diarrhea, constipation, blood in stool and abdominal distention.  Genitourinary: Negative for urgency, hematuria, flank pain, discharge, difficulty urinating and genital sores.  Musculoskeletal: Negative for myalgias, back pain, joint swelling,  arthralgias, gait problem and neck stiffness.  Skin: Negative for rash.  Neurological: Negative for dizziness, syncope, speech difficulty, weakness, numbness and headaches.  Hematological: Negative for adenopathy. Does not bruise/bleed easily.  Psychiatric/Behavioral: Negative for behavioral problems and dysphoric mood. The patient is not nervous/anxious.        Objective:   Physical Exam  Constitutional: He is oriented to person, place, and time. He appears well-developed.  HENT:  Head: Normocephalic.  Right Ear: External ear normal.  Left Ear: External ear normal.  Low hanging soft palate with some pharyngeal crowding  Eyes: Conjunctivae and EOM are normal.  Neck: Normal range of motion.  Cardiovascular: Normal rate and normal heart sounds.   Pulmonary/Chest: Breath sounds normal.  Abdominal: Soft. Bowel sounds are normal. He exhibits no distension. There is no tenderness. There is no rebound and no guarding.  Musculoskeletal: Normal range of motion. He exhibits no edema or tenderness.  Neurological: He is alert and oriented to person, place, and time.  Psychiatric: He has a normal mood and affect. His behavior is normal.          Assessment & Plan:   Nonspecific intermittent right upper quadrant pain.  Normal clinical exam.  Options discussed including watchful waiting.  He wishes to proceed with abdominal ultrasound. Rule out OSA.  Will set up for home sleep study Mild asthma, stable Nonobstructive CAD.  Follow-up cardiology Dyslipidemia.  Continue Zetia  Follow-up PCP 6 months

## 2014-11-09 NOTE — Patient Instructions (Signed)
Fasting abdominal ultrasound as discussed  Home sleep study as scheduled  Call for any worsening or new symptoms  Follow-up PCP in 6 months or as needed

## 2014-11-09 NOTE — Progress Notes (Signed)
Pre visit review using our clinic review tool, if applicable. No additional management support is needed unless otherwise documented below in the visit note. 

## 2014-11-16 ENCOUNTER — Ambulatory Visit
Admission: RE | Admit: 2014-11-16 | Discharge: 2014-11-16 | Disposition: A | Discharge status: Home / Self Care | Payer: BLUE CROSS/BLUE SHIELD | Source: Ambulatory Visit | Attending: Internal Medicine | Admitting: Internal Medicine

## 2014-11-16 ENCOUNTER — Other Ambulatory Visit: Payer: Self-pay | Admitting: Internal Medicine

## 2014-11-16 DIAGNOSIS — R1011 Right upper quadrant pain: Secondary | ICD-10-CM

## 2014-11-19 ENCOUNTER — Other Ambulatory Visit: Payer: Self-pay | Admitting: Internal Medicine

## 2014-11-20 ENCOUNTER — Other Ambulatory Visit: Payer: Self-pay | Admitting: Internal Medicine

## 2014-11-20 DIAGNOSIS — R1011 Right upper quadrant pain: Secondary | ICD-10-CM

## 2014-11-29 ENCOUNTER — Telehealth: Payer: Self-pay | Admitting: *Deleted

## 2014-11-29 ENCOUNTER — Ambulatory Visit (HOSPITAL_COMMUNITY)
Admission: RE | Admit: 2014-11-29 | Discharge: 2014-11-29 | Disposition: A | Discharge status: Home / Self Care | Payer: BLUE CROSS/BLUE SHIELD | Source: Ambulatory Visit | Attending: Internal Medicine | Admitting: Internal Medicine

## 2014-11-29 DIAGNOSIS — R1011 Right upper quadrant pain: Secondary | ICD-10-CM | POA: Diagnosis not present

## 2014-11-29 MED ORDER — SINCALIDE 5 MCG IJ SOLR
INTRAMUSCULAR | Status: AC
  Administered 2014-11-29: 5 ug via INTRAVENOUS
  Filled 2014-11-29: qty 5

## 2014-11-29 MED ORDER — STERILE WATER FOR INJECTION IJ SOLN
INTRAMUSCULAR | Status: AC
  Administered 2014-11-29: 10 mL
  Filled 2014-11-29: qty 10

## 2014-11-29 MED ORDER — TECHNETIUM TC 99M MEBROFENIN IV KIT: 5.0000 | PACK | Freq: Once | INTRAVENOUS | Status: AC | PRN

## 2014-11-29 MED ORDER — SINCALIDE 5 MCG IJ SOLR
0.0200 ug/kg | Freq: Once | INTRAMUSCULAR | Status: AC
  Administered 2014-11-29: 5 ug via INTRAVENOUS

## 2014-11-29 NOTE — Telephone Encounter (Signed)
Dr. Raliegh Ip, pt said you were going to order sleep study. Please advise

## 2014-11-29 NOTE — Telephone Encounter (Signed)
Checked with Neoma Laming and she said pt has already been scheduled.

## 2014-11-29 NOTE — Telephone Encounter (Signed)
Please reorder home sleep study

## 2014-11-29 NOTE — Telephone Encounter (Signed)
Spoke to pt, told him appointment has already been set up with Dr.Clinton Young on Sept 13 th for sleep consult at [facility]. Pt stated yes, that is right I forgot, do you have address? Told pt no but phone number given to contact for directions. Pt verbalized understanding.

## 2014-11-30 ENCOUNTER — Ambulatory Visit (HOSPITAL_COMMUNITY): Payer: BLUE CROSS/BLUE SHIELD

## 2015-02-12 ENCOUNTER — Ambulatory Visit (INDEPENDENT_AMBULATORY_CARE_PROVIDER_SITE_OTHER): Payer: BLUE CROSS/BLUE SHIELD | Admitting: Internal Medicine

## 2015-02-12 ENCOUNTER — Encounter: Payer: Self-pay | Admitting: Internal Medicine

## 2015-02-12 VITALS — BP 118/70 | HR 66 | Ht 70.0 in | Wt 177.6 lb

## 2015-02-12 DIAGNOSIS — G4733 Obstructive sleep apnea (adult) (pediatric): Secondary | ICD-10-CM | POA: Insufficient documentation

## 2015-02-12 DIAGNOSIS — Z23 Encounter for immunization: Secondary | ICD-10-CM | POA: Diagnosis not present

## 2015-02-12 NOTE — Assessment & Plan Note (Signed)
Presumptive diagnosis based on symptoms. He is not obese but his long palate supports the diagnosis. Plan-sleep study

## 2015-02-12 NOTE — Patient Instructions (Addendum)
Order- schedule unattended home sleep study    Dx OSA  We will schedule you back for a follow-up visit to go over results, a couple of weeks after the study night.   Flu vax

## 2015-02-12 NOTE — Progress Notes (Signed)
02/12/15- 35 yoM former smoker Pt Reffered by Dr, Burnice Logan. pt states he snores and his wife says he stops breathing. pt has not had  sleep study.  Epworth Score: 9. pt has no other conerns.  Medical problems include allergic rhinitis, GERD/stricture, CAD wife tells him she thinks he has sleep apnea. Snoring worse on his back. Occasional daytime sleepiness. 3 cups of coffee in the morning. Occasional naps do help. No ENT surgery. Previous anterior repair cervical spine with plate. Lumbar laminectomy. History of asthma and allergic rhinitis. Nonobstructive CAD Quit smoking 1994.  Prior to Admission medications   Medication Sig Start Date End Date Taking? Authorizing Provider  albuterol (VENTOLIN HFA) 108 (90 BASE) MCG/ACT inhaler Inhale 2 puffs into the lungs every 6 (six) hours as needed. PATIENT NEEDS CHECK UP FOR ADDITIONAL REFILLS 08/22/14  Yes Wardell Honour, MD  ezetimibe (ZETIA) 10 MG tablet Take 1 tablet (10 mg total) by mouth daily. 07/09/14  Yes Dorothy Spark, MD  fluticasone (FLOVENT HFA) 44 MCG/ACT inhaler Inhale 1 puff into the lungs 2 (two) times daily. PATIENT NEEDS CHECK UP FOR ADDITIONAL REFILLS 08/22/14  Yes Wardell Honour, MD  HYDROcodone-acetaminophen (NORCO/VICODIN) 5-325 MG per tablet Take 1 tablet by mouth as needed. 05/09/14  Yes Historical Provider, MD  ranitidine (ZANTAC) 150 MG tablet Take 150 mg by mouth 2 (two) times daily.   Yes Historical Provider, MD   Past Medical History  Diagnosis Date  . Allergy   . Asthma   . GERD (gastroesophageal reflux disease)    Past Surgical History  Procedure Laterality Date  . Lumbar laminectomy  1997  . Neck surgery  01/2010    c5-7  . Inguinal hernia repair  2009   Family History  Problem Relation Age of Onset  . Alcohol abuse    . Asthma    . Depression    . Valvular heart disease    . Osteoporosis Mother   . Parkinson's disease Father   . Cancer Father    Social History   Social History  . Marital Status:  Married    Spouse Name: [name]  . Number of Children: 4  . Years of Education: BA   Occupational History  . [employer]    Social History Main Topics  . Smoking status: Former Smoker -- 0.50 packs/day for 20 years    Types: Cigarettes    Quit date: 08/19/1991  . Smokeless tobacco: Never Used  . Alcohol Use: 0.0 oz/week    0 Standard drinks or equivalent per week     Comment: 5-7 DRINKS WEEKLY  . Drug Use: No  . Sexual Activity: Not on file   Other Topics Concern  . Not on file   Social History Narrative   Patient is married [name]) and has four children.   Patient lives at home with his wife and children.   Patient is working  At [employer].   Patient has a BA degree.   Patient used both hands.   Patient drinks three cups of coffee daily.   ROS-see HPI   Negative unless "+" Constitutional:    weight loss, night sweats, fevers, chills, fatigue, lassitude. HEENT:    +headaches, difficulty swallowing, tooth/dental problems, sore throat,       sneezing, itching, ear ache, nasal congestion, post nasal drip, snoring CV:    chest pain, orthopnea, PND, swelling in lower extremities, anasarca,  dizziness, palpitations Resp:   shortness of breath with exertion or at rest.                productive cough,   non-productive cough, coughing up of blood.              change in color of mucus.  wheezing.   Skin:    rash or lesions. GI:  +heartburn, indigestion, abdominal pain, nausea, vomiting, diarrhea,                 change in bowel habits, loss of appetite GU: dysuria, change in color of urine, no urgency or frequency.   flank pain. MS:  + joint pain, stiffness, decreased range of motion, back pain. Neuro-     nothing unusual Psych:  change in mood or affect.  depression or anxiety.   memory loss.  OBJ- Physical Exam General- Alert, Oriented, Affect-appropriate, Distress- none acute,+ trim Skin- rash-none,  lesions- none, excoriation- none Lymphadenopathy- none Head- atraumatic            Eyes- Gross vision intact, PERRLA, conjunctivae and secretions clear            Ears- Hearing, canals-normal            Nose- Clear, no-Septal dev, mucus, polyps, erosion, perforation             Throat- Mallampati III-IV , mucosa clear , drainage- none, tonsils- atrophic Neck- flexible , trachea midline, no stridor , thyroid nl, carotid no bruit Chest - symmetrical excursion , unlabored           Heart/CV- RRR , no murmur , no gallop  , no rub, nl s1 s2                           - JVD- none , edema- none, stasis changes- none, varices- none           Lung- clear to P&A, wheeze- none, cough- none , dullness-none, rub- none           Chest wall-  Abd-  Br/ Gen/ Rectal- Not done, not indicated Extrem- cyanosis- none, clubbing, none, atrophy- none, strength- nl Neuro- grossly intact to observation

## 2015-02-15 ENCOUNTER — Telehealth: Payer: Self-pay | Admitting: Internal Medicine

## 2015-02-15 DIAGNOSIS — G4733 Obstructive sleep apnea (adult) (pediatric): Secondary | ICD-10-CM

## 2015-02-15 NOTE — Telephone Encounter (Signed)
Pt is aware of denial from Insurance for HST-he is willing to have Split night study done. Order has been placed; Libby aware. Also, note placed in order to let me know of date for split night so I can make 2 week f/u OV with CY. Nothing more needed at this time.

## 2015-03-13 ENCOUNTER — Other Ambulatory Visit: Payer: Self-pay

## 2015-03-13 MED ORDER — EZETIMIBE 10 MG PO TABS: 10.0000 mg | ORAL_TABLET | Freq: Every day | ORAL | Status: DC

## 2015-04-18 ENCOUNTER — Telehealth: Payer: Self-pay | Admitting: Family Medicine

## 2015-04-18 DIAGNOSIS — Z0000 Encounter for general adult medical examination without abnormal findings: Secondary | ICD-10-CM

## 2015-04-18 NOTE — Telephone Encounter (Signed)
Pt is calling to set up Hep C screening and to get a referral for a hand surgeon other than Dr Fredna Dow. He has seen Dr Fredna Dow and is looking for a second opinion.

## 2015-04-22 NOTE — Telephone Encounter (Signed)
Lab ordered.  Appointment made.  Per Dr Sherren Mocha patient should ask Dr Fredna Dow who to see for a second opinion. Patient is aware.

## 2015-04-23 ENCOUNTER — Other Ambulatory Visit: Payer: BLUE CROSS/BLUE SHIELD

## 2015-04-23 ENCOUNTER — Other Ambulatory Visit (INDEPENDENT_AMBULATORY_CARE_PROVIDER_SITE_OTHER): Payer: BLUE CROSS/BLUE SHIELD

## 2015-04-23 DIAGNOSIS — Z1159 Encounter for screening for other viral diseases: Secondary | ICD-10-CM

## 2015-04-24 LAB — HEPATITIS C ANTIBODY: HCV Ab: NEGATIVE

## 2015-05-06 ENCOUNTER — Ambulatory Visit (HOSPITAL_BASED_OUTPATIENT_CLINIC_OR_DEPARTMENT_OTHER): Payer: BLUE CROSS/BLUE SHIELD | Attending: Internal Medicine | Admitting: Radiology

## 2015-05-06 VITALS — Ht 71.0 in | Wt 174.0 lb

## 2015-05-06 DIAGNOSIS — R0683 Snoring: Secondary | ICD-10-CM | POA: Insufficient documentation

## 2015-05-06 DIAGNOSIS — G4733 Obstructive sleep apnea (adult) (pediatric): Secondary | ICD-10-CM | POA: Diagnosis present

## 2015-05-12 DIAGNOSIS — G4733 Obstructive sleep apnea (adult) (pediatric): Secondary | ICD-10-CM | POA: Diagnosis not present

## 2015-05-12 NOTE — Progress Notes (Signed)
  Patient Name: Jake [name], Jake [name] Date: 05/06/2015 Gender: Male D.O.B: November 13, 1960 Age (years): 71 Referring Provider: Baird Lyons MD, ABSM Height (inches): 71 Interpreting Physician: Baird Lyons MD, ABSM Weight (lbs): 174 RPSGT: Zadie Rhine BMI: 24 MRN: [id number] Neck Size: 15.50 CLINICAL INFORMATION Sleep Study Type: NPSG Indication for sleep study: OSA Epworth Sleepiness Score: SLEEP STUDY TECHNIQUE As per the AASM Manual for the Scoring of Sleep and Associated Events v2.3 (April 2016) with a hypopnea requiring 4% desaturations. The channels recorded and monitored were frontal, central and occipital EEG, electrooculogram (EOG), submentalis EMG (chin), nasal and oral airflow, thoracic and abdominal wall motion, anterior tibialis EMG, snore microphone, electrocardiogram, and pulse oximetry. MEDICATIONS Patient's medications include: charted for review Medications self-administered by patient during sleep study : No sleep medicine administered.  SLEEP ARCHITECTURE The study was initiated at 10:18:35 PM and ended at 4:45:22 AM. Sleep onset time was 9.2 minutes and the sleep efficiency was 89.7%. The total sleep time was 347.0 minutes. Stage REM latency was 70.0 minutes. The patient spent 3.03% of the night in stage N1 sleep, 66.28% in stage N2 sleep, 3.89% in stage N3 and 26.80% in REM. Alpha intrusion was absent. Supine sleep was 54.47%. Wake after sleep onset 30.6 minutes  RESPIRATORY PARAMETERS The overall apnea/hypopnea index (AHI) was 3.5 per hour. There were 4 total apneas, including 4 obstructive, 0 central and 0 mixed apneas. There were 16 hypopneas and 15 RERAs. The AHI during Stage REM sleep was 9.7 per hour. AHI while supine was 6.0 per hour. The mean oxygen saturation was 95.06%. The minimum SpO2 during sleep was 77.00%. Loud snoring was noted during this study.  CARDIAC DATA The 2 lead EKG demonstrated sinus rhythm. The mean heart rate was 64.65 beats  per minute. Other EKG findings include: None.  LEG MOVEMENT DATA The total PLMS were 0 with a resulting PLMS index of 0.00. Associated arousal with leg movement index was 0.0 .  IMPRESSIONS - No significant obstructive sleep apnea occurred during this study (AHI = 3.5/h). - No significant central sleep apnea occurred during this study (CAI = 0.0/h). - Moderate oxygen desaturation was noted during this study (Min O2 = 77.00%). - The patient snored with Loud snoring volume. - No cardiac abnormalities were noted during this study. - Clinically significant periodic limb movements did not occur during sleep. No significant associated arousals.  DIAGNOSIS - Primary Snoring (786.09 [R06.83 ICD-10]) - Normal study  RECOMMENDATIONS - Avoid alcohol, sedatives and other CNS depressants that may worsen sleep apnea and disrupt normal sleep architecture. - Sleep hygiene should be reviewed to assess factors that may improve sleep quality. - Weight management and regular exercise should be initiated or continued if appropriate.  Deneise Lever Diplomate, American Board of Sleep Medicine  ELECTRONICALLY SIGNED ON:  05/12/2015, 10:27 AM [place] PH: [phone]   FX: [phone] [place]

## 2015-06-09 ENCOUNTER — Other Ambulatory Visit: Payer: Self-pay | Admitting: Family Medicine

## 2015-06-10 ENCOUNTER — Other Ambulatory Visit: Payer: Self-pay | Admitting: Family Medicine

## 2015-06-11 ENCOUNTER — Other Ambulatory Visit: Payer: Self-pay | Admitting: Internal Medicine

## 2015-06-11 ENCOUNTER — Other Ambulatory Visit: Payer: Self-pay | Admitting: Family Medicine

## 2015-06-12 ENCOUNTER — Telehealth: Payer: Self-pay | Admitting: Cardiology

## 2015-06-12 NOTE — Telephone Encounter (Addendum)
Pt states he will be calling his PCP through [facility] to have them do the chest ct w/o contrast through [facility], for this will save him over $ 1000 dollar doing it that way.  Pt was ordered to have a chest ct done by Dr Meda Coffee, at the last [place] in 02/16, for one year out.  Initially this was scheduled for the pt to have done on 06/21/15 at [facility], but the pt states he will now have this cancelled at that location and schedule this at a [place] facility, for this will be more cost effective for him.  Pt states that once the chest ct is done and read by his PCP, then he will have them fax the results to Dr Meda Coffee for further review.  Informed the pt of our fax number.  Pt verbalized understanding, agrees with this plan, and gracious for all the assistance provided.

## 2015-06-12 NOTE — Telephone Encounter (Signed)
New Message  Pt requested to speak w/ RN to see if he can have his CT performed at another facility for a decreased price. Please call back and discuss.

## 2015-06-13 ENCOUNTER — Other Ambulatory Visit: Payer: Self-pay | Admitting: *Deleted

## 2015-06-13 MED ORDER — ALBUTEROL SULFATE HFA 108 (90 BASE) MCG/ACT IN AERS: 2.0000 | INHALATION_SPRAY | Freq: Four times a day (QID) | RESPIRATORY_TRACT | Status: DC | PRN

## 2015-06-13 MED ORDER — FLUTICASONE PROPIONATE HFA 44 MCG/ACT IN AERO: 1.0000 | INHALATION_SPRAY | Freq: Two times a day (BID) | RESPIRATORY_TRACT | Status: DC

## 2015-06-18 ENCOUNTER — Telehealth: Payer: Self-pay | Admitting: Internal Medicine

## 2015-06-18 ENCOUNTER — Telehealth: Payer: Self-pay | Admitting: Cardiology

## 2015-06-18 NOTE — Telephone Encounter (Signed)
New message      Calling to let us know that the pt has selected [facility] to have his CT.  It has been approved by [employer].  However, it cannot be scheduled until we fax the order to them.  Please fax order to [facility] at [phone] so that they can schedule appt.

## 2015-06-18 NOTE — Telephone Encounter (Signed)
Printed off the pts chest ct order from epic and faxed this order to [facility] at [phone], as requested by [facility] representative, who states the pt is approved to have this test done there at a lower cost.  Wrote in fax cover sheet for whomever receives the pts orders, to fax the results back to Dr Meda Coffee at [phone] Attention Dr Meda Coffee and Karlene Einstein.  Upon [facility] receiving this order, they will call the pt back and schedule this image.

## 2015-06-18 NOTE — Telephone Encounter (Signed)
Last ov with CY on 02/12/2015 No follow up ov scheduled at this time Patient Instructions     Order- schedule unattended home sleep study Dx OSA  We will schedule you back for a follow-up visit to go over results, a couple of weeks after the study night.   Flu vax   Called and spoke pt. Pt states he had a sleep study done on 05/06/15 and has not received results. Pt would like a message sent to CY for the results and his recs. I explained to the patient that I would send the message to [facility] and would return his call once recs have been given. Pt voiced understanding and had no further questions.   CY please advise

## 2015-06-19 ENCOUNTER — Telehealth: Payer: Self-pay | Admitting: Cardiology

## 2015-06-19 NOTE — Telephone Encounter (Signed)
Stephanie at [facility], calling to confirm if the order for the pts chest ct has been sent to [facility], so they can proceed with scheduling this image.  Informed Colletta Maryland that this order was printed off of epic and faxed to [facility] at [phone].  Colletta Maryland confirmed this information was correct and she will notify me back if there is any issue with receiving this order.

## 2015-06-19 NOTE — Telephone Encounter (Signed)
Spoke with pt. He is aware of his results. Nothing further was needed. 

## 2015-06-19 NOTE — Telephone Encounter (Signed)
The sleep study was normal except for loud snoring. He does not have obstructive sleep apnea. It may be worth seeing an ENT for evaluation of the upper airway looking for correctable causes of snoring

## 2015-06-19 NOTE — Telephone Encounter (Signed)
New message      Talk to the nurse regarding [facility].

## 2015-06-20 ENCOUNTER — Telehealth: Payer: Self-pay | Admitting: Cardiology

## 2015-06-20 NOTE — Telephone Encounter (Signed)
New message      Pt now want to have his CT at [facility] instead of [facility].  It is cheaper.  Please call Belenda Cruise at [phone] ext [phone] to give order and schedule it

## 2015-06-20 NOTE — Telephone Encounter (Signed)
Called the pt back and informed him that better quality images will be provided by our office or the hosptial for his follow-up chest ct.  Pt didn't realized this and now wants to have his chest ct ordered and scheduled back here at our office, for his original date that was scheduled for tomorrow 1/20 at 1:30 pm. Informed the pt that I will speak with Marzetta Board and Lattie Haw in [place] and have them call the pt back to reschedule him at his original date and time, or at very next available.  Pt verbalized understanding and agrees with this plan.

## 2015-06-21 ENCOUNTER — Ambulatory Visit (INDEPENDENT_AMBULATORY_CARE_PROVIDER_SITE_OTHER)
Admission: RE | Admit: 2015-06-21 | Discharge: 2015-06-21 | Disposition: A | Discharge status: Home / Self Care | Payer: BLUE CROSS/BLUE SHIELD | Source: Ambulatory Visit | Attending: Cardiology | Admitting: Cardiology

## 2015-06-21 ENCOUNTER — Inpatient Hospital Stay: Admission: RE | Admit: 2015-06-21 | Payer: BLUE CROSS/BLUE SHIELD | Source: Ambulatory Visit

## 2015-06-21 DIAGNOSIS — R911 Solitary pulmonary nodule: Secondary | ICD-10-CM

## 2015-10-11 ENCOUNTER — Other Ambulatory Visit: Payer: Self-pay | Admitting: Pharmacist

## 2015-10-11 ENCOUNTER — Telehealth: Payer: Self-pay | Admitting: *Deleted

## 2015-10-11 DIAGNOSIS — E785 Hyperlipidemia, unspecified: Secondary | ICD-10-CM

## 2015-10-11 NOTE — Telephone Encounter (Signed)
Pt walked into the office to request his lipids and whatever else to be checked, that would assess how his Zetia he's taking is working effectively or not.  Pt has been seen multiple times in [facility] by Elberta Leatherwood PharmD.  Went to [facility] about the pts request, and per Gay Filler she ordered for the pt to come in for a Cardio IQ Advanced Lipid Panel and LFTs to be drawn, then come in to see her at [facility] the week after his blood work is done. Scheduled the pt to come in on 10/14/15 to have his lab work done, to check a cardio IQ advanced lipid panel and Lfts.  Informed the pt that he does not have to be fasting for these labs. Scheduled the pt a lipid clinic appt to see Elberta Leatherwood on 10/21/15 at 2 pm, at our office.  Pt verbalized understanding, agrees with this plan, and gracious for all the assistance provided.

## 2015-10-14 ENCOUNTER — Other Ambulatory Visit (INDEPENDENT_AMBULATORY_CARE_PROVIDER_SITE_OTHER): Payer: BLUE CROSS/BLUE SHIELD | Admitting: *Deleted

## 2015-10-14 DIAGNOSIS — E785 Hyperlipidemia, unspecified: Secondary | ICD-10-CM | POA: Diagnosis not present

## 2015-10-14 LAB — HEPATIC FUNCTION PANEL
ALT: 18 U/L (ref 9–46)
AST: 17 U/L (ref 10–35)
Albumin: 4.5 g/dL (ref 3.6–5.1)
Alkaline Phosphatase: 55 U/L (ref 40–115)
Bilirubin, Direct: 0.1 mg/dL (ref <=0.2)
Indirect Bilirubin: 0.5 mg/dL (ref 0.2–1.2)
Total Bilirubin: 0.6 mg/dL (ref 0.2–1.2)
Total Protein: 6.7 g/dL (ref 6.1–8.1)

## 2015-10-18 LAB — CARDIO IQ(R) ADVANCED LIPID PANEL
Apolipoprotein B: 71 mg/dL (ref 52–109)
Cholesterol, Total: 158 mg/dL (ref 125–200)
Cholesterol/HDL Ratio: 2.5 calc (ref <=5.0)
HDL Cholesterol: 63 mg/dL (ref >=40)
LDL Large: 6682 nmol/L (ref 4334–10815)
LDL Medium: 272 nmol/L (ref 167–465)
LDL Particle Number: 1139 nmol/L (ref 1016–2185)
LDL Peak Size: 222.3 Angstrom (ref >=218.2)
LDL Small: 150 nmol/L (ref 123–441)
LDL, Calculated: 76 mg/dL
Lipoprotein (a): <10 nmol/L (ref <75)
Non-HDL Cholesterol: 95 mg/dL
Triglycerides: 93 mg/dL

## 2015-10-21 ENCOUNTER — Ambulatory Visit (INDEPENDENT_AMBULATORY_CARE_PROVIDER_SITE_OTHER): Payer: BLUE CROSS/BLUE SHIELD | Admitting: Pharmacist

## 2015-10-21 DIAGNOSIS — E785 Hyperlipidemia, unspecified: Secondary | ICD-10-CM

## 2015-10-21 DIAGNOSIS — I25119 Atherosclerotic heart disease of native coronary artery with unspecified angina pectoris: Secondary | ICD-10-CM

## 2015-10-21 NOTE — Progress Notes (Signed)
Patient ID: Jake [name]                 DOB: 1961/05/27                    MRN: [id number]     Patient referred to lipid clinic by Dr. Meda Coffee given non-obstructive CAD and elevated coronary artery calcium score of 39.82 - which is 80% for age and gender on Coronary CT.Patient has tried and failed multiple statins including simvastatin 20 mg qhs, (muscle soreness/weakness after 1 month), lipitor 20 mg qd (rash and muscle aches - pt stopped then re-challenged after several weeks with same results) and Crestor 5 mg. The Crestor was originally qd, but after muscle problems developed he held the medication for several weeks then restarted at 5 mg once weekly. Still had to discontinue due to muscle aches.Patient was started on Zetia 10 mg tablet once daily last year (07/12/2014), and is tolerating the medication with no issues.   Risk factors:CAD, coronary artery calcium score > 75% for age/gender LDL goal: < 70 mg/dL  Medications:Zetia 10 mg tablet once daily  Intolerant:Lipitor 20 mg qd (muscle aches); simvastatin 20 mg qd (muscle aches); Crestor 5 mg qweek (muscle aches)  Social history: Former smoker (quit 20 years ago), drinks wine - occasional Family history:Negative for CAD, however father had valvular heart disease.  Diet: Eggs many mornings. Juicing regularly. He and his wife cook healthy dinners at home.Rarely eats out, never eats fast food.Doesn't drink soda.  Exercise:2 days per week high intensity workouts for last 14 months. Personal trainer for exercise.   Labs: 09/2015 Cardio IQ - TC 158, TG 93, HDL 63, LDL 76, LDL-P1139, ApoB 71, Lipo (a) <10 (on Zetia 10mg  daily) 09/2014 - TC 166, TG 97, LDL 97, LDL-P 1115, HDL 50, LFTs normal  07/2014 - TC 219, TG 68, LDL 153, LDL-P 1800, HDL 52, LFTs normal  07/2013 - TC 191, TG 436 (strange one time elevation), LDL- n/a, LDL-P 1498, HDL 39, LFTs normal (lipitor 20 mg x 1 week) 05/2013 - TC 203, TG 136, HDL 44, LDL 135, LFTs  normal.  Past Medical History  Diagnosis Date  . Allergy   . Asthma   . GERD (gastroesophageal reflux disease)     Current Outpatient Prescriptions on File Prior to Visit  Medication Sig Dispense Refill  . albuterol (VENTOLIN HFA) 108 (90 Base) MCG/ACT inhaler Inhale 2 puffs into the lungs every 6 (six) hours as needed. 18 g prn  . ezetimibe (ZETIA) 10 MG tablet Take 1 tablet (10 mg total) by mouth daily. 30 tablet 6  . fluticasone (FLOVENT HFA) 44 MCG/ACT inhaler Inhale 1 puff into the lungs 2 (two) times daily. 10.6 g prn  . HYDROcodone-acetaminophen (NORCO/VICODIN) 5-325 MG per tablet Take 1 tablet by mouth as needed.  0  . ranitidine (ZANTAC) 150 MG tablet Take 150 mg by mouth 2 (two) times daily.     No current facility-administered medications on file prior to visit.    Allergies  Allergen Reactions  . Other Rash    Wool pants caused rash   . Latex     REACTION: RASH  . Lipitor [Atorvastatin]     Muscle aches at 20 mg    . Zocor [Simvastatin]     Muscle aches at 20 mg daily    Assessment/Plan:  1.) Hyperlipidemia: LDL 76 near goal of <70 mg/dL on ezetimibe monotherapy coupled with lifestyle therapy. Although near goal, pt still concerned  for the possibility of cardiac events given elevated coronary artery calcium score. Given intolerance to 3 different statins and still above LDL goal, will initiate paperwork for Repatha. Insurance coverage may be difficult given LDL is so close to goal. Will follow up with pt by phone pending insurance authorization.    Stephens November, PharmD Clinical Pharmacy Resident 3:56 PM, 10/21/2015

## 2015-10-23 ENCOUNTER — Telehealth: Payer: Self-pay | Admitting: Pharmacist

## 2015-10-23 DIAGNOSIS — I2583 Coronary atherosclerosis due to lipid rich plaque: Secondary | ICD-10-CM

## 2015-10-23 DIAGNOSIS — I25119 Atherosclerotic heart disease of native coronary artery with unspecified angina pectoris: Secondary | ICD-10-CM

## 2015-10-23 DIAGNOSIS — I2510 Atherosclerotic heart disease of native coronary artery without angina pectoris: Secondary | ICD-10-CM

## 2015-10-23 MED ORDER — EVOLOCUMAB 140 MG/ML ~~LOC~~ SOAJ: 1.0000 "pen " | SUBCUTANEOUS | Status: DC

## 2015-10-23 NOTE — Telephone Encounter (Signed)
Repatha approved by pt's insurance through 04/17/16. Rx sent to specialty pharmacy. Pt will call when he receives his first shipment so that we can set up labwork.

## 2015-10-25 ENCOUNTER — Telehealth: Payer: Self-pay | Admitting: Cardiology

## 2015-10-25 NOTE — Telephone Encounter (Signed)
Have not heard of [facility] or home injection training. We bring patients in to clinic to teach them how to use Repatha. Let message with this information for Sarah with [facility].

## 2015-10-25 NOTE — Telephone Encounter (Signed)
New Message:  Jake [name] is calling in to see if a fax was received about the orders for the patient to have in home injection training for Repatha . Please f/u with her.

## 2015-10-25 NOTE — Telephone Encounter (Signed)
Will forward to pharmacist to see if they received paperwork.

## 2015-10-27 ENCOUNTER — Other Ambulatory Visit: Payer: Self-pay | Admitting: Cardiology

## 2015-11-04 ENCOUNTER — Encounter: Payer: Self-pay | Admitting: Cardiology

## 2015-11-04 ENCOUNTER — Ambulatory Visit (INDEPENDENT_AMBULATORY_CARE_PROVIDER_SITE_OTHER): Payer: BLUE CROSS/BLUE SHIELD | Admitting: Cardiology

## 2015-11-04 VITALS — BP 134/78 | HR 67 | Ht 71.0 in | Wt 174.0 lb

## 2015-11-04 DIAGNOSIS — Z889 Allergy status to unspecified drugs, medicaments and biological substances status: Secondary | ICD-10-CM

## 2015-11-04 DIAGNOSIS — R079 Chest pain, unspecified: Secondary | ICD-10-CM

## 2015-11-04 DIAGNOSIS — E785 Hyperlipidemia, unspecified: Secondary | ICD-10-CM | POA: Diagnosis not present

## 2015-11-04 DIAGNOSIS — Z789 Other specified health status: Secondary | ICD-10-CM

## 2015-11-04 DIAGNOSIS — I2510 Atherosclerotic heart disease of native coronary artery without angina pectoris: Secondary | ICD-10-CM | POA: Diagnosis not present

## 2015-11-04 DIAGNOSIS — I2583 Coronary atherosclerosis due to lipid rich plaque: Secondary | ICD-10-CM

## 2015-11-04 DIAGNOSIS — M542 Cervicalgia: Secondary | ICD-10-CM

## 2015-11-04 NOTE — Patient Instructions (Signed)
Medication Instructions:   Your physician recommends that you continue on your current medications as directed. Please refer to the Current Medication list given to you today.   If you need a refill on your cardiac medications before your next appointment, please call your pharmacy.  Labwork:  NONE ORDER TODAY    Testing/Procedures:  NONE ORDER TODAY    Follow-Up:  Your physician wants you to follow-up in: [place] will receive a reminder letter in the mail two months in advance. If you don't receive a letter, please call our office to schedule the follow-up appointment.     Any Other Special Instructions Will Be Listed Below (If Applicable).

## 2015-11-04 NOTE — Progress Notes (Signed)
Patient ID: Jake [name], male   DOB: 08/02/1960, 55 y.o.   MRN: [id number]    Patient Name: Jake [name] Date of Encounter: 11/04/2015  Primary Care Provider:  Joycelyn Man, MD Primary Cardiologist:  Ena Dawley  Problem List   Past Medical History  Diagnosis Date  . Allergy   . Asthma   . GERD (gastroesophageal reflux disease)    Past Surgical History  Procedure Laterality Date  . Lumbar laminectomy  1997  . Neck surgery  01/2010    c5-7  . Inguinal hernia repair  2009    Allergies  Allergies  Allergen Reactions  . Other Rash    Wool pants caused rash   . Latex     REACTION: RASH  . Lipitor [Atorvastatin]     Muscle aches at 20 mg    . Zocor [Simvastatin]     Muscle aches at 20 mg daily    HPI  Jake [name] is a 55 y.o. male who in the last two weeks presented to the ER twice for episodes of chest pain tthat are retrosternal, sharp, lasting 5-10 seconds and are sometimes associated with exertion. There is no clear exacerbating or alleviating factor. It started when the patient had asthma flare up, but it has been treated and his pain continue to happen on daily basis.  The pain is associated with SOB, headaches and radiates to the neck and left arm. No relief with change of position.  He states that while driving to [place] 2 weeks ago he had a brief moment where he thought he was going to experience a syncopal episode. He is very anxious about it.  The patient denies association with food intake. He denies hypertension, hyperlipidemia, diabetes or family h/o CAD, he quit smoking 20 years ago.  Today he is coming after two weeks. He complains of necks tingling and pain as well as of intermittent pain at the bilateral temporal area. He is seeing a neurologist that is planning to perform brain MRI and transcranial Doppler.  11/27/2013 - no typical chest pain, the patient is going through a lot ofd stress, he has a dying father, who was placed at  hospice and is experiencing occasional non-exertional chest pressure. He was taken off Lipitor and simvastatin for significant muscle pain. He was restarted on Crestor 5 mg daily. He is LFTs were always normal however his CPK was not checked. He's also complaining of is a on and off pain in his left neck and left axilla pain as well as tingling in his left arm. He is a former smoker quit 21 years ago but has no cough right now.  11/04/2015 - patient is coming after one year, in the meantime he significantly increased level of exercise, and now goes to the gym several times a week with a personal trainer. He also changed his diet significantly however his cholesterol remained elevated and was recently approved for Repatha. He was intolerant to multiple statins. He denies any chest pain, shortness of breath no lower extremity edema palpitations or syncope. He overall feels great.  Home Medications  Prior to Admission medications   Medication Sig Start Date End Date Taking? Authorizing Provider  acetaminophen (TYLENOL) 325 MG tablet Take 650 mg by mouth every 6 (six) hours as needed.      Historical Provider, MD  albuterol (VENTOLIN HFA) 108 (90 BASE) MCG/ACT inhaler Inhale 2 puffs into the lungs as needed. 04/12/13   Wardell Honour, MD  beclomethasone (  BECONASE AQ) 42 MCG/SPRAY nasal spray Place 2 sprays into both nostrils 2 (two) times daily. Dose is for each nostril. 04/13/13   Dorena Cookey, MD  fluticasone (FLOVENT HFA) 44 MCG/ACT inhaler Inhale 1 puff into the lungs 2 (two) times daily. 04/12/13   Wardell Honour, MD  HYDROcodone-acetaminophen (VICODIN) 5-500 MG per tablet Take 5-500 mg by mouth as needed. 01/28/12   Historical Provider, MD  ibuprofen (ADVIL,MOTRIN) 400 MG tablet Take 400 mg by mouth every 6 (six) hours as needed.      Historical Provider, MD  loratadine (CLARITIN) 10 MG tablet Take 10 mg by mouth daily.    Historical Provider, MD  ranitidine (ZANTAC) 150 MG capsule Take 150 mg by  mouth 2 (two) times daily.      Historical Provider, MD    Family History  Family History  Problem Relation Age of Onset  . Alcohol abuse    . Asthma    . Depression    . Valvular heart disease    . Osteoporosis Mother   . Parkinson's disease Father   . Cancer Father     Social History  Social History   Social History  . Marital Status: Married    Spouse Name: [name]  . Number of Children: 4  . Years of Education: BA   Occupational History  . [employer]    Social History Main Topics  . Smoking status: Former Smoker -- 0.50 packs/day for 20 years    Types: Cigarettes    Quit date: 08/19/1991  . Smokeless tobacco: Never Used  . Alcohol Use: 0.0 oz/week    0 Standard drinks or equivalent per week     Comment: 5-7 DRINKS WEEKLY  . Drug Use: No  . Sexual Activity: Not on file   Other Topics Concern  . Not on file   Social History Narrative   Patient is married [name]) and has four children.   Patient lives at home with his wife and children.   Patient is working  At [employer].   Patient has a BA degree.   Patient used both hands.   Patient drinks three cups of coffee daily.     Review of Systems, as per HPI, otherwise negative General:  No chills, fever, night sweats or weight changes.  Cardiovascular:  No chest pain, dyspnea on exertion, edema, orthopnea, palpitations, paroxysmal nocturnal dyspnea. Dermatological: No rash, lesions/masses Respiratory: No cough, dyspnea Urologic: No hematuria, dysuria Abdominal:   No nausea, vomiting, diarrhea, bright red blood per rectum, melena, or hematemesis Neurologic:  No visual changes, wkns, changes in mental status. All other systems reviewed and are otherwise negative except as noted above.  Physical Exam  BP 129/80, HR 93 BPM General: Pleasant, NAD Psych: Normal affect. Neuro: Alert and oriented X 3. Moves all extremities spontaneously. HEENT: Normal  Neck: Supple without bruits or  JVD. Lungs:  Resp regular and unlabored, CTA. Heart: RRR no s3, s4, or murmurs. Abdomen: Soft, non-tender, non-distended, BS + x 4.  Extremities: No clubbing, cyanosis or edema. DP/PT/Radials 2+ and equal bilaterally.  Labs:  No results for input(s): CKTOTAL, CKMB, TROPONINI in the last 72 hours. Lab Results  Component Value Date   WBC 6.4 01/29/2014   HGB 14.3 01/29/2014   HCT 40.3 01/29/2014   MCV 87.0 01/29/2014   PLT 251 01/29/2014   No results for input(s): NA, K, CL, CO2, BUN, CREATININE, CALCIUM, PROT, BILITOT, ALKPHOS, ALT, AST, GLUCOSE in the  last 168 hours.  Invalid input(s): LABALBU  Lipid Panel     Component Value Date/Time   CHOL 158 10/14/2015 1045   CHOL 166 10/02/2014 0805   CHOL 203* 05/08/2013 0754   TRIG 93 10/14/2015 1045   TRIG 97 10/02/2014 0805   TRIG 136.0 05/08/2013 0754   HDL 63 10/14/2015 1045   HDL 50 10/02/2014 0805   HDL 44.40 05/08/2013 0754   CHOLHDL 2.5 10/14/2015 1045   CHOLHDL 5 05/08/2013 0754   VLDL 27.2 05/08/2013 0754   LDLCALC 76 10/14/2015 1045   LDLCALC 97 10/02/2014 0805   Accessory Clinical Findings  Echocardiogram - none  ECG - sinus bradycardia, 53 beats per minutes, normal EKG.  Coronary CT 04/26/2013  Coronary arteries:  Left main is a large vessel with minimal non-calcified plague with 0-25 % stenosis. Left main gives rise to LAD, ramus intermedium and LCx artery.  LAD is a large vessel with a mid plague in its ostial portion with associated 25-50% stenosis. There are 2 other calcified plagues in the proximal LAD with associated 25-50% stenosis. LAD only has 0-25% stenosis in its mid and distal segment. First diagonal vessel is rather small and only has mild luminal irregularities.  RI is moderate caliber vessel that further divides into two vessels and gives rise to the first septal branch. These two ramuses supply the entire anterolateral wall and have only mild luminal irregularities.  LCx is a small to  moderate caliber non-dominant vessel that gives rise to one very small obtuse marginal branch. There is mild non-calcified plague in the proximal LCx artery associated with 0-25% stenosis.  RCA is a large dominant vessel that gives rise to PDA and PLVB. RCA has a mild calcified plague that is associated with 0-25% stenosis. The rest of RCA has only luminal irregularities. PDA is a large vessel that runs all the way to the apex. There are only luminal irregularities. PLVB is a small vessel without significant stenosis.  Pericardium: Normal  IMPRESSION: 1. Coronary calcium score of 39.82. This was 16 percentile for age and sex matched control.  2. Non-obstructive CAD. Intense medical management is recommended.  Ena Dawley  EKG performed today 11/04/2015 shows sinus rhythm with first-degree AV block otherwise normal EKG and unchanged from prior.    Assessment & Plan  55 year old male with   1. Non-obstructive CAD Chest pain is now resolved., However he has an evidence of nonobstructive CAD and elevated cholesterol despite high level of exercise and diet he is encouraged to continue to do so, and also he was just approved for Repatha.  2. HLP - as above, with evidence of nonobstructive CAD and significant family history of premature coronary artery disease, the patient will be getting Repatha.   3. Chest pain - never associated with exercise now only with asthma attacks.  4. Neck and temporal pain, carotid US was normal B/L. Resolved.  Follow up in 1 year.   Ena Dawley, MD, [facility] 11/04/2015, 9:32 AM

## 2015-11-09 DIAGNOSIS — M79642 Pain in left hand: Secondary | ICD-10-CM | POA: Diagnosis not present

## 2015-11-09 DIAGNOSIS — M720 Palmar fascial fibromatosis [Dupuytren]: Secondary | ICD-10-CM | POA: Diagnosis not present

## 2015-11-14 ENCOUNTER — Encounter: Payer: Self-pay | Admitting: Gastroenterology

## 2015-11-18 ENCOUNTER — Telehealth: Payer: Self-pay | Admitting: Pharmacist

## 2015-11-18 DIAGNOSIS — E785 Hyperlipidemia, unspecified: Secondary | ICD-10-CM

## 2015-11-18 NOTE — Telephone Encounter (Signed)
Called pt to set up labwork since starting Repatha injections. Pt is due to give his second injection in 2 days. Will recheck lipid panel in late July. Pt would prefer to have another Cardio IQ lipid panel done rather than a regular lipid panel.

## 2015-11-25 ENCOUNTER — Other Ambulatory Visit: Payer: Self-pay | Admitting: Cardiology

## 2015-11-26 ENCOUNTER — Telehealth: Payer: Self-pay

## 2015-11-26 NOTE — Telephone Encounter (Signed)
Prior auth for Ezetimibe 10mg  submitted to [facility].

## 2015-12-02 ENCOUNTER — Telehealth: Payer: Self-pay | Admitting: Pharmacist

## 2015-12-02 MED ORDER — EVOLOCUMAB WITH INFUSOR 420 MG/3.5ML ~~LOC~~ SOCT: 1.0000 | SUBCUTANEOUS | Status: DC

## 2015-12-02 NOTE — Telephone Encounter (Signed)
Switching Repatha pen to infusion pump d/t latex allergy with pen. Repatha once monthly pump was approved by pt's insurance through 05/29/16. Called pt to inform him and rx [facility]. Pt will call once he receives pump so that he can give first infusion in clinic.

## 2015-12-02 NOTE — Telephone Encounter (Signed)
Pt contacted [facility], he will be receiving Repatha pump on Thursday. Will come into clinic for first infusion on Friday 7/7.

## 2015-12-02 NOTE — Telephone Encounter (Signed)
F/u  Pt calling to speak w/ Jake [name]- Please call back and discuss.

## 2015-12-24 ENCOUNTER — Encounter (INDEPENDENT_AMBULATORY_CARE_PROVIDER_SITE_OTHER): Payer: Self-pay

## 2015-12-24 ENCOUNTER — Other Ambulatory Visit (INDEPENDENT_AMBULATORY_CARE_PROVIDER_SITE_OTHER): Payer: BLUE CROSS/BLUE SHIELD | Admitting: *Deleted

## 2015-12-24 DIAGNOSIS — E785 Hyperlipidemia, unspecified: Secondary | ICD-10-CM | POA: Diagnosis not present

## 2015-12-24 LAB — HEPATIC FUNCTION PANEL
ALT: 29 U/L (ref 9–46)
AST: 24 U/L (ref 10–35)
Albumin: 4.4 g/dL (ref 3.6–5.1)
Alkaline Phosphatase: 54 U/L (ref 40–115)
Bilirubin, Direct: 0.2 mg/dL (ref <=0.2)
Indirect Bilirubin: 0.6 mg/dL (ref 0.2–1.2)
Total Bilirubin: 0.8 mg/dL (ref 0.2–1.2)
Total Protein: 6.7 g/dL (ref 6.1–8.1)

## 2015-12-27 ENCOUNTER — Encounter: Payer: Self-pay | Admitting: Gastroenterology

## 2015-12-28 LAB — CARDIO IQ(R) ADVANCED LIPID PANEL
Apolipoprotein B: <30 mg/dL — ABNORMAL LOW (ref 52–109)
Cholesterol, Total: 101 mg/dL — ABNORMAL LOW (ref 125–200)
Cholesterol/HDL Ratio: 1.5 calc (ref <=5.0)
HDL Cholesterol: 66 mg/dL (ref >=40)
LDL Large: 7600 nmol/L (ref 4334–10815)
LDL Medium: 98 nmol/L — ABNORMAL LOW (ref 167–465)
LDL Particle Number: 494 nmol/L — ABNORMAL LOW (ref 1016–2185)
LDL Peak Size: 219.9 Angstrom (ref >=218.2)
LDL Small: 72 nmol/L — ABNORMAL LOW (ref 123–441)
LDL, Calculated: 20 mg/dL
Lipoprotein (a): <10 nmol/L (ref <75)
Non-HDL Cholesterol: 35 mg/dL
Triglycerides: 77 mg/dL

## 2016-01-02 ENCOUNTER — Ambulatory Visit (AMBULATORY_SURGERY_CENTER): Payer: Self-pay | Admitting: *Deleted

## 2016-01-02 ENCOUNTER — Encounter: Payer: Self-pay | Admitting: Gastroenterology

## 2016-01-02 VITALS — Ht 70.0 in | Wt 178.2 lb

## 2016-01-02 DIAGNOSIS — Z1211 Encounter for screening for malignant neoplasm of colon: Secondary | ICD-10-CM

## 2016-01-02 MED ORDER — NA SULFATE-K SULFATE-MG SULF 17.5-3.13-1.6 GM/177ML PO SOLN: 1.0000 | Freq: Once | ORAL | 0 refills | Status: AC

## 2016-01-02 NOTE — Progress Notes (Signed)
No allergies to eggs or soy. No problems with anesthesia.  Pt declined Emmi instructions for colonoscopy  No oxygen use  No diet drug use

## 2016-01-06 ENCOUNTER — Encounter: Payer: Self-pay | Admitting: Family Medicine

## 2016-01-10 ENCOUNTER — Ambulatory Visit (AMBULATORY_SURGERY_CENTER): Payer: BLUE CROSS/BLUE SHIELD | Admitting: Gastroenterology

## 2016-01-10 ENCOUNTER — Encounter: Payer: Self-pay | Admitting: Gastroenterology

## 2016-01-10 VITALS — BP 106/67 | HR 59 | Temp 97.1°F | Resp 19 | Ht 70.0 in | Wt 178.0 lb

## 2016-01-10 DIAGNOSIS — D120 Benign neoplasm of cecum: Secondary | ICD-10-CM

## 2016-01-10 DIAGNOSIS — D125 Benign neoplasm of sigmoid colon: Secondary | ICD-10-CM | POA: Diagnosis not present

## 2016-01-10 DIAGNOSIS — Z1211 Encounter for screening for malignant neoplasm of colon: Secondary | ICD-10-CM | POA: Diagnosis present

## 2016-01-10 DIAGNOSIS — Z86010 Personal history of colonic polyps: Secondary | ICD-10-CM | POA: Diagnosis not present

## 2016-01-10 MED ORDER — SODIUM CHLORIDE 0.9 % IV SOLN: 500.0000 mL | INTRAVENOUS | Status: DC

## 2016-01-10 NOTE — Progress Notes (Signed)
Called to room to assist during endoscopic procedure.  Patient ID and intended procedure confirmed with present staff. Received instructions for my participation in the procedure from the performing physician.  

## 2016-01-10 NOTE — Patient Instructions (Signed)
YOU HAD AN ENDOSCOPIC PROCEDURE TODAY AT THE [facility]:   Refer to the procedure report that was given to you for any specific questions about what was found during the examination.  If the procedure report does not answer your questions, please call your gastroenterologist to clarify.  If you requested that your care partner not be given the details of your procedure findings, then the procedure report has been included in a sealed envelope for you to review at your convenience later.  YOU SHOULD EXPECT: Some feelings of bloating in the abdomen. Passage of more gas than usual.  Walking can help get rid of the air that was put into your GI tract during the procedure and reduce the bloating. If you had a lower endoscopy (such as a colonoscopy or flexible sigmoidoscopy) you may notice spotting of blood in your stool or on the toilet paper. If you underwent a bowel prep for your procedure, you may not have a normal bowel movement for a few days.  Please Note:  You might notice some irritation and congestion in your nose or some drainage.  This is from the oxygen used during your procedure.  There is no need for concern and it should clear up in a day or so.  SYMPTOMS TO REPORT IMMEDIATELY:   Following lower endoscopy (colonoscopy or flexible sigmoidoscopy):  Excessive amounts of blood in the stool  Significant tenderness or worsening of abdominal pains  Swelling of the abdomen that is new, acute  Fever of 100F or higher    For urgent or emergent issues, a gastroenterologist can be reached at any hour by calling [phone].   DIET: Your first meal following the procedure should be a small meal and then it is ok to progress to your normal diet. Heavy or fried foods are harder to digest and may make you feel nauseous or bloated.  Likewise, meals heavy in dairy and vegetables can increase bloating.  Drink plenty of fluids but you should avoid alcoholic beverages for 24  hours.  ACTIVITY:  You should plan to take it easy for the rest of today and you should NOT DRIVE or use heavy machinery until tomorrow (because of the sedation medicines used during the test).    FOLLOW UP: Our staff will call the number listed on your records the next business day following your procedure to check on you and address any questions or concerns that you may have regarding the information given to you following your procedure. If we do not reach you, we will leave a message.  However, if you are feeling well and you are not experiencing any problems, there is no need to return our call.  We will assume that you have returned to your regular daily activities without incident.  If any biopsies were taken you will be contacted by phone or by letter within the next 1-3 weeks.  Please call us at [phone] if you have not heard about the biopsies in 3 weeks.    SIGNATURES/CONFIDENTIALITY: You and/or your care partner have signed paperwork which will be entered into your electronic medical record.  These signatures attest to the fact that that the information above on your After Visit Summary has been reviewed and is understood.  Full responsibility of the confidentiality of this discharge information lies with you and/or your care-partner.   Information on polyps given to you today 

## 2016-01-10 NOTE — Op Note (Signed)
[place] Patient Name: Jake [name] Procedure Date: 01/10/2016 7:27 AM MRN: [id number] Endoscopist: Milus Banister , MD Age: 55 Referring MD:  Date of Birth: November 08, 1960 Gender: Male Account #: [id number] Procedure:                Colonoscopy Indications:              Screening for colorectal malignant neoplasm;                            Colonoscopy 2007 Dr. Ardis Hughs one small HP polyp Medicines:                Monitored Anesthesia Care Procedure:                Pre-Anesthesia Assessment:                           - Prior to the procedure, a History and Physical                            was performed, and patient medications and                            allergies were reviewed. The patient's tolerance of                            previous anesthesia was also reviewed. The risks                            and benefits of the procedure and the sedation                            options and risks were discussed with the patient.                            All questions were answered, and informed consent                            was obtained. Prior Anticoagulants: The patient has                            taken no previous anticoagulant or antiplatelet                            agents. ASA Grade Assessment: II - A patient with                            mild systemic disease. After reviewing the risks                            and benefits, the patient was deemed in                            satisfactory condition to undergo the procedure.  After obtaining informed consent, the colonoscope                            was passed under direct vision. Throughout the                            procedure, the patient's blood pressure, pulse, and                            oxygen saturations were monitored continuously. The                            Model CF-HQ190L [id number]) scope was introduced                            through the anus  and advanced to the the cecum,                            identified by appendiceal orifice and ileocecal                            valve. The colonoscopy was performed without                            difficulty. The patient tolerated the procedure                            well. The quality of the bowel preparation was                            excellent. The ileocecal valve, appendiceal                            orifice, and rectum were photographed. Scope In: 7:33:39 AM Scope Out: 7:43:33 AM Scope Withdrawal Time: 0 hours 8 minutes 2 seconds  Total Procedure Duration: 0 hours 9 minutes 54 seconds  Findings:                 Four sessile polyps were found in the sigmoid colon                            and cecum. The polyps were 2 to 4 mm in size. These                            polyps were removed with a cold snare. Resection                            and retrieval were complete.                           The exam was otherwise without abnormality on                            direct and retroflexion views. Complications:  No immediate complications. Estimated blood loss:                            None. Estimated Blood Loss:     Estimated blood loss: none. Impression:               - Four 2 to 4 mm polyps in the sigmoid colon and in                            the cecum, removed with a cold snare. Resected and                            retrieved.                           - The examination was otherwise normal on direct                            and retroflexion views. Recommendation:           - Patient has a contact number available for                            emergencies. The signs and symptoms of potential                            delayed complications were discussed with the                            patient. Return to normal activities tomorrow.                            Written discharge instructions were provided to the                             patient.                           - Resume previous diet.                           - Continue present medications.                           You will receive a letter within 2-3 weeks with the                            pathology results and my final recommendations.                           If the polyp(s) is proven to be 'pre-cancerous' on                            pathology, you will need repeat colonoscopy in 3-5  years. If the polyp(s) is NOT 'precancerous' on                            pathology then you should repeat colon cancer                            screening in 10 years with colonoscopy without need                            for colon cancer screening by any method prior to                            then (including stool testing). Milus Banister, MD 01/10/2016 7:46:09 AM This report has been signed electronically.

## 2016-01-10 NOTE — Progress Notes (Signed)
To recovery, report to [place], RN, VSS 

## 2016-01-13 ENCOUNTER — Telehealth: Payer: Self-pay

## 2016-01-13 NOTE — Telephone Encounter (Signed)
  Follow up Call-  Call back number 01/10/2016  Post procedure Call Back phone  # [phone]  Permission to leave phone message Yes  Some recent data might be hidden    Patient does report that he has pressure or swelling behind his left eye. Swelling is a little better since Saturday. I told the patient that it could be a result of the oxygen that is used during the procedure, however if it isn't better soon he may want to call his eye doctor and have it checked out.    Patient questions:  Do you have a fever, pain , or abdominal swelling? No. Pain Score  0 *  Have you tolerated food without any problems? Yes.    Have you been able to return to your normal activities? Yes.    Do you have any questions about your discharge instructions: Diet   No. Medications  No. Follow up visit  No.  Do you have questions or concerns about your Care? No.  Actions: * If pain score is 4 or above: No action needed, pain <4.

## 2016-01-17 ENCOUNTER — Encounter: Payer: Self-pay | Admitting: Gastroenterology

## 2016-01-28 ENCOUNTER — Ambulatory Visit (INDEPENDENT_AMBULATORY_CARE_PROVIDER_SITE_OTHER): Payer: BLUE CROSS/BLUE SHIELD | Admitting: Family Medicine

## 2016-01-28 VITALS — BP 114/78 | HR 78 | Temp 98.4°F | Resp 18 | Ht 69.75 in | Wt 174.0 lb

## 2016-01-28 DIAGNOSIS — J329 Chronic sinusitis, unspecified: Secondary | ICD-10-CM

## 2016-01-28 DIAGNOSIS — J029 Acute pharyngitis, unspecified: Secondary | ICD-10-CM | POA: Diagnosis not present

## 2016-01-28 DIAGNOSIS — R0602 Shortness of breath: Secondary | ICD-10-CM

## 2016-01-28 DIAGNOSIS — R05 Cough: Secondary | ICD-10-CM

## 2016-01-28 DIAGNOSIS — R0982 Postnasal drip: Secondary | ICD-10-CM

## 2016-01-28 DIAGNOSIS — R059 Cough, unspecified: Secondary | ICD-10-CM

## 2016-01-28 MED ORDER — ALBUTEROL SULFATE HFA 108 (90 BASE) MCG/ACT IN AERS: 2.0000 | INHALATION_SPRAY | Freq: Four times a day (QID) | RESPIRATORY_TRACT | 1 refills | Status: DC | PRN

## 2016-01-28 MED ORDER — BENZONATATE 100 MG PO CAPS: 100.0000 mg | ORAL_CAPSULE | Freq: Three times a day (TID) | ORAL | 0 refills | Status: DC | PRN

## 2016-01-28 MED ORDER — GUAIFENESIN ER 1200 MG PO TB12: 1.0000 | ORAL_TABLET | Freq: Two times a day (BID) | ORAL | 1 refills | Status: DC | PRN

## 2016-01-28 MED ORDER — FLUTICASONE PROPIONATE 50 MCG/ACT NA SUSP: 2.0000 | Freq: Every day | NASAL | 12 refills | Status: DC

## 2016-01-28 NOTE — Progress Notes (Signed)
Patient ID: Jake [name], male    DOB: 10-Nov-1960, 55 y.o.   MRN: [id number]  PCP: Joycelyn Man, MD  Chief Complaint  Patient presents with  . Cough    congestion x 2 weeks    Subjective:   HPI Presents for evaluation with sinus pressure times two weeks  55 year old male presents today with complaint of sinus pressure and some scratchy throat symptoms and a cough 2 weeks.  He reports that cough started out as dry and has now become productive. He does feel the sensation of some post nasal drip in the back of his throat. He has had to use some albuterol over the last 3 days to treat his symptoms of shortness of breath reports no wheezing. The patient reports no fever, headache, nausea, or vomiting. He denies taking any additional medication other than albuterol.  . Social History   Social History  . Marital status: Married    Spouse name: [name]  . Number of children: 4  . Years of education: BA   Occupational History  . [employer]    Social History Main Topics  . Smoking status: Former Smoker    Packs/day: 0.50    Years: 20.00    Types: Cigarettes    Quit date: 08/19/1991  . Smokeless tobacco: Never Used  . Alcohol use 6.0 oz/week    10 Standard drinks or equivalent per week  . Drug use: No  . Sexual activity: Not on file   Other Topics Concern  . Not on file   Social History Narrative   Patient is married [name]) and has four children.   Patient lives at home with his wife and children.   Patient is working  At [employer].   Patient has a BA degree.   Patient used both hands.   Patient drinks three cups of coffee daily.    . Family History  Problem Relation Age of Onset  . Osteoporosis Mother   . Parkinson's disease Father   . Cancer Father   . Alcohol abuse    . Asthma    . Depression    . Valvular heart disease    . Colon cancer Paternal Grandmother 104    Review of Systems  Constitutional: Positive for fatigue.   HENT: Positive for congestion, postnasal drip and sore throat.        SEE HPI  Respiratory: Positive for cough and shortness of breath.   Cardiovascular: Negative.    Patient Active Problem List   Diagnosis Date Noted  . Obstructive sleep apnea 02/12/2015  . Hyperlipidemia 07/09/2014  . CAD (coronary artery disease) 07/04/2014  . Other and unspecified hyperlipidemia 05/16/2013  . External hemorrhoid 05/04/2013  . Chest pain at rest 05/03/2013  . Neck pain 05/03/2013  . Dizziness 05/03/2013  . Atherosclerotic coronary vascular disease 05/03/2013  . Chest pain 04/24/2013  . Prostatitis 04/05/2012  . Asthma 10/03/2007  . GERD 10/03/2007  . VENTRAL HERNIA, SMALL 10/03/2007  . Allergic rhinitis, cause unspecified 03/01/2007  . ESOPHAGEAL STRICTURE 03/01/2007  . POLYP, COLON 10/27/2005  . SCHATZKI'S RING, HX OF 10/27/2005     Prior to Admission medications   Medication Sig Start Date End Date Taking? Authorizing Provider  albuterol (VENTOLIN HFA) 108 (90 Base) MCG/ACT inhaler Inhale 2 puffs into the lungs every 6 (six) hours as needed. 06/13/15  Yes Deneise Lever, MD  Evolocumab with Infusor (Taylor) 420 MG/3.5ML SOCT Inject 1 cartridge into the  skin every 30 (thirty) days. 12/02/15  Yes Dorothy Spark, MD  fluticasone (FLOVENT HFA) 44 MCG/ACT inhaler Inhale 1 puff into the lungs 2 (two) times daily. 06/13/15  Yes Deneise Lever, MD  HYDROcodone-acetaminophen (NORCO/VICODIN) 5-325 MG per tablet Take 1 tablet by mouth as needed. 05/09/14  Yes Historical Provider, MD  ibuprofen (ADVIL,MOTRIN) 600 MG tablet Take 600 mg by mouth every 6 (six) hours as needed for moderate pain.   Yes Historical Provider, MD  ZETIA 10 MG tablet TAKE 1 TABLET ONCE DAILY. 11/26/15  Yes Dorothy Spark, MD     Allergies  Allergen Reactions  . Other Rash    Wool pants caused rash   . Latex     REACTION: RASH  . Lipitor [Atorvastatin]     Muscle aches at 20 mg    . Zocor  [Simvastatin]     Muscle aches at 20 mg daily       Objective:  Physical Exam  Constitutional: He is oriented to person, place, and time. He appears well-developed and well-nourished.  HENT:  Head: Normocephalic.  Right Ear: External ear normal.  Left Ear: External ear normal.  Nose: Nose normal.  Mouth/Throat: Oropharynx is clear and moist.  Eyes: Pupils are equal, round, and reactive to light.  Cardiovascular: Normal rate, regular rhythm and normal heart sounds.   Pulmonary/Chest: Effort normal and breath sounds normal.  Musculoskeletal: Normal range of motion.  Neurological: He is alert and oriented to person, place, and time.  Skin: Skin is warm and dry.    Vitals:   01/28/16 1529  BP: 114/78  Pulse: 78  Resp: 18  Temp: 98.4 F (36.9 C)    Assessment & Plan:  1. Cough 2. Shortness of breath 3. Post-nasal drainage 4. Sore throat  Physical exam was unremarkable.  Symptoms are likely viral.  Treating symptomatically.  Marland Kitchen albuterol (PROVENTIL HFA;VENTOLIN HFA) 108 (90 Base) MCG/ACT inhaler    Sig: Inhale 2 puffs into the lungs every 6 (six) hours as needed for wheezing or shortness of breath (cough, shortness of breath or wheezing.).  Marland Kitchen Guaifenesin (MUCINEX MAXIMUM STRENGTH) 1200 MG TB12    Sig: Take 1 tablet (1,200 mg total) by mouth every 12 (twelve) hours as needed.    Dispense:  14 tablet  . benzonatate (TESSALON) 100 MG capsule    Sig: Take 1-2 capsules (100-200 mg total) by mouth 3 (three) times daily as needed for cough.  . fluticasone (FLONASE) 50 MCG/ACT nasal spray    Sig: Place 2 sprays into both nostrils daily.    Follow if symptoms worsen or do not improve.  Carroll Sage. Kenton Kingfisher, MSN, FNP-C [facility]

## 2016-01-28 NOTE — Patient Instructions (Addendum)
Treating symptomatically for upper respiratory illness. Please take the following medications as needed to treat symptoms. If condition worsens, please return for care.  Marland Kitchen albuterol (PROVENTIL HFA;VENTOLIN HFA) 108 (90 Base) MCG/ACT inhaler    Sig: Inhale 2 puffs into the lungs every 6 (six) hours as needed for wheezing or shortness of breath (cough, shortness of breath or wheezing.).  Marland Kitchen Guaifenesin (MUCINEX MAXIMUM STRENGTH) 1200 MG TB12    Sig: Take 1 tablet (1,200 mg total) by mouth every 12 (twelve) hours as needed.  . benzonatate (TESSALON) 100 MG capsule    Sig: Take 1-2 capsules (100-200 mg total) by mouth 3 (three) times daily as needed for cough.  . fluticasone (FLONASE) 50 MCG/ACT nasal spray    Sig: Place 2 sprays into both nostrils daily.   Carroll Sage. Kenton Kingfisher, MSN, FNP-C [facility]   IF you received an x-ray today, you will receive an invoice from [facility]. Please contact [facility] at [phone] with questions or concerns regarding your invoice.   IF you received labwork today, you will receive an invoice from [employer]. Please contact [facility] at [phone] with questions or concerns regarding your invoice.   Our billing staff will not be able to assist you with questions regarding bills from these companies.  You will be contacted with the lab results as soon as they are available. The fastest way to get your results is to activate your My Chart account. Instructions are located on the last page of this paperwork. If you have not heard from [place] regarding the results in 2 weeks, please contact this office.      Upper Respiratory Infection, Adult Most upper respiratory infections (URIs) are a viral infection of the air passages leading to the lungs. A URI affects the nose, throat, and upper air passages. The most common type of URI is nasopharyngitis and is typically referred to  as "the common cold." URIs run their course and usually go away on their own. Most of the time, a URI does not require medical attention, but sometimes a bacterial infection in the upper airways can follow a viral infection. This is called a secondary infection. Sinus and middle ear infections are common types of secondary upper respiratory infections. Bacterial pneumonia can also complicate a URI. A URI can worsen asthma and chronic obstructive pulmonary disease (COPD). Sometimes, these complications can require emergency medical care and may be life threatening.  CAUSES Almost all URIs are caused by viruses. A virus is a type of germ and can spread from one person to another.  RISKS FACTORS You may be at risk for a URI if:   You smoke.   You have chronic heart or lung disease.  You have a weakened defense (immune) system.   You are very young or very old.   You have nasal allergies or asthma.  You work in crowded or poorly ventilated areas.  You work in health care facilities or schools. SIGNS AND SYMPTOMS  Symptoms typically develop 2-3 days after you come in contact with a cold virus. Most viral URIs last 7-10 days. However, viral URIs from the influenza virus (flu virus) can last 14-18 days and are typically more severe. Symptoms may include:   Runny or stuffy (congested) nose.   Sneezing.   Cough.   Sore throat.   Headache.   Fatigue.   Fever.   Loss of appetite.   Pain in your forehead, behind your  eyes, and over your cheekbones (sinus pain).  Muscle aches.  DIAGNOSIS  Your health care provider may diagnose a URI by:  Physical exam.  Tests to check that your symptoms are not due to another condition such as:  Strep throat.  Sinusitis.  Pneumonia.  Asthma. TREATMENT  A URI goes away on its own with time. It cannot be cured with medicines, but medicines may be prescribed or recommended to relieve symptoms. Medicines may help:  Reduce your  fever.  Reduce your cough.  Relieve nasal congestion. HOME CARE INSTRUCTIONS   Take medicines only as directed by your health care provider.   Gargle warm saltwater or take cough drops to comfort your throat as directed by your health care provider.  Use a warm mist humidifier or inhale steam from a shower to increase air moisture. This may make it easier to breathe.  Drink enough fluid to keep your urine clear or pale yellow.   Eat soups and other clear broths and maintain good nutrition.   Rest as needed.   Return to work when your temperature has returned to normal or as your health care provider advises. You may need to stay home longer to avoid infecting others. You can also use a face mask and careful hand washing to prevent spread of the virus.  Increase the usage of your inhaler if you have asthma.   Do not use any tobacco products, including cigarettes, chewing tobacco, or electronic cigarettes. If you need help quitting, ask your health care provider. PREVENTION  The best way to protect yourself from getting a cold is to practice good hygiene.   Avoid oral or hand contact with people with cold symptoms.   Wash your hands often if contact occurs.  There is no clear evidence that vitamin C, vitamin E, echinacea, or exercise reduces the chance of developing a cold. However, it is always recommended to get plenty of rest, exercise, and practice good nutrition.  SEEK MEDICAL CARE IF:   You are getting worse rather than better.   Your symptoms are not controlled by medicine.   You have chills.  You have worsening shortness of breath.  You have brown or red mucus.  You have yellow or brown nasal discharge.  You have pain in your face, especially when you bend forward.  You have a fever.  You have swollen neck glands.  You have pain while swallowing.  You have white areas in the back of your throat. SEEK IMMEDIATE MEDICAL CARE IF:   You have severe  or persistent:  Headache.  Ear pain.  Sinus pain.  Chest pain.  You have chronic lung disease and any of the following:  Wheezing.  Prolonged cough.  Coughing up blood.  A change in your usual mucus.  You have a stiff neck.  You have changes in your:  Vision.  Hearing.  Thinking.  Mood. MAKE SURE YOU:   Understand these instructions.  Will watch your condition.  Will get help right away if you are not doing well or get worse.   This information is not intended to replace advice given to you by your health care provider. Make sure you discuss any questions you have with your health care provider.   Document Released: 11/11/2000 Document Revised: 10/02/2014 Document Reviewed: 08/23/2013 Elsevier Interactive Patient Education [employer].

## 2016-01-30 DIAGNOSIS — H00025 Hordeolum internum left lower eyelid: Secondary | ICD-10-CM | POA: Diagnosis not present

## 2016-02-20 ENCOUNTER — Ambulatory Visit (INDEPENDENT_AMBULATORY_CARE_PROVIDER_SITE_OTHER): Payer: BLUE CROSS/BLUE SHIELD | Admitting: Family Medicine

## 2016-02-20 ENCOUNTER — Encounter: Payer: Self-pay | Admitting: Family Medicine

## 2016-02-20 VITALS — BP 122/70 | HR 92 | Temp 98.1°F | Wt 179.1 lb

## 2016-02-20 DIAGNOSIS — R05 Cough: Secondary | ICD-10-CM

## 2016-02-20 DIAGNOSIS — J324 Chronic pansinusitis: Secondary | ICD-10-CM | POA: Diagnosis not present

## 2016-02-20 DIAGNOSIS — M47816 Spondylosis without myelopathy or radiculopathy, lumbar region: Secondary | ICD-10-CM | POA: Diagnosis not present

## 2016-02-20 DIAGNOSIS — R059 Cough, unspecified: Secondary | ICD-10-CM

## 2016-02-20 MED ORDER — HYDROCODONE-HOMATROPINE 5-1.5 MG/5ML PO SYRP: 5.0000 mL | ORAL_SOLUTION | Freq: Three times a day (TID) | ORAL | 0 refills | Status: DC | PRN

## 2016-02-20 MED ORDER — PREDNISONE 10 MG PO TABS: ORAL_TABLET | ORAL | 0 refills | Status: DC

## 2016-02-20 MED ORDER — AMOXICILLIN-POT CLAVULANATE 875-125 MG PO TABS: 1.0000 | ORAL_TABLET | Freq: Two times a day (BID) | ORAL | 0 refills | Status: DC

## 2016-02-20 MED ORDER — BENZONATATE 100 MG PO CAPS: 100.0000 mg | ORAL_CAPSULE | Freq: Three times a day (TID) | ORAL | 0 refills | Status: DC

## 2016-02-20 NOTE — Progress Notes (Signed)
Subjective:    Patient ID: Jake [name], male    DOB: Oct 10, 1960, 55 y.o.   MRN: [id number]  HPI  Jake [name] is a 55 year old male who presents today with cough that has been present for 3 weeks that is productive with yellow sputum and wakes him at night. Associated symptoms of sinus pressure/pain which has been increasing in severity, ear pressure, post nasal drip, and wheezing have been present. Denies fever, chills, sweats, tooth pain, sore throat, or symptoms of reflux. Currently resuming use of  flovent for the past 2 weeks and reports beginning to use his albuterol inhaler more often over the past 2 weeks and up to BID "at times" which has provided excellent benefit.  OTC mucinex and sudafed have provided limited benefit. History of asthma that has been well controlled until this recent episode noted above and he reports discontinuing his daily control medication until 2 weeks ago as noted. He further plans to follow up with his pulmonologist as recommended. He has a history of reflux but denies reflux symptoms at this time. He reports this is well controlled with dietary changes.   Review of Systems  Constitutional: Negative for chills, fatigue and fever.  HENT: Positive for congestion, ear pain, postnasal drip, rhinorrhea and sinus pressure. Negative for sore throat.   Respiratory: Positive for cough and wheezing.   Cardiovascular: Negative for chest pain and palpitations.  Gastrointestinal: Negative for abdominal pain, diarrhea, nausea and vomiting.  Musculoskeletal: Negative for myalgias.  Skin: Negative for rash.  Neurological: Negative for dizziness and headaches.   Past Medical History:  Diagnosis Date  . Allergy   . Arthritis   . Asthma   . GERD (gastroesophageal reflux disease)   . Hyperlipidemia   . Sleep apnea    pt denies     Social History   Social History  . Marital status: Married    Spouse name: [name]  . Number of children: 4  . Years of  education: BA   Occupational History  . [employer]    Social History Main Topics  . Smoking status: Former Smoker    Packs/day: 0.50    Years: 20.00    Types: Cigarettes    Quit date: 08/19/1991  . Smokeless tobacco: Never Used  . Alcohol use 6.0 oz/week    10 Standard drinks or equivalent per week  . Drug use: No  . Sexual activity: Not on file   Other Topics Concern  . Not on file   Social History Narrative   Patient is married [name]) and has four children.   Patient lives at home with his wife and children.   Patient is working  At [employer].   Patient has a BA degree.   Patient used both hands.   Patient drinks three cups of coffee daily.    Past Surgical History:  Procedure Laterality Date  . INGUINAL HERNIA REPAIR  2009  . LUMBAR LAMINECTOMY  1997  . NECK SURGERY  01/2010   c5-7  . VASECTOMY      Family History  Problem Relation Age of Onset  . Osteoporosis Mother   . Parkinson's disease Father   . Cancer Father   . Alcohol abuse    . Asthma    . Depression    . Valvular heart disease    . Colon cancer Paternal Grandmother 54    Allergies  Allergen Reactions  . Other Rash    Wool  pants caused rash   . Latex     REACTION: RASH  . Lipitor [Atorvastatin]     Muscle aches at 20 mg    . Zocor [Simvastatin]     Muscle aches at 20 mg daily    Current Outpatient Prescriptions on File Prior to Visit  Medication Sig Dispense Refill  . albuterol (PROVENTIL HFA;VENTOLIN HFA) 108 (90 Base) MCG/ACT inhaler Inhale 2 puffs into the lungs every 6 (six) hours as needed for wheezing or shortness of breath (cough, shortness of breath or wheezing.). 1 Inhaler 1  . Evolocumab with Infusor ([place]) 420 MG/3.5ML SOCT Inject 1 cartridge into the skin every 30 (thirty) days. 1 cartridge 11  . fluticasone (FLONASE) 50 MCG/ACT nasal spray Place 2 sprays into both nostrils daily. 16 g 12  . fluticasone (FLOVENT HFA) 44 MCG/ACT  inhaler Inhale 1 puff into the lungs 2 (two) times daily. 10.6 g prn  . HYDROcodone-acetaminophen (NORCO/VICODIN) 5-325 MG per tablet Take 1 tablet by mouth as needed.  0  . ibuprofen (ADVIL,MOTRIN) 600 MG tablet Take 600 mg by mouth every 6 (six) hours as needed for moderate pain.    Marland Kitchen ZETIA 10 MG tablet TAKE 1 TABLET ONCE DAILY. 30 tablet 11  . Guaifenesin (MUCINEX MAXIMUM STRENGTH) 1200 MG TB12 Take 1 tablet (1,200 mg total) by mouth every 12 (twelve) hours as needed. (Patient not taking: Reported on 02/20/2016) 14 tablet 1   Current Facility-Administered Medications on File Prior to Visit  Medication Dose Route Frequency Provider Last Rate Last Dose  . 0.9 %  sodium chloride infusion  500 mL Intravenous Continuous Milus Banister, MD        BP 122/70 (BP Location: Right Arm, Patient Position: Sitting, Cuff Size: Normal)   Pulse 92   Temp 98.1 F (36.7 C) (Oral)   Wt 179 lb 1.6 oz (81.2 kg)   SpO2 97%   BMI 25.88 kg/m        Objective:   Physical Exam  Constitutional: He is oriented to person, place, and time. He appears well-developed and well-nourished.  HENT:  Nose: Rhinorrhea present. Right sinus exhibits maxillary sinus tenderness and frontal sinus tenderness. Left sinus exhibits maxillary sinus tenderness and frontal sinus tenderness.  Mouth/Throat: Mucous membranes are normal. No oropharyngeal exudate or posterior oropharyngeal erythema.  TMs dull bilaterally  Eyes: Pupils are equal, round, and reactive to light. No scleral icterus.  Neck: Neck supple.  Cardiovascular: Normal rate and regular rhythm.   Pulmonary/Chest: Effort normal and breath sounds normal. He has no wheezes. He has no rales.  Abdominal: Soft. Bowel sounds are normal. There is no tenderness.  Lymphadenopathy:    He has no cervical adenopathy.  Neurological: He is alert and oriented to person, place, and time. Coordination normal.  Skin: Skin is warm and dry. No rash noted.  Psychiatric: He has a  normal mood and affect. His behavior is normal. Judgment and thought content normal.       Assessment & Plan:  1. Pansinusitis, unspecified chronicity Symptom duration with increasing sinus pressure supports treatment for sinusitis. We discussed the benefits and adverse effects of antibiotic therapy and decided to proceed with treatment as symptoms are not improving with supportive therapies. - amoxicillin-clavulanate (AUGMENTIN) 875-125 MG tablet; Take 1 tablet by mouth 2 (two) times daily.  Dispense: 20 tablet; Refill: 0 - predniSONE (DELTASONE) 10 MG tablet; Take 4 tablets once daily for 2 days, 3 tabs daily for 2 days, 2 tabs daily for  2 days, 1 tab daily for 2 days.  Dispense: 20 tablet; Refill: 0  2. Cough No wheezing noted upon exam. History of asthma and patient report of wheezing are present. We discussed the option of a prednisone taper due to recent symptoms and recent use of albuterol inhaler. Advised patient to return for further evaluation if symptoms do not improve, worsen, or he is using his rescue inhaler >2 times/week as this is indicating that his asthma is not well controlled. He voiced understanding and agreed with the plan.  - HYDROcodone-homatropine (HYCODAN) 5-1.5 MG/5ML syrup; Take 5 mLs by mouth every 8 (eight) hours as needed for cough.  Dispense: 120 mL; Refill: 0 - benzonatate (TESSALON) 100 MG capsule; Take 1 capsule (100 mg total) by mouth 3 (three) times daily.  Dispense: 20 capsule; Refill: 0 - predniSONE (DELTASONE) 10 MG tablet; Take 4 tablets once daily for 2 days, 3 tabs daily for 2 days, 2 tabs daily for 2 days, 1 tab daily for 2 days.  Dispense: 20 tablet; Refill: 0  Delano Metz, FNP-C

## 2016-02-20 NOTE — Patient Instructions (Signed)
It was a pleasure to meet you today! Please take medications as directed and follow up if symptoms do not improve in 3 to 4 days, worsen, or you develop new symptoms. Also, very important to follow up if you are using your rescue inhaler more than 2 times/ week.   Sinusitis, Adult Sinusitis is redness, soreness, and inflammation of the paranasal sinuses. Paranasal sinuses are air pockets within the bones of your face. They are located beneath your eyes, in the middle of your forehead, and above your eyes. In healthy paranasal sinuses, mucus is able to drain out, and air is able to circulate through them by way of your nose. However, when your paranasal sinuses are inflamed, mucus and air can become trapped. This can allow bacteria and other germs to grow and cause infection. Sinusitis can develop quickly and last only a short time (acute) or continue over a long period (chronic). Sinusitis that lasts for more than 12 weeks is considered chronic. CAUSES Causes of sinusitis include:  Allergies.  Structural abnormalities, such as displacement of the cartilage that separates your nostrils (deviated septum), which can decrease the air flow through your nose and sinuses and affect sinus drainage.  Functional abnormalities, such as when the small hairs (cilia) that line your sinuses and help remove mucus do not work properly or are not present. SIGNS AND SYMPTOMS Symptoms of acute and chronic sinusitis are the same. The primary symptoms are pain and pressure around the affected sinuses. Other symptoms include:  Upper toothache.  Earache.  Headache.  Bad breath.  Decreased sense of smell and taste.  A cough, which worsens when you are lying flat.  Fatigue.  Fever.  Thick drainage from your nose, which often is green and may contain pus (purulent).  Swelling and warmth over the affected sinuses. DIAGNOSIS Your health care provider will perform a physical exam. During your exam, your  health care provider may perform any of the following to help determine if you have acute sinusitis or chronic sinusitis:  Look in your nose for signs of abnormal growths in your nostrils (nasal polyps).  Tap over the affected sinus to check for signs of infection.  View the inside of your sinuses using an imaging device that has a light attached (endoscope). If your health care provider suspects that you have chronic sinusitis, one or more of the following tests may be recommended:  Allergy tests.  Nasal culture. A sample of mucus is taken from your nose, sent to a lab, and screened for bacteria.  Nasal cytology. A sample of mucus is taken from your nose and examined by your health care provider to determine if your sinusitis is related to an allergy. TREATMENT Most cases of acute sinusitis are related to a viral infection and will resolve on their own within 10 days. Sometimes, medicines are prescribed to help relieve symptoms of both acute and chronic sinusitis. These may include pain medicines, decongestants, nasal steroid sprays, or saline sprays. However, for sinusitis related to a bacterial infection, your health care provider will prescribe antibiotic medicines. These are medicines that will help kill the bacteria causing the infection. Rarely, sinusitis is caused by a fungal infection. In these cases, your health care provider will prescribe antifungal medicine. For some cases of chronic sinusitis, surgery is needed. Generally, these are cases in which sinusitis recurs more than 3 times per year, despite other treatments. HOME CARE INSTRUCTIONS  Drink plenty of water. Water helps thin the mucus so your sinuses can  drain more easily.  Use a humidifier.  Inhale steam 3-4 times a day (for example, sit in the bathroom with the shower running).  Apply a warm, moist washcloth to your face 3-4 times a day, or as directed by your health care provider.  Use saline nasal sprays to help  moisten and clean your sinuses.  Take medicines only as directed by your health care provider.  If you were prescribed either an antibiotic or antifungal medicine, finish it all even if you start to feel better. SEEK IMMEDIATE MEDICAL CARE IF:  You have increasing pain or severe headaches.  You have nausea, vomiting, or drowsiness.  You have swelling around your face.  You have vision problems.  You have a stiff neck.  You have difficulty breathing.   This information is not intended to replace advice given to you by your health care provider. Make sure you discuss any questions you have with your health care provider.   Document Released: 05/18/2005 Document Revised: 06/08/2014 Document Reviewed: 06/02/2011 Elsevier Interactive Patient Education [employer].

## 2016-02-20 NOTE — Progress Notes (Signed)
Pre visit review using our clinic review tool, if applicable. No additional management support is needed unless otherwise documented below in the visit note. 

## 2016-02-26 DIAGNOSIS — N50811 Right testicular pain: Secondary | ICD-10-CM | POA: Diagnosis not present

## 2016-02-26 DIAGNOSIS — N50812 Left testicular pain: Secondary | ICD-10-CM | POA: Diagnosis not present

## 2016-02-26 DIAGNOSIS — N509 Disorder of male genital organs, unspecified: Secondary | ICD-10-CM | POA: Diagnosis not present

## 2016-02-26 DIAGNOSIS — K4090 Unilateral inguinal hernia, without obstruction or gangrene, not specified as recurrent: Secondary | ICD-10-CM | POA: Diagnosis not present

## 2016-03-10 ENCOUNTER — Ambulatory Visit (INDEPENDENT_AMBULATORY_CARE_PROVIDER_SITE_OTHER): Payer: BLUE CROSS/BLUE SHIELD | Admitting: Emergency Medicine

## 2016-03-10 DIAGNOSIS — Z2911 Encounter for prophylactic immunotherapy for respiratory syncytial virus (RSV): Secondary | ICD-10-CM | POA: Diagnosis not present

## 2016-03-10 DIAGNOSIS — Z23 Encounter for immunization: Secondary | ICD-10-CM

## 2016-06-24 ENCOUNTER — Telehealth: Payer: Self-pay | Admitting: Pharmacist

## 2016-06-24 NOTE — Telephone Encounter (Signed)
Repatha prior auth approved 06/16/16->06/15/17 Auth code [id number].   LM to make aware

## 2016-07-23 DIAGNOSIS — H21512 Anterior synechiae (iris), left eye: Secondary | ICD-10-CM | POA: Diagnosis not present

## 2016-07-29 DIAGNOSIS — Z6826 Body mass index (BMI) 26.0-26.9, adult: Secondary | ICD-10-CM | POA: Diagnosis not present

## 2016-07-29 DIAGNOSIS — M4692 Unspecified inflammatory spondylopathy, cervical region: Secondary | ICD-10-CM | POA: Diagnosis not present

## 2016-08-03 ENCOUNTER — Ambulatory Visit (INDEPENDENT_AMBULATORY_CARE_PROVIDER_SITE_OTHER): Payer: BLUE CROSS/BLUE SHIELD | Admitting: Family Medicine

## 2016-08-03 ENCOUNTER — Encounter: Payer: Self-pay | Admitting: Family Medicine

## 2016-08-03 VITALS — BP 110/82 | HR 76 | Temp 98.0°F | Wt 180.0 lb

## 2016-08-03 DIAGNOSIS — R059 Cough, unspecified: Secondary | ICD-10-CM

## 2016-08-03 DIAGNOSIS — R52 Pain, unspecified: Secondary | ICD-10-CM

## 2016-08-03 DIAGNOSIS — J029 Acute pharyngitis, unspecified: Secondary | ICD-10-CM | POA: Diagnosis not present

## 2016-08-03 DIAGNOSIS — J22 Unspecified acute lower respiratory infection: Secondary | ICD-10-CM

## 2016-08-03 DIAGNOSIS — R05 Cough: Secondary | ICD-10-CM | POA: Diagnosis not present

## 2016-08-03 LAB — POCT INFLUENZA A/B
Influenza A, POC: NEGATIVE
Influenza B, POC: NEGATIVE

## 2016-08-03 LAB — POCT RAPID STREP A (OFFICE): Rapid Strep A Screen: NEGATIVE

## 2016-08-03 MED ORDER — BENZONATATE 100 MG PO CAPS: 100.0000 mg | ORAL_CAPSULE | Freq: Three times a day (TID) | ORAL | 0 refills | Status: DC

## 2016-08-03 MED ORDER — AZITHROMYCIN 250 MG PO TABS: ORAL_TABLET | ORAL | 0 refills | Status: DC

## 2016-08-03 NOTE — Addendum Note (Signed)
Addended by: Wyvonne Lenz on: 08/03/2016 05:06 PM   Modules accepted: Orders

## 2016-08-03 NOTE — Progress Notes (Signed)
Pre visit review using our clinic review tool, if applicable. No additional management support is needed unless otherwise documented below in the visit note. 

## 2016-08-03 NOTE — Progress Notes (Signed)
Patient ID: Jake [name], male   DOB: [birth year], 56 y.o.   MRN: [id number]  PCP: Joycelyn Man, MD  Subjective:  Jake [name] is a 56 y.o. year old very pleasant male patient who presents with Upper Respiratory infection symptoms including nasal congestion, sinus pressure/pain, chills, sweats, sore throat, and productive cough with yellow sputum, and left ear pain. -started:4 days ago symptoms are improving -previous treatments: OTC Zicam and Alka Seltzer Day has provided moderate benefit. -sick contacts/travel/risks: denies flu exposure.  -Hx of: asthma use of ; no use of his rescue inhaler  ROS-denies fever, SOB, NVD, tooth pain  Pertinent Past Medical History- CAD, asthma  Medications- reviewed  Current Outpatient Prescriptions  Medication Sig Dispense Refill  . albuterol (PROVENTIL HFA;VENTOLIN HFA) 108 (90 Base) MCG/ACT inhaler Inhale 2 puffs into the lungs every 6 (six) hours as needed for wheezing or shortness of breath (cough, shortness of breath or wheezing.). 1 Inhaler 1  . Evolocumab with Infusor ([place]) 420 MG/3.5ML SOCT Inject 1 cartridge into the skin every 30 (thirty) days. 1 cartridge 11  . fluticasone (FLONASE) 50 MCG/ACT nasal spray Place 2 sprays into both nostrils daily. 16 g 12  . fluticasone (FLOVENT HFA) 44 MCG/ACT inhaler Inhale 1 puff into the lungs 2 (two) times daily. 10.6 g prn  . HYDROcodone-acetaminophen (NORCO/VICODIN) 5-325 MG per tablet Take 1 tablet by mouth as needed.  0  . ibuprofen (ADVIL,MOTRIN) 600 MG tablet Take 600 mg by mouth every 6 (six) hours as needed for moderate pain.    Marland Kitchen ZETIA 10 MG tablet TAKE 1 TABLET ONCE DAILY. 30 tablet 11  . benzonatate (TESSALON) 100 MG capsule Take 1 capsule (100 mg total) by mouth 3 (three) times daily. (Patient not taking: Reported on 08/03/2016) 20 capsule 0  . Guaifenesin (MUCINEX MAXIMUM STRENGTH) 1200 MG TB12 Take 1 tablet (1,200 mg total) by mouth every 12 (twelve) hours as needed.  (Patient not taking: Reported on 02/20/2016) 14 tablet 1  . HYDROcodone-homatropine (HYCODAN) 5-1.5 MG/5ML syrup Take 5 mLs by mouth every 8 (eight) hours as needed for cough. (Patient not taking: Reported on 08/03/2016) 120 mL 0   Current Facility-Administered Medications  Medication Dose Route Frequency Provider Last Rate Last Dose  . 0.9 %  sodium chloride infusion  500 mL Intravenous Continuous Milus Banister, MD        Objective: BP 110/82 (BP Location: Left Arm, Patient Position: Sitting, Cuff Size: Normal)   Pulse 76   Temp 98 F (36.7 C) (Oral)   Wt 180 lb (81.6 kg)   SpO2 98%   BMI 26.01 kg/m  Gen: NAD, resting comfortably HEENT: Turbinates erythematous, TM normal, pharynx mildly erythematous with no tonsilar exudate or edema, no sinus tenderness CV: RRR no murmurs rubs or gallops Lungs: CTAB no crackles, wheeze, rhonchi Abdomen: soft/nontender/nondistended/normal bowel sounds. No rebound or guarding.  Ext: no edema Skin: warm, dry, no rash Neuro: grossly normal, moves all extremities  Assessment/Plan:  Upper Respiratory infection History and exam today are suggestive of viral infection most likely due to upper respiratory infection. Symptomatic treatment with: tylenol, and increase in fluids; enough water to keep your urine pale yellow or clear. Advised patient on supportive measures:  Get rest, drink plenty of fluids, and use tylenol or ibuprofen as needed for pain. Follow up if fever >101, if symptoms worsen or if symptoms are not improved in 3 days. Patient verbalizes understanding.   Also advised that he can add either allegra, claritin,  or zyrtec for symptoms.    We discussed that we did not find any infection that had higher probability of being bacterial such as pneumonia or strep throat. We discussed signs that bacterial infection may have developed particularly fever or shortness of breath. Likely course of 1-2 weeks. Patient is contagious and advised good  handwashing and consideration of mask If going to be in public places.   He is going out of town and is concerned about the need for an antibiotic as he has a history of asthma and has needed an antibiotic for bronchitis. We discussed that this is likely viral in nature and I advised him that a written antibiotic will be provided and he has agreed to hold off for 3 to 4 days to see if symptoms improve and if not, he will initiate this antibiotic.  Finally, we reviewed reasons to return to care including if symptoms worsen or persist or new concerns arise- once again particularly shortness of breath or fever.    Laurita Quint, FNP

## 2016-08-03 NOTE — Patient Instructions (Addendum)
It was a pleasure seeing you today. Your symptoms are most likely related to a viral illness. Please drink plenty of water so that your urine is pale yellow or clear. Also, get plenty of rest, use tylenol or ibuprofen as needed for discomfort and follow up if symptoms do not improve in 3 to 4 days, worsen, or you develop a fever >101.   WE NOW OFFER   [facility] FAST TRACK!!!  SAME DAY Appointments for ACUTE CARE  Such as: Sprains, Injuries, cuts, abrasions, rashes, muscle pain, joint pain, back pain Colds, flu, sore throats, headache, allergies, cough, fever  Ear pain, sinus and eye infections Abdominal pain, nausea, vomiting, diarrhea, upset stomach Animal/insect bites  3 Easy Ways to Schedule: Walk-In Scheduling Call in scheduling Mychart Sign-up: [email or web address]

## 2016-09-30 ENCOUNTER — Ambulatory Visit (INDEPENDENT_AMBULATORY_CARE_PROVIDER_SITE_OTHER): Payer: BLUE CROSS/BLUE SHIELD | Admitting: Family Medicine

## 2016-09-30 ENCOUNTER — Encounter: Payer: Self-pay | Admitting: Family Medicine

## 2016-09-30 DIAGNOSIS — M79671 Pain in right foot: Secondary | ICD-10-CM | POA: Diagnosis not present

## 2016-10-01 ENCOUNTER — Encounter: Payer: Self-pay | Admitting: Family Medicine

## 2016-10-01 DIAGNOSIS — M79671 Pain in right foot: Secondary | ICD-10-CM | POA: Insufficient documentation

## 2016-10-01 NOTE — Progress Notes (Signed)
  Jake [name] - 56 y.o. male MRN [id number]  Date of birth: 18-Jan-1961  SUBJECTIVE:  Including CC & ROS.   Jake [name] is a 56 yo M that is presenting with right heel pain. The pain has been present for 1 month. The pain is worse in the morning with the first few steps. He denies any prior history of the pain. He has taken advil for the pain. He has had some dry needling performed on his right calf with limited improvement. He denies any injury or inciting event to his heel or foot.   ROS: No unexpected weight loss, fever, chills, swelling, instability, muscle pain, numbness/tingling, redness, otherwise see HPI   HISTORY: Past Medical, Surgical, Social, and Family History Reviewed & Updated per EMR.   Pertinent Historical Findings include: PMHx - HLD, CAD, OSA PSx-  Neck surgery, vasectomy, laminectomy, hernia repair  PSHx -  Former smoker, occasional alcohol use   FHx -  Cancer  DATA REVIEWED: none  PHYSICAL EXAM:  VS: BP 126/82   Ht 5\' 10"  (1.778 m)   Wt 175 lb (79.4 kg)   BMI 25.11 kg/m  PHYSICAL EXAM: Gen: NAD, alert, cooperative with exam, well-appearing HEENT: clear conjunctiva, EOMI CV:  no edema, capillary refill brisk,  Resp: non-labored, normal speech Skin: no rashes, normal turgor  Neuro: no gross deficits.  Psych:  alert and oriented MSK:  Right foot:  No obvious swelling or ecchymosis  No TTp of the achilles or its insertion TTp of the calcaneous  No TTp of the plantar aspect of the great toe  Some loss of transverse arch  More of a pes cavus foot  Normal ankle ROM  Normal strength  Able to rise up on tip toes.  Neurovascularly intact   Limited ultrasound: right foot:  The plantar fascia was viewed in long axis and there appears to be a calcification on the lateral aspect of its origin. There is also an increase in the vascularity in this region.  The PF at its origin appears to be thickened at roughly 0.7 cm.  The PF was viewed through to the great toe  and that was normal. The midbelly and insertion of the achilles was viewed and normal in appearance.  Summary: Findings consistent with plantar fasciitis    Ultrasound and interpretation by Clearance Coots, MD   ASSESSMENT & PLAN:   Pain of right heel Likely he has Plantar fasciitis. He is fairly active and wants to remain active.  - mid foot arch strap today  - provided home exercises  - f/u in one month, if no improvement can consider an injection.

## 2016-10-01 NOTE — Assessment & Plan Note (Signed)
Likely he has Plantar fasciitis. He is fairly active and wants to remain active.  - mid foot arch strap today  - provided home exercises  - f/u in one month, if no improvement can consider an injection.

## 2016-10-22 ENCOUNTER — Ambulatory Visit: Payer: BLUE CROSS/BLUE SHIELD | Admitting: Sports Medicine

## 2016-10-28 ENCOUNTER — Ambulatory Visit (INDEPENDENT_AMBULATORY_CARE_PROVIDER_SITE_OTHER): Payer: BLUE CROSS/BLUE SHIELD | Admitting: Family Medicine

## 2016-10-28 DIAGNOSIS — M79671 Pain in right foot: Secondary | ICD-10-CM

## 2016-10-28 NOTE — Progress Notes (Signed)
  Jake [name] - 56 y.o. male MRN [id number]  Date of birth: 06/20/1960  SUBJECTIVE:  Including CC & ROS.   Jake [name] is a 56 her old male that is following up for right plantar fasciitis. He reports that the pain is significantly improved since last time he was seen. He is no longer limping. He is not currently taking any medications. He has had some physical therapy treatments with dry needling and that has improved his pain. He has not gotten back to running plans to do so. The pain is usually occurring on the plantar aspect of his right calcaneus.  ROS: No unexpected weight loss, fever, chills, swelling, instability, muscle pain, numbness/tingling, redness, otherwise see HPI   HISTORY: Past Medical, Surgical, Social, and Family History Reviewed & Updated per EMR.   Pertinent Historical Findings include: Surgical:   Neck surgery, laminectomy, hernia repair Social:  Former smoker  DATA REVIEWED: Previous ultrasound  PHYSICAL EXAM:  VS: BP 130/64   Ht 5\' 10"  (1.778 m)   Wt 175 lb (79.4 kg)   BMI 25.11 kg/m  PHYSICAL EXAM: Gen: NAD, alert, cooperative with exam, well-appearing HEENT: clear conjunctiva, EOMI CV:  no edema, capillary refill brisk,  Resp: non-labored, normal speech Skin: no rashes, normal turgor  Neuro: no gross deficits.  Psych:  alert and oriented MSK:  Right foot:  No obvious swelling or ecchymosis. Some tenderness to palpation of the plantar aspect of the calcaneus. Some loss of the transverse arch. Neurovascular intact.  ASSESSMENT & PLAN:   Pain of right heel Having improvement today of his plantar fasciitis. We will continue conservative care and he didn't get back to doing some of his regular running and exercising. - Can follow-up as needed but would consider an injection if pain worsens.

## 2016-10-28 NOTE — Assessment & Plan Note (Signed)
Having improvement today of his plantar fasciitis. We will continue conservative care and he didn't get back to doing some of his regular running and exercising. - Can follow-up as needed but would consider an injection if pain worsens.

## 2016-11-03 ENCOUNTER — Other Ambulatory Visit: Payer: Self-pay | Admitting: Cardiology

## 2016-11-17 DIAGNOSIS — D3131 Benign neoplasm of right choroid: Secondary | ICD-10-CM | POA: Diagnosis not present

## 2016-12-10 DIAGNOSIS — H35041 Retinal micro-aneurysms, unspecified, right eye: Secondary | ICD-10-CM | POA: Diagnosis not present

## 2016-12-10 DIAGNOSIS — D3131 Benign neoplasm of right choroid: Secondary | ICD-10-CM | POA: Diagnosis not present

## 2016-12-10 DIAGNOSIS — H43813 Vitreous degeneration, bilateral: Secondary | ICD-10-CM | POA: Diagnosis not present

## 2016-12-10 DIAGNOSIS — H35362 Drusen (degenerative) of macula, left eye: Secondary | ICD-10-CM | POA: Diagnosis not present

## 2016-12-14 DIAGNOSIS — L821 Other seborrheic keratosis: Secondary | ICD-10-CM | POA: Diagnosis not present

## 2016-12-14 DIAGNOSIS — Z85828 Personal history of other malignant neoplasm of skin: Secondary | ICD-10-CM | POA: Diagnosis not present

## 2016-12-14 DIAGNOSIS — D2271 Melanocytic nevi of right lower limb, including hip: Secondary | ICD-10-CM | POA: Diagnosis not present

## 2016-12-14 DIAGNOSIS — L249 Irritant contact dermatitis, unspecified cause: Secondary | ICD-10-CM | POA: Diagnosis not present

## 2016-12-17 ENCOUNTER — Other Ambulatory Visit: Payer: Self-pay | Admitting: *Deleted

## 2016-12-17 MED ORDER — EZETIMIBE 10 MG PO TABS: 10.0000 mg | ORAL_TABLET | Freq: Every day | ORAL | 0 refills | Status: DC

## 2017-01-22 ENCOUNTER — Other Ambulatory Visit: Payer: Self-pay | Admitting: Cardiology

## 2017-01-27 DIAGNOSIS — H43813 Vitreous degeneration, bilateral: Secondary | ICD-10-CM | POA: Diagnosis not present

## 2017-02-10 DIAGNOSIS — Z85828 Personal history of other malignant neoplasm of skin: Secondary | ICD-10-CM | POA: Diagnosis not present

## 2017-02-10 DIAGNOSIS — F902 Attention-deficit hyperactivity disorder, combined type: Secondary | ICD-10-CM | POA: Diagnosis not present

## 2017-02-10 DIAGNOSIS — L249 Irritant contact dermatitis, unspecified cause: Secondary | ICD-10-CM | POA: Diagnosis not present

## 2017-02-10 DIAGNOSIS — L300 Nummular dermatitis: Secondary | ICD-10-CM | POA: Diagnosis not present

## 2017-02-11 DIAGNOSIS — F902 Attention-deficit hyperactivity disorder, combined type: Secondary | ICD-10-CM | POA: Diagnosis not present

## 2017-02-19 ENCOUNTER — Encounter: Payer: Self-pay | Admitting: Family Medicine

## 2017-03-10 DIAGNOSIS — L2089 Other atopic dermatitis: Secondary | ICD-10-CM | POA: Diagnosis not present

## 2017-03-10 DIAGNOSIS — Z85828 Personal history of other malignant neoplasm of skin: Secondary | ICD-10-CM | POA: Diagnosis not present

## 2017-03-10 DIAGNOSIS — L300 Nummular dermatitis: Secondary | ICD-10-CM | POA: Diagnosis not present

## 2017-03-17 ENCOUNTER — Encounter: Payer: Self-pay | Admitting: Cardiology

## 2017-03-31 ENCOUNTER — Encounter: Payer: Self-pay | Admitting: Cardiology

## 2017-03-31 ENCOUNTER — Ambulatory Visit (INDEPENDENT_AMBULATORY_CARE_PROVIDER_SITE_OTHER): Payer: BLUE CROSS/BLUE SHIELD | Admitting: Cardiology

## 2017-03-31 VITALS — BP 124/80 | HR 70 | Ht 70.0 in | Wt 183.0 lb

## 2017-03-31 DIAGNOSIS — I2510 Atherosclerotic heart disease of native coronary artery without angina pectoris: Secondary | ICD-10-CM

## 2017-03-31 DIAGNOSIS — M542 Cervicalgia: Secondary | ICD-10-CM | POA: Diagnosis not present

## 2017-03-31 DIAGNOSIS — I25119 Atherosclerotic heart disease of native coronary artery with unspecified angina pectoris: Secondary | ICD-10-CM

## 2017-03-31 DIAGNOSIS — Z23 Encounter for immunization: Secondary | ICD-10-CM

## 2017-03-31 DIAGNOSIS — E785 Hyperlipidemia, unspecified: Secondary | ICD-10-CM | POA: Diagnosis not present

## 2017-03-31 DIAGNOSIS — Z789 Other specified health status: Secondary | ICD-10-CM

## 2017-03-31 LAB — CBC
Hematocrit: 41 % (ref 37.5–51.0)
Hemoglobin: 14.5 g/dL (ref 13.0–17.7)
MCH: 31.7 pg (ref 26.6–33.0)
MCHC: 35.4 g/dL (ref 31.5–35.7)
MCV: 90 fL (ref 79–97)
Platelets: 273 10*3/uL (ref 150–379)
RBC: 4.58 x10E6/uL (ref 4.14–5.80)
RDW: 12.3 % (ref 12.3–15.4)
WBC: 6.2 10*3/uL (ref 3.4–10.8)

## 2017-03-31 LAB — HEPATIC FUNCTION PANEL
ALT: 23 [IU]/L (ref 0–44)
AST: 19 [IU]/L (ref 0–40)
Albumin: 4.7 g/dL (ref 3.5–5.5)
Alkaline Phosphatase: 60 [IU]/L (ref 39–117)
Bilirubin Total: 0.5 mg/dL (ref 0.0–1.2)
Bilirubin, Direct: 0.2 mg/dL (ref 0.00–0.40)
Total Protein: 6.6 g/dL (ref 6.0–8.5)

## 2017-03-31 LAB — TSH: TSH: 2.38 u[IU]/mL (ref 0.450–4.500)

## 2017-03-31 LAB — BASIC METABOLIC PANEL WITH GFR
BUN/Creatinine Ratio: 12 (ref 9–20)
BUN: 11 mg/dL (ref 6–24)
CO2: 25 mmol/L (ref 20–29)
Calcium: 9.3 mg/dL (ref 8.7–10.2)
Chloride: 101 mmol/L (ref 96–106)
Creatinine, Ser: 0.94 mg/dL (ref 0.76–1.27)
GFR calc Af Amer: 104 mL/min/{1.73_m2}
GFR calc non Af Amer: 90 mL/min/{1.73_m2}
Glucose: 90 mg/dL (ref 65–99)
Potassium: 4 mmol/L (ref 3.5–5.2)
Sodium: 142 mmol/L (ref 134–144)

## 2017-03-31 LAB — LIPID PANEL
Chol/HDL Ratio: 1.7 ratio (ref 0.0–5.0)
Cholesterol, Total: 103 mg/dL (ref 100–199)
HDL: 62 mg/dL (ref >39)
LDL Calculated: 26 mg/dL (ref 0–99)
Triglycerides: 76 mg/dL (ref 0–149)
VLDL Cholesterol Cal: 15 mg/dL (ref 5–40)

## 2017-03-31 NOTE — Progress Notes (Signed)
Patient ID: MY MADARIAGA, male   DOB: [birth year], 56 y.o.   MRN: [id number]    Patient Name: Jake [name] Date of Encounter: 03/31/2017  Primary Care Provider:  Dorena Cookey, MD Primary Cardiologist:  Ena Dawley  Problem List   Past Medical History:  Diagnosis Date  . Allergy   . Arthritis   . Asthma   . GERD (gastroesophageal reflux disease)   . Hyperlipidemia   . Sleep apnea    pt denies   Past Surgical History:  Procedure Laterality Date  . INGUINAL HERNIA REPAIR  2009  . LUMBAR LAMINECTOMY  1997  . NECK SURGERY  01/2010   c5-7  . VASECTOMY      Allergies  Allergies  Allergen Reactions  . Other Rash    Wool pants caused rash   . Latex     REACTION: RASH  . Lipitor [Atorvastatin]     Muscle aches at 20 mg    . Zocor [Simvastatin]     Muscle aches at 20 mg daily    HPI  Jake [name] is a 56 y.o. male who in the last two weeks presented to the ER twice for episodes of chest pain tthat are retrosternal, sharp, lasting 5-10 seconds and are sometimes associated with exertion. There is no clear exacerbating or alleviating factor. It started when the patient had asthma flare up, but it has been treated and his pain continue to happen on daily basis.  The pain is associated with SOB, headaches and radiates to the neck and left arm. No relief with change of position.  He states that while driving to [place] 2 weeks ago he had a brief moment where he thought he was going to experience a syncopal episode. He is very anxious about it.  The patient denies association with food intake. He denies hypertension, hyperlipidemia, diabetes or family h/o CAD, he quit smoking 20 years ago.  Today he is coming after two weeks. He complains of necks tingling and pain as well as of intermittent pain at the bilateral temporal area. He is seeing a neurologist that is planning to perform brain MRI and transcranial Doppler.  11/27/2013 - no typical chest pain, the patient  is going through a lot ofd stress, he has a dying father, who was placed at hospice and is experiencing occasional non-exertional chest pressure. He was taken off Lipitor and simvastatin for significant muscle pain. He was restarted on Crestor 5 mg daily. He is LFTs were always normal however his CPK was not checked. He's also complaining of is a on and off pain in his left neck and left axilla pain as well as tingling in his left arm. He is a former smoker quit 21 years ago but has no cough right now.  03/31/2017 - 6 months follow up, feels great, he gets occasional non-exertional chest pains, he has no SOB, stays active. No palpitations or syncope. His complains are skin lesions from Repatha and asking where else other than belly can he inject it. Also polyarthralgia with pains in different jonts.   Home Medications  Prior to Admission medications   Medication Sig Start Date End Date Taking? Authorizing Provider  acetaminophen (TYLENOL) 325 MG tablet Take 650 mg by mouth every 6 (six) hours as needed.      Historical Provider, MD  albuterol (VENTOLIN HFA) 108 (90 BASE) MCG/ACT inhaler Inhale 2 puffs into the lungs as needed. 04/12/13   Wardell Honour, MD  beclomethasone Francena Hanly  AQ) 42 MCG/SPRAY nasal spray Place 2 sprays into both nostrils 2 (two) times daily. Dose is for each nostril. 04/13/13   Dorena Cookey, MD  fluticasone (FLOVENT HFA) 44 MCG/ACT inhaler Inhale 1 puff into the lungs 2 (two) times daily. 04/12/13   Wardell Honour, MD  HYDROcodone-acetaminophen (VICODIN) 5-500 MG per tablet Take 5-500 mg by mouth as needed. 01/28/12   Historical Provider, MD  ibuprofen (ADVIL,MOTRIN) 400 MG tablet Take 400 mg by mouth every 6 (six) hours as needed.      Historical Provider, MD  loratadine (CLARITIN) 10 MG tablet Take 10 mg by mouth daily.    Historical Provider, MD  ranitidine (ZANTAC) 150 MG capsule Take 150 mg by mouth 2 (two) times daily.      Historical Provider, MD    Family  History  Family History  Problem Relation Age of Onset  . Osteoporosis Mother   . Parkinson's disease Father   . Cancer Father   . Alcohol abuse Unknown   . Asthma Unknown   . Depression Unknown   . Valvular heart disease Unknown   . Colon cancer Paternal Grandmother 27    Social History  Social History   Social History  . Marital status: Married    Spouse name: [name]  . Number of children: 4  . Years of education: BA   Occupational History  . [employer]    Social History Main Topics  . Smoking status: Former Smoker    Packs/day: 0.50    Years: 20.00    Types: Cigarettes    Quit date: 08/19/1991  . Smokeless tobacco: Never Used  . Alcohol use 6.0 oz/week    10 Standard drinks or equivalent per week  . Drug use: No  . Sexual activity: Not on file   Other Topics Concern  . Not on file   Social History Narrative   Patient is married [name]) and has four children.   Patient lives at home with his wife and children.   Patient is working  At [employer].   Patient has a BA degree.   Patient used both hands.   Patient drinks three cups of coffee daily.     Review of Systems, as per HPI, otherwise negative General:  No chills, fever, night sweats or weight changes.  Cardiovascular:  No chest pain, dyspnea on exertion, edema, orthopnea, palpitations, paroxysmal nocturnal dyspnea. Dermatological: No rash, lesions/masses Respiratory: No cough, dyspnea Urologic: No hematuria, dysuria Abdominal:   No nausea, vomiting, diarrhea, bright red blood per rectum, melena, or hematemesis Neurologic:  No visual changes, wkns, changes in mental status. All other systems reviewed and are otherwise negative except as noted above.  Physical Exam  BP 129/80, HR 93 BPM General: Pleasant, NAD Psych: Normal affect. Neuro: Alert and oriented X 3. Moves all extremities spontaneously. HEENT: Normal  Neck: Supple without bruits or JVD. Lungs:  Resp regular and  unlabored, CTA. Heart: RRR no s3, s4, or murmurs. Abdomen: Soft, non-tender, non-distended, BS + x 4.  Extremities: No clubbing, cyanosis or edema. DP/PT/Radials 2+ and equal bilaterally.  Labs:  No results for input(s): CKTOTAL, CKMB, TROPONINI in the last 72 hours. Lab Results  Component Value Date   WBC 6.4 01/29/2014   HGB 14.3 01/29/2014   HCT 40.3 01/29/2014   MCV 87.0 01/29/2014   PLT 251 01/29/2014   No results for input(s): NA, K, CL, CO2, BUN, CREATININE, CALCIUM, PROT, BILITOT, ALKPHOS, ALT, AST, GLUCOSE  in the last 168 hours.  Invalid input(s): LABALBU  Lipid Panel     Component Value Date/Time   CHOL 101 (L) 12/24/2015 0750   CHOL 166 10/02/2014 0805   TRIG 77 12/24/2015 0750   TRIG 97 10/02/2014 0805   HDL 66 12/24/2015 0750   HDL 50 10/02/2014 0805   CHOLHDL 1.5 12/24/2015 0750   CHOLHDL 5 05/08/2013 0754   VLDL 27.2 05/08/2013 0754   LDLCALC 20 12/24/2015 0750   LDLCALC 97 10/02/2014 0805   Accessory Clinical Findings  Echocardiogram - none  ECG - sinus bradycardia, 53 beats per minutes, normal EKG.  Coronary CT 04/26/2013  Coronary arteries:  Left main is a large vessel with minimal non-calcified plague with 0-25 % stenosis. Left main gives rise to LAD, ramus intermedium and LCx artery.  LAD is a large vessel with a mid plague in its ostial portion with associated 25-50% stenosis. There are 2 other calcified plagues in the proximal LAD with associated 25-50% stenosis. LAD only has 0-25% stenosis in its mid and distal segment. First diagonal vessel is rather small and only has mild luminal irregularities.  RI is moderate caliber vessel that further divides into two vessels and gives rise to the first septal branch. These two ramuses supply the entire anterolateral wall and have only mild luminal irregularities.  LCx is a small to moderate caliber non-dominant vessel that gives rise to one very small obtuse marginal branch. There is  mild non-calcified plague in the proximal LCx artery associated with 0-25% stenosis.  RCA is a large dominant vessel that gives rise to PDA and PLVB. RCA has a mild calcified plague that is associated with 0-25% stenosis. The rest of RCA has only luminal irregularities. PDA is a large vessel that runs all the way to the apex. There are only luminal irregularities. PLVB is a small vessel without significant stenosis.  Pericardium: Normal  IMPRESSION: 1. Coronary calcium score of 39.82. This was 59 percentile for age and sex matched control.  2. Non-obstructive CAD. Intense medical management is recommended.  Ena Dawley  EKG performed today 11/04/2015 shows sinus rhythm with first-degree AV block otherwise normal EKG and unchanged from prior.    Assessment & Plan  1. Non-obstructive CAD Very rare chest pains, atypical, continue Repatha and zetia.   2. HLP - as above, with evidence of nonobstructive CAD and significant family history of premature coronary artery disease. We will discuss injections sites with pharmacy.  3. Neck and temporal pain, carotid US was normal B/L. Resolved.  Follow up in 6 months.  Ena Dawley, MD, [facility] 03/31/2017, 8:25 AM

## 2017-03-31 NOTE — Patient Instructions (Signed)
Medication Instructions:  Your physician recommends that you continue on your current medications as directed. Please refer to the Current Medication list given to you today.  Labwork: Today for BMET, CBC, TSH, LFTs, and lipids  Testing/Procedures: None ordered  Follow-Up: Your physician wants you to follow-up in: 6 months with Dr. Meda Coffee. You will receive a reminder letter in the mail two months in advance. If you don't receive a letter, please call our office to schedule the follow-up appointment.  Any Other Special Instructions Will Be Listed Below (If Applicable).     If you need a refill on your cardiac medications before your next appointment, please call your pharmacy.

## 2017-05-02 ENCOUNTER — Other Ambulatory Visit: Payer: Self-pay | Admitting: Cardiology

## 2017-05-20 DIAGNOSIS — M19041 Primary osteoarthritis, right hand: Secondary | ICD-10-CM | POA: Diagnosis not present

## 2017-05-20 DIAGNOSIS — M542 Cervicalgia: Secondary | ICD-10-CM | POA: Diagnosis not present

## 2017-05-20 DIAGNOSIS — M79642 Pain in left hand: Secondary | ICD-10-CM | POA: Diagnosis not present

## 2017-05-20 DIAGNOSIS — M79641 Pain in right hand: Secondary | ICD-10-CM | POA: Diagnosis not present

## 2017-06-10 DIAGNOSIS — H43813 Vitreous degeneration, bilateral: Secondary | ICD-10-CM | POA: Diagnosis not present

## 2017-06-10 DIAGNOSIS — D3131 Benign neoplasm of right choroid: Secondary | ICD-10-CM | POA: Diagnosis not present

## 2017-06-10 DIAGNOSIS — H35362 Drusen (degenerative) of macula, left eye: Secondary | ICD-10-CM | POA: Diagnosis not present

## 2017-06-10 DIAGNOSIS — H35041 Retinal micro-aneurysms, unspecified, right eye: Secondary | ICD-10-CM | POA: Diagnosis not present

## 2017-06-30 DIAGNOSIS — F902 Attention-deficit hyperactivity disorder, combined type: Secondary | ICD-10-CM | POA: Diagnosis not present

## 2017-07-13 DIAGNOSIS — H9312 Tinnitus, left ear: Secondary | ICD-10-CM | POA: Diagnosis not present

## 2017-07-13 DIAGNOSIS — H903 Sensorineural hearing loss, bilateral: Secondary | ICD-10-CM | POA: Diagnosis not present

## 2017-07-13 DIAGNOSIS — H6122 Impacted cerumen, left ear: Secondary | ICD-10-CM | POA: Diagnosis not present

## 2017-08-16 DIAGNOSIS — L239 Allergic contact dermatitis, unspecified cause: Secondary | ICD-10-CM | POA: Diagnosis not present

## 2017-08-16 DIAGNOSIS — H9312 Tinnitus, left ear: Secondary | ICD-10-CM | POA: Diagnosis not present

## 2017-08-16 DIAGNOSIS — H903 Sensorineural hearing loss, bilateral: Secondary | ICD-10-CM | POA: Diagnosis not present

## 2017-08-16 DIAGNOSIS — H6122 Impacted cerumen, left ear: Secondary | ICD-10-CM | POA: Diagnosis not present

## 2017-08-31 ENCOUNTER — Ambulatory Visit: Payer: BLUE CROSS/BLUE SHIELD | Admitting: Family Medicine

## 2017-08-31 DIAGNOSIS — H903 Sensorineural hearing loss, bilateral: Secondary | ICD-10-CM | POA: Diagnosis not present

## 2017-08-31 DIAGNOSIS — H9312 Tinnitus, left ear: Secondary | ICD-10-CM | POA: Diagnosis not present

## 2017-09-05 ENCOUNTER — Emergency Department (HOSPITAL_COMMUNITY)
Admission: EM | Admit: 2017-09-05 | Discharge: 2017-09-05 | Disposition: A | Discharge status: Home / Self Care | Payer: BLUE CROSS/BLUE SHIELD | Attending: Emergency Medicine | Admitting: Emergency Medicine

## 2017-09-05 ENCOUNTER — Other Ambulatory Visit: Payer: Self-pay

## 2017-09-05 ENCOUNTER — Emergency Department (HOSPITAL_COMMUNITY): Payer: BLUE CROSS/BLUE SHIELD

## 2017-09-05 ENCOUNTER — Encounter (HOSPITAL_COMMUNITY): Payer: Self-pay | Admitting: Emergency Medicine

## 2017-09-05 DIAGNOSIS — R002 Palpitations: Secondary | ICD-10-CM

## 2017-09-05 DIAGNOSIS — Z91040 Latex allergy status: Secondary | ICD-10-CM | POA: Diagnosis not present

## 2017-09-05 DIAGNOSIS — Z87891 Personal history of nicotine dependence: Secondary | ICD-10-CM | POA: Insufficient documentation

## 2017-09-05 DIAGNOSIS — J45909 Unspecified asthma, uncomplicated: Secondary | ICD-10-CM | POA: Insufficient documentation

## 2017-09-05 DIAGNOSIS — I2510 Atherosclerotic heart disease of native coronary artery without angina pectoris: Secondary | ICD-10-CM | POA: Insufficient documentation

## 2017-09-05 DIAGNOSIS — R0789 Other chest pain: Secondary | ICD-10-CM | POA: Diagnosis not present

## 2017-09-05 DIAGNOSIS — I491 Atrial premature depolarization: Secondary | ICD-10-CM | POA: Diagnosis not present

## 2017-09-05 DIAGNOSIS — I4510 Unspecified right bundle-branch block: Secondary | ICD-10-CM | POA: Diagnosis not present

## 2017-09-05 DIAGNOSIS — Z79899 Other long term (current) drug therapy: Secondary | ICD-10-CM | POA: Diagnosis not present

## 2017-09-05 DIAGNOSIS — R079 Chest pain, unspecified: Secondary | ICD-10-CM | POA: Diagnosis not present

## 2017-09-05 LAB — I-STAT TROPONIN, ED
Troponin i, poc: 0 ng/mL (ref 0.00–0.08)
Troponin i, poc: 0 ng/mL (ref 0.00–0.08)

## 2017-09-05 LAB — BASIC METABOLIC PANEL
Anion gap: 11 (ref 5–15)
BUN: 8 mg/dL (ref 6–20)
CO2: 26 mmol/L (ref 22–32)
Calcium: 9.5 mg/dL (ref 8.9–10.3)
Chloride: 104 mmol/L (ref 101–111)
Creatinine, Ser: 0.94 mg/dL (ref 0.61–1.24)
GFR calc Af Amer: >60 mL/min (ref >60)
GFR calc non Af Amer: >60 mL/min (ref >60)
Glucose, Bld: 106 mg/dL — ABNORMAL HIGH (ref 65–99)
Potassium: 3.7 mmol/L (ref 3.5–5.1)
Sodium: 141 mmol/L (ref 135–145)

## 2017-09-05 LAB — CBC
HCT: 44.4 % (ref 39.0–52.0)
Hemoglobin: 15.1 g/dL (ref 13.0–17.0)
MCH: 31.1 pg (ref 26.0–34.0)
MCHC: 34 g/dL (ref 30.0–36.0)
MCV: 91.4 fL (ref 78.0–100.0)
Platelets: 254 10*3/uL (ref 150–400)
RBC: 4.86 MIL/uL (ref 4.22–5.81)
RDW: 11.9 % (ref 11.5–15.5)
WBC: 6.8 10*3/uL (ref 4.0–10.5)

## 2017-09-05 MED ORDER — PREDNISONE 10 MG PO TABS: 40.0000 mg | ORAL_TABLET | Freq: Every day | ORAL | 0 refills | Status: AC

## 2017-09-05 MED ORDER — IPRATROPIUM-ALBUTEROL 0.5-2.5 (3) MG/3ML IN SOLN
3.0000 mL | Freq: Once | RESPIRATORY_TRACT | Status: AC
  Administered 2017-09-05: 3 mL via RESPIRATORY_TRACT
  Filled 2017-09-05: qty 3

## 2017-09-05 NOTE — ED Triage Notes (Signed)
Pt. Stated, I started having chest pain with a little headache . I was outside pressure washing outside for about 3 hours. The pain stayed with me all night and it was there today.

## 2017-09-05 NOTE — ED Provider Notes (Signed)
[facility] EMERGENCY DEPARTMENT Provider Note  CSN: [id number] Arrival date & time: 09/05/17 1258  Chief Complaint(s) Chest Pain and Headache  HPI Jake [name] is a 57 y.o. male   The history is provided by the patient.  Chest Pain   This is a new problem. The current episode started yesterday. The problem occurs constantly. Progression since onset: fluctuating. The pain is present in the substernal region. Pain severity now: mild to moderate. The quality of the pain is described as pressure-like. Associated symptoms include headaches, irregular heartbeat and palpitations. Pertinent negatives include no cough, no diaphoresis, no leg pain, no lower extremity edema, no nausea and no shortness of breath. Risk factors include male gender.  His past medical history is significant for CAD and hyperlipidemia.  Pertinent negatives for past medical history include no diabetes, no DVT, no hypertension, no MI, no PE, no strokes and no TIA.  Pertinent negatives for family medical history include: no early MI.  Headache   This is a recurrent problem. Associated symptoms include palpitations. Pertinent negatives include no shortness of breath and no nausea.    Past Medical History Past Medical History:  Diagnosis Date  . Allergy   . Arthritis   . Asthma   . GERD (gastroesophageal reflux disease)   . Hyperlipidemia   . Sleep apnea    pt denies   Patient Active Problem List   Diagnosis Date Noted  . Pain of right heel 10/01/2016  . Obstructive sleep apnea 02/12/2015  . Hyperlipidemia 07/09/2014  . CAD (coronary artery disease) 07/04/2014  . Other and unspecified hyperlipidemia 05/16/2013  . External hemorrhoid 05/04/2013  . Chest pain at rest 05/03/2013  . Neck pain 05/03/2013  . Dizziness 05/03/2013  . Atherosclerotic coronary vascular disease 05/03/2013  . Chest pain 04/24/2013  . Prostatitis 04/05/2012  . Asthma 10/03/2007  . GERD 10/03/2007  . VENTRAL HERNIA,  SMALL 10/03/2007  . Allergic rhinitis, cause unspecified 03/01/2007  . ESOPHAGEAL STRICTURE 03/01/2007  . POLYP, COLON 10/27/2005  . 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 Authorizing Provider  albuterol (PROVENTIL HFA;VENTOLIN HFA) 108 (90 Base) MCG/ACT inhaler Inhale 2 puffs into the lungs every 6 (six) hours as needed for wheezing or shortness of breath (cough, shortness of breath or wheezing.). 01/28/16   Scot Jun, FNP  ezetimibe (ZETIA) 10 MG tablet TAKE 1 TABLET ONCE DAILY. 05/03/17   Dorothy Spark, MD  fluticasone (FLONASE) 50 MCG/ACT nasal spray Place 2 sprays into both nostrils daily. 01/28/16   Scot Jun, FNP  fluticasone (FLOVENT HFA) 44 MCG/ACT inhaler Inhale 1 puff into the lungs 2 (two) times daily. 06/13/15   Deneise Lever, MD  ibuprofen (ADVIL,MOTRIN) 600 MG tablet Take 600 mg by mouth every 6 (six) hours as needed for moderate pain.    [provider]  predniSONE (DELTASONE) 10 MG tablet Take 4 tablets (40 mg total) by mouth daily for 5 days. 09/05/17 09/10/17  Fatima Blank, [place] MG/3.5ML SOCT ADMINISTER [place] ONCE EVERY 30 DAYS 11/04/16   Dorothy Spark, MD  Past Surgical History Past Surgical History:  Procedure Laterality Date  . INGUINAL HERNIA REPAIR  2009  . LUMBAR LAMINECTOMY  1997  . NECK SURGERY  01/2010   c5-7  . VASECTOMY     Family History Family History  Problem Relation Age of Onset  . Osteoporosis Mother   . Parkinson's disease Father   . Cancer Father   . Alcohol abuse Unknown   . Asthma Unknown   . Depression Unknown   . Valvular heart disease Unknown   . Colon cancer Paternal Grandmother 68    Social History Social History   Tobacco Use    . Smoking status: Former Smoker    Packs/day: 0.50    Years: 20.00    Pack years: 10.00    Types: Cigarettes    Last attempt to quit: 08/19/1991    Years since quitting: 26.0  . Smokeless tobacco: Never Used  Substance Use Topics  . Alcohol use: Yes    Alcohol/week: 6.0 oz    Types: 10 Standard drinks or equivalent per week  . Drug use: No   Allergies Other; Latex; Lipitor [atorvastatin]; and Zocor [simvastatin]  Review of Systems Review of Systems  Constitutional: Negative for diaphoresis.  Respiratory: Negative for cough and shortness of breath.   Cardiovascular: Positive for chest pain and palpitations.  Gastrointestinal: Negative for nausea.  Neurological: Positive for headaches.   All other systems are reviewed and are negative for acute change except as noted in the HPI  Physical Exam Vital Signs  I have reviewed the triage vital signs BP (!) 137/92 (BP Location: Left Arm)   Pulse 77   Temp 98.3 F (36.8 C) (Oral)   Resp 17   Ht 5\' 10"  (1.778 m)   Wt 81.6 kg (180 lb)   SpO2 99%   BMI 25.83 kg/m   Physical Exam  Constitutional: He is oriented to person, place, and time. He appears well-developed and well-nourished. No distress.  HENT:  Head: Normocephalic and atraumatic.  Nose: Nose normal.  Eyes: Pupils are equal, round, and reactive to light. Conjunctivae and EOM are normal. Right eye exhibits no discharge. Left eye exhibits no discharge. No scleral icterus.  Neck: Normal range of motion. Neck supple.  Cardiovascular: Normal rate and regular rhythm. Exam reveals no gallop and no friction rub.  No murmur heard. Pulmonary/Chest: Effort normal and breath sounds normal. No stridor. No respiratory distress. He has no rales.  Abdominal: Soft. He exhibits no distension. There is no tenderness.  Musculoskeletal: He exhibits no edema or tenderness.  Neurological: He is alert and oriented to person, place, and time.  Skin: Skin is warm and dry. No rash noted. He is  not diaphoretic. No erythema.  Psychiatric: He has a normal mood and affect.  Vitals reviewed.   ED Results and Treatments Labs (all labs ordered are listed, but only abnormal results are displayed) Labs Reviewed  BASIC METABOLIC PANEL - Abnormal; Notable for the following components:      Result Value   Glucose, Bld 106 (*)    All other components within normal limits  CBC  I-STAT TROPONIN, ED  I-STAT TROPONIN, ED  EKG  EKG Interpretation  Date/Time:  Sunday September 05 2017 13:05:56 EDT Ventricular Rate:  85 PR Interval:  204 QRS Duration: 110 QT Interval:  378 QTC Calculation: 449 R Axis:   87 Text Interpretation:  Sinus rhythm with Premature atrial complexes Incomplete right bundle branch block Borderline ECG No significant change since last tracing Confirmed by Addison Lank [phone]) on 09/05/2017 4:38:08 PM      Radiology Dg Chest 2 View  Result Date: 09/05/2017 CLINICAL DATA:  57 year old male with chest pain EXAM: CHEST - 2 VIEW COMPARISON:  Prior CT scan of the chest 06/21/2015 FINDINGS: The lungs are clear and negative for focal airspace consolidation, pulmonary edema or suspicious pulmonary nodule. No pleural effusion or pneumothorax. Cardiac and mediastinal contours are within normal limits. No acute fracture or lytic or blastic osseous lesions. The visualized upper abdominal bowel gas pattern is unremarkable. Incompletely imaged anterior cervical fusion hardware. IMPRESSION: Negative chest x-ray. Electronically Signed   By: Jacqulynn Cadet M.D.   On: 09/05/2017 13:32   Pertinent labs & imaging results that were available during my care of the patient were reviewed by me and considered in my medical decision making (see chart for details).  Medications Ordered in ED Medications  ipratropium-albuterol (DUONEB) 0.5-2.5 (3) MG/3ML nebulizer solution 3 mL  (3 mLs Nebulization Given 09/05/17 1724)                                                                                                                                    Procedures Procedures  (including critical care time)  Medical Decision Making / ED Course I have reviewed the nursing notes for this encounter and the patient's prior records (if available in EHR or on provided paperwork).    Atypical chest pain.  EKG without acute ischemic changes or evidence of pericarditis.  Initial troponin negative.  Patient does have a history of mild CAD without need for intervention.  Given duration of symptoms, feel he is appropriate for delta troponin to rule out ACS.  Low pretest probability for pulmonary embolism.  Presentation not classic for aortic dissection or esophageal perforation. Chest x-ray without evidence suggestive of pneumonia, pneumothorax, pneumomediastinum.  No abnormal contour of the mediastinum to suggest dissection. No evidence of acute injuries.  Symptoms may also be secondary to asthma.  He was provided with DuoNeb resulting in improved pressure.  With regards to the patient's palpitations, we were able to on telemetry capture PACs, which correlated with his palpitations.  Given his increased use of albuterol recently, I feel that this is secondary to the albuterol.   Delta troponin was negative.  The patient appears reasonably screened and/or stabilized for discharge and I doubt any other medical condition or other [facility] requiring further screening, evaluation, or treatment in the ED at this time prior to discharge.  The patient is safe for discharge with strict return precautions.   Final Clinical Impression(s) / ED  Diagnoses Final diagnoses:  Atypical chest pain  Palpitations  PAC (premature atrial contraction)    Disposition: Discharge  Condition: Good  I have discussed the results, Dx and Tx plan with the patient who expressed understanding and agree(s) with the  plan. Discharge instructions discussed at great length. The patient was given strict return precautions who verbalized understanding of the instructions. No further questions at time of discharge.    ED Discharge Orders        Ordered    predniSONE (DELTASONE) 10 MG tablet  Daily     09/05/17 1823       Follow Up: Dorena Cookey, MD [place] [address] [phone]  Schedule an appointment as soon as possible for a visit  As needed     This chart was dictated using voice recognition software.  Despite best efforts to proofread,  errors can occur which can change the documentation meaning.   Fatima Blank, MD 09/05/17 [phone]

## 2017-09-07 ENCOUNTER — Ambulatory Visit (INDEPENDENT_AMBULATORY_CARE_PROVIDER_SITE_OTHER): Payer: BLUE CROSS/BLUE SHIELD | Admitting: Cardiology

## 2017-09-07 ENCOUNTER — Telehealth: Payer: Self-pay

## 2017-09-07 ENCOUNTER — Encounter: Payer: Self-pay | Admitting: Cardiology

## 2017-09-07 VITALS — BP 119/73 | HR 82 | Ht 70.0 in | Wt 180.6 lb

## 2017-09-07 DIAGNOSIS — I25119 Atherosclerotic heart disease of native coronary artery with unspecified angina pectoris: Secondary | ICD-10-CM

## 2017-09-07 DIAGNOSIS — I309 Acute pericarditis, unspecified: Secondary | ICD-10-CM

## 2017-09-07 DIAGNOSIS — R072 Precordial pain: Secondary | ICD-10-CM

## 2017-09-07 DIAGNOSIS — I2510 Atherosclerotic heart disease of native coronary artery without angina pectoris: Secondary | ICD-10-CM

## 2017-09-07 DIAGNOSIS — E782 Mixed hyperlipidemia: Secondary | ICD-10-CM

## 2017-09-07 MED ORDER — IBUPROFEN 400 MG PO TABS: 400.0000 mg | ORAL_TABLET | Freq: Two times a day (BID) | ORAL | 0 refills | Status: DC

## 2017-09-07 MED ORDER — COLCRYS 0.6 MG PO TABS: 0.6000 mg | ORAL_TABLET | Freq: Two times a day (BID) | ORAL | 0 refills | Status: DC

## 2017-09-07 MED ORDER — METOPROLOL TARTRATE 50 MG PO TABS: 50.0000 mg | ORAL_TABLET | Freq: Once | ORAL | 0 refills | Status: DC

## 2017-09-07 NOTE — Telephone Encounter (Signed)
Called pt left a message to call the office regarding some changes to his appointment, requested pt for a call back

## 2017-09-07 NOTE — Progress Notes (Signed)
Patient ID: Jake [name], male   DOB: 1961/04/08, 57 y.o.   MRN: [id number]    Patient Name: Jake [name] Date of Encounter: 09/07/2017  Primary Care Provider:  Dorena Cookey, MD Primary Cardiologist:  Ena Dawley  Problem List   Past Medical History:  Diagnosis Date  . Allergy   . Arthritis   . Asthma   . GERD (gastroesophageal reflux disease)   . Hyperlipidemia   . Sleep apnea    pt denies   Past Surgical History:  Procedure Laterality Date  . INGUINAL HERNIA REPAIR  2009  . LUMBAR LAMINECTOMY  1997  . NECK SURGERY  01/2010   c5-7  . VASECTOMY      Allergies  Allergies  Allergen Reactions  . Other Rash    Wool pants caused rash   . Latex     REACTION: RASH  . Lipitor [Atorvastatin]     Muscle aches at 20 mg    . Zocor [Simvastatin]     Muscle aches at 20 mg daily    HPI  Jake [name] is a 57 y.o. male who in the last two weeks presented to the ER twice for episodes of chest pain tthat are retrosternal, sharp, lasting 5-10 seconds and are sometimes associated with exertion. There is no clear exacerbating or alleviating factor. It started when the patient had asthma flare up, but it has been treated and his pain continue to happen on daily basis.  The pain is associated with SOB, headaches and radiates to the neck and left arm. No relief with change of position.  He states that while driving to [place] 2 weeks ago he had a brief moment where he thought he was going to experience a syncopal episode. He is very anxious about it.  The patient denies association with food intake. He denies hypertension, hyperlipidemia, diabetes or family h/o CAD, he quit smoking 20 years ago.  Today he is coming after two weeks. He complains of necks tingling and pain as well as of intermittent pain at the bilateral temporal area. He is seeing a neurologist that is planning to perform brain MRI and transcranial Doppler.  11/27/2013 - no typical chest pain, the patient is  going through a lot ofd stress, he has a dying father, who was placed at hospice and is experiencing occasional non-exertional chest pressure. He was taken off Lipitor and simvastatin for significant muscle pain. He was restarted on Crestor 5 mg daily. He is LFTs were always normal however his CPK was not checked. He's also complaining of is a on and off pain in his left neck and left axilla pain as well as tingling in his left arm. He is a former smoker quit 21 years ago but has no cough right now.  03/31/2017 - 6 months follow up, feels great, he gets occasional non-exertional chest pains, he has no SOB, stays active. No palpitations or syncope. His complains are skin lesions from Repatha and asking where else other than belly can he inject it. Also polyarthralgia with pains in different jonts.   09/07/2017, the patient presented to the ER on 09/05/2017 with overall fatigue, retrosternal chest pain that was constant for about 48 hours and still ongoing, not associated with dyspnea on exertion, er patient there was a fluttering of her heart, EKG was completely normal, troponin was negative 2, labs were normal with normal hemoglobin, no leukocytosis normal electrolytes and kidney function. He reports that he has been having this pain  never since, there is some positional component, he gets some relief and laying on the left side, sitting upright or laying down or exertion doesn't make it better or worse. He also complains of dizziness while driving and when standing up. No falls. No recent fever or chills no upper respiratory infection.   Home Medications  Prior to Admission medications   Medication Sig Start Date End Date Taking? Authorizing Provider  acetaminophen (TYLENOL) 325 MG tablet Take 650 mg by mouth every 6 (six) hours as needed.      Historical Provider, MD  albuterol (VENTOLIN HFA) 108 (90 BASE) MCG/ACT inhaler Inhale 2 puffs into the lungs as needed. 04/12/13   Wardell Honour, MD    beclomethasone (BECONASE AQ) 42 MCG/SPRAY nasal spray Place 2 sprays into both nostrils 2 (two) times daily. Dose is for each nostril. 04/13/13   Dorena Cookey, MD  fluticasone (FLOVENT HFA) 44 MCG/ACT inhaler Inhale 1 puff into the lungs 2 (two) times daily. 04/12/13   Wardell Honour, MD  HYDROcodone-acetaminophen (VICODIN) 5-500 MG per tablet Take 5-500 mg by mouth as needed. 01/28/12   Historical Provider, MD  ibuprofen (ADVIL,MOTRIN) 400 MG tablet Take 400 mg by mouth every 6 (six) hours as needed.      Historical Provider, MD  loratadine (CLARITIN) 10 MG tablet Take 10 mg by mouth daily.    Historical Provider, MD  ranitidine (ZANTAC) 150 MG capsule Take 150 mg by mouth 2 (two) times daily.      Historical Provider, MD    Family History  Family History  Problem Relation Age of Onset  . Osteoporosis Mother   . Parkinson's disease Father   . Cancer Father   . Alcohol abuse Unknown   . Asthma Unknown   . Depression Unknown   . Valvular heart disease Unknown   . Colon cancer Paternal Grandmother 33    Social History  Social History   Socioeconomic History  . Marital status: Married    Spouse name: [name]  . Number of children: 4  . Years of education: BA  . Highest education level: Not on file  Occupational History  . 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 Financial resource strain: Not on file  . Food insecurity:    Worry: Not on file    Inability: Not on file  . Transportation needs:    Medical: Not on file    Non-medical: Not on file  Tobacco Use  . Smoking status: Former Smoker    Packs/day: 0.50    Years: 20.00    Pack years: 10.00    Types: Cigarettes    Last attempt to quit: 08/19/1991    Years since quitting: 26.0  . Smokeless tobacco: Never Used  Substance and Sexual Activity  . Alcohol use: Yes    Alcohol/week: 6.0 oz    Types: 10 Standard drinks or equivalent per week  . Drug use: No  . Sexual activity: Not on file  Lifestyle  .  Physical activity:    Days per week: Not on file    Minutes per session: Not on file  . Stress: Not on file  Relationships  . Social connections:    Talks on phone: Not on file    Gets together: Not on file    Attends religious service: Not on file    Active member of club or organization: Not on file    Attends meetings of clubs or organizations: Not on file  Relationship status: Not on file  . Intimate partner violence:    Fear of current or ex partner: Not on file    Emotionally abused: Not on file    Physically abused: Not on file    Forced sexual activity: Not on file  Other Topics Concern  . Not on file  Social History Narrative   Patient is married [name]) and has four children.   Patient lives at home with his wife and children.   Patient is working  At [employer].   Patient has a BA degree.   Patient used both hands.   Patient drinks three cups of coffee daily.     Review of Systems, as per HPI, otherwise negative General:  No chills, fever, night sweats or weight changes.  Cardiovascular:  No chest pain, dyspnea on exertion, edema, orthopnea, palpitations, paroxysmal nocturnal dyspnea. Dermatological: No rash, lesions/masses Respiratory: No cough, dyspnea Urologic: No hematuria, dysuria Abdominal:   No nausea, vomiting, diarrhea, bright red blood per rectum, melena, or hematemesis Neurologic:  No visual changes, wkns, changes in mental status. All other systems reviewed and are otherwise negative except as noted above.  Physical Exam  BP 119/73 mmHg, HR 82 BPM General: Pleasant, NAD Psych: Normal affect. Neuro: Alert and oriented X 3. Moves all extremities spontaneously. HEENT: Normal  Neck: Supple without bruits or JVD. Lungs:  Resp regular and unlabored, CTA. Heart: RRR no s3, s4, or murmurs. Abdomen: Soft, non-tender, non-distended, BS + x 4.  Extremities: No clubbing, cyanosis or edema. DP/PT/Radials 2+ and equal bilaterally.  Labs:  No  results for input(s): CKTOTAL, CKMB, TROPONINI in the last 72 hours. Lab Results  Component Value Date   WBC 6.8 09/05/2017   HGB 15.1 09/05/2017   HCT 44.4 09/05/2017   MCV 91.4 09/05/2017   PLT 254 09/05/2017    Recent Labs  Lab 09/05/17 1315  NA 141  K 3.7  CL 104  CO2 26  BUN 8  CREATININE 0.94  CALCIUM 9.5  GLUCOSE 106*    Lipid Panel     Component Value Date/Time   CHOL 103 03/31/2017 0902   CHOL 101 (L) 12/24/2015 0750   CHOL 166 10/02/2014 0805   TRIG 76 03/31/2017 0902   TRIG 77 12/24/2015 0750   TRIG 97 10/02/2014 0805   HDL 62 03/31/2017 0902   HDL 66 12/24/2015 0750   HDL 50 10/02/2014 0805   CHOLHDL 1.7 03/31/2017 0902   CHOLHDL 1.5 12/24/2015 0750   CHOLHDL 5 05/08/2013 0754   VLDL 27.2 05/08/2013 0754   LDLCALC 26 03/31/2017 0902   LDLCALC 20 12/24/2015 0750   LDLCALC 97 10/02/2014 0805   Accessory Clinical Findings  Echocardiogram - none  ECG - sinus bradycardia, 53 beats per minutes, normal EKG.   Coronary CT 04/26/2013 Left main is a large vessel with minimal non-calcified plague with 0-25 % stenosis. Left main gives rise to LAD, ramus intermedium and LCx artery.  LAD is a large vessel with a mid plague in its ostial portion with associated 25-50% stenosis. There are 2 other calcified plagues in the proximal LAD with associated 25-50% stenosis. LAD only has 0-25% stenosis in its mid and distal segment. First diagonal vessel is rather small and only has mild luminal irregularities.  RI is moderate caliber vessel that further divides into two vessels and gives rise to the first septal branch. These two ramuses supply the entire anterolateral wall and have only mild luminal irregularities.  LCx is  a small to moderate caliber non-dominant vessel that gives rise to one very small obtuse marginal branch. There is mild non-calcified plague in the proximal LCx artery associated with 0-25% stenosis.  RCA is a large dominant vessel that  gives rise to PDA and PLVB. RCA has a mild calcified plague that is associated with 0-25% stenosis. The rest of RCA has only luminal irregularities. PDA is a large vessel that runs all the way to the apex. There are only luminal irregularities. PLVB is a small vessel without significant stenosis.  Pericardium: Normal  IMPRESSION: 1. Coronary calcium score of 39.82. This was 46 percentile for age and sex matched control.  2. Non-obstructive CAD. Intense medical management is recommended.  Ena Dawley  EKG performed today 11/04/2015 shows sinus rhythm with first-degree AV block otherwise normal EKG and unchanged from prior.    Assessment & Plan  1. 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 We will repeat calcium score, coronary CTA and CT FFR to re-evaluate progress of CAD.  2. Possible acute pericarditis, normal ECG, no rub and atypical symptoms, however responded to PO prednisone, I will start ibuprofen 400 mg po BID x 4 days and colchicine 0.6 mg PO BID x 3 months.  3. HLP - as above, with evidence of nonobstructive CAD and significant family history of premature coronary artery disease. We will discuss injections sites with pharmacy.  4. Neck and temporal pain, carotid US was normal B/L. Resolved.  Follow up in 3 weeks.  Ena Dawley, MD, [facility] 09/07/2017, 11:36 AM

## 2017-09-07 NOTE — Patient Instructions (Signed)
Medication Instructions:   START TAKING COLCRYS 0.6 MG BY MOUTH TWICE DAILY FOR 3 MONTHS ONLY  START TAKING IBUPROFEN 400 MG TWICE DAILY FOR 5 DAYS ONLY    Testing/Procedures:   CORONARY CT Please arrive at the [facility] main entrance of [facility] at xx:xx AM (30-45 minutes prior to test start time)  [facility] [place], [place] [address] [phone]  Proceed to the [facility] Radiology Department (First Floor).  Please follow these instructions carefully (unless otherwise directed):    On the Night Before the Test: . Drink plenty of water. . Do not consume any caffeinated/decaffeinated beverages or chocolate 12 hours prior to your test. . Do not take any antihistamines 12 hours prior to your test.   On the Day of the Test: . Drink plenty of water. Do not drink any water within one hour of the test. . Do not eat any food 4 hours prior to the test. . You may take your regular medications prior to the test. . IF NOT ON A BETA BLOCKER - Take 50 mg of lopressor (metoprolol) one hour before the test.   After the Test: . Drink plenty of water. . After receiving IV contrast, you may experience a mild flushed feeling. This is normal. . On occasion, you may experience a mild rash up to 24 hours after the test. This is not dangerous. If this occurs, you can take Benadryl 25 mg and increase your fluid intake. . If you experience trouble breathing, this can be serious. If it is severe call 911 IMMEDIATELY. If it is mild, please call our office.      Follow-Up:  3 WEEKS WITH AN EXTENDER IN OUR OFFICE       If you need a refill on your cardiac medications before your next appointment, please call your pharmacy.

## 2017-09-08 ENCOUNTER — Ambulatory Visit: Payer: BLUE CROSS/BLUE SHIELD | Admitting: Family Medicine

## 2017-09-10 ENCOUNTER — Other Ambulatory Visit: Payer: Self-pay

## 2017-09-10 ENCOUNTER — Telehealth: Payer: Self-pay

## 2017-09-10 MED ORDER — MITIGARE 0.6 MG PO CAPS: ORAL_CAPSULE | ORAL | 3 refills | Status: DC

## 2017-09-10 NOTE — Telephone Encounter (Signed)
Mariann Laster from [employer] states that Dellroy is not on the pts plan formulary. She is recommending that we order it as brand name Mitigare 0.6 mg as it is covered by the plan.  I have changed [place] to [place] in the pts chart and sent Mitigare RX to [facility] to fill.

## 2017-09-10 NOTE — Telephone Encounter (Signed)
**Note De-Identified Jake [name] Obfuscation** I have done a Colcrys PA through covermymeds.

## 2017-09-13 ENCOUNTER — Ambulatory Visit (INDEPENDENT_AMBULATORY_CARE_PROVIDER_SITE_OTHER): Payer: BLUE CROSS/BLUE SHIELD | Admitting: Family Medicine

## 2017-09-13 ENCOUNTER — Encounter: Payer: Self-pay | Admitting: Family Medicine

## 2017-09-13 VITALS — BP 122/82 | HR 78 | Temp 98.7°F | Wt 183.0 lb

## 2017-09-13 DIAGNOSIS — Z0000 Encounter for general adult medical examination without abnormal findings: Secondary | ICD-10-CM

## 2017-09-13 DIAGNOSIS — J309 Allergic rhinitis, unspecified: Secondary | ICD-10-CM | POA: Diagnosis not present

## 2017-09-13 DIAGNOSIS — E7801 Familial hypercholesterolemia: Secondary | ICD-10-CM

## 2017-09-13 MED ORDER — ALBUTEROL SULFATE HFA 108 (90 BASE) MCG/ACT IN AERS: 2.0000 | INHALATION_SPRAY | Freq: Four times a day (QID) | RESPIRATORY_TRACT | 1 refills | Status: DC | PRN

## 2017-09-13 NOTE — Patient Instructions (Addendum)
We will check a PSA today and call you if there is anything abnormal  All the other lab work due had the hospital was normal  Continue current medications via cardiology  Albuterol,,,,,,,,,,, 1-2 puffs 3 times a day when necessary for wheezing  Tetanus booster January 2020

## 2017-09-13 NOTE — Progress Notes (Signed)
Jake [name] is a 57 year old married male nonsmoker........ Son of Dr. Warden Fillers..... Who comes in today to review his medical history  He was recently hospitalized with chest pain. He has a history of underlying hyperlipidemia. Years ago we tried 1 different statins because of the hyperlipidemia. None of which he could tolerate.  He then developed symptoms of coronary disease had a complete evaluation of a cardiology and for the past 2 years has been on repatha and Zetia via cardiology. A recent hospitalization a complete evaluation for chest pain. Felt to have mild pericarditis. His coronary lesions were not significant he ruled out MI. He was treated with colchicine 0.6 daily along with Motrin 400 mg twice a day  He's had a history of asthma in the past. He was on inhaled steroid however his asthma symptoms have abated. Last year he used a little bit of the rescue inhaler when necessary. He brings in inhaler. Original dosage is 200 doses is now down to 196. Therefore no need for the chronic steroid inhaler.  He uses Flonase for allergic rhinitis  Vaccinations up-to-date do a tetanus booster January 2020. Last tetanus booster January 2010.  Lab was done except for PSA.  BP 122/82 (BP Location: Left Arm, Patient Position: Sitting, Cuff Size: Normal)   Pulse 78   Temp 98.7 F (37.1 C) (Oral)   Wt 183 lb (83 kg)   BMI 26.26 kg/m  Well-developed well-nourished male no acute distress vital signs stable he is afebrile  A complete physical exam and labs done in the hospital therefore not repeated  #1 coronary disease...Marland KitchenMarland KitchenMarland Kitchen Continue current medications follow-up in cardiology as outlined  #2 history of allergic rhinitis........ Continue steroid nasal spray  #3 history of asthma....... Now asymptomatic off steroid inhaler......Marland Kitchen Albuterol when necessary

## 2017-09-14 LAB — PSA: PSA: 0.63 ng/mL (ref 0.10–4.00)

## 2017-09-21 DIAGNOSIS — H9312 Tinnitus, left ear: Secondary | ICD-10-CM | POA: Diagnosis not present

## 2017-09-21 DIAGNOSIS — H903 Sensorineural hearing loss, bilateral: Secondary | ICD-10-CM | POA: Diagnosis not present

## 2017-09-27 ENCOUNTER — Ambulatory Visit (INDEPENDENT_AMBULATORY_CARE_PROVIDER_SITE_OTHER): Payer: BLUE CROSS/BLUE SHIELD | Admitting: Family Medicine

## 2017-09-27 ENCOUNTER — Encounter: Payer: Self-pay | Admitting: Family Medicine

## 2017-09-27 VITALS — BP 110/78 | HR 84 | Temp 98.3°F | Wt 186.8 lb

## 2017-09-27 DIAGNOSIS — J0100 Acute maxillary sinusitis, unspecified: Secondary | ICD-10-CM

## 2017-09-27 MED ORDER — AMOXICILLIN-POT CLAVULANATE 875-125 MG PO TABS: 1.0000 | ORAL_TABLET | Freq: Two times a day (BID) | ORAL | 0 refills | Status: DC

## 2017-09-27 NOTE — Patient Instructions (Signed)

## 2017-09-27 NOTE — Progress Notes (Signed)
  Subjective:     Patient ID: Jake [name], male   DOB: 1960/06/28, 57 y.o.   MRN: [id number]  HPI Patient seen for acute visit with approximately one-week history of progressive sinus congestive symptoms. He thinks a lot of this started with pollen allergies. He is very sensitive to pollen. He already takes [employer] account planner and has been using saline nasal irrigation. He has now developed some left maxillary facial pain and greenish nasal discharge. He feels things are settling his chest with now productive cough. No fever. Increased fatigue. No relief with over-the-counter medications. Patient is nonsmoker  Past Medical History:  Diagnosis Date  . Allergy   . Arthritis   . Asthma   . GERD (gastroesophageal reflux disease)   . Hyperlipidemia   . Sleep apnea    pt denies   Past Surgical History:  Procedure Laterality Date  . INGUINAL HERNIA REPAIR  2009  . LUMBAR LAMINECTOMY  1997  . NECK SURGERY  01/2010   c5-7  . VASECTOMY      reports that he quit smoking about 26 years ago. His smoking use included cigarettes. He has a 10.00 pack-year smoking history. He has never used smokeless tobacco. He reports that he drinks about 6.0 oz of alcohol per week. He reports that he does not use drugs. family history includes Alcohol abuse in his unknown relative; Asthma in his unknown relative; Cancer in his father; Colon cancer (age of onset: 5) in his paternal grandmother; Depression in his unknown relative; Osteoporosis in his mother; Parkinson's disease in his father; Valvular heart disease in his unknown relative. Allergies  Allergen Reactions  . Other Rash    Wool pants caused rash   . Latex     REACTION: RASH  . Lipitor [Atorvastatin]     Muscle aches at 20 mg    . Zocor [Simvastatin]     Muscle aches at 20 mg daily     Review of Systems  Constitutional: Negative for chills and fever.  HENT: Positive for congestion, sinus pressure and sinus pain. Negative for sore throat.    Respiratory: Positive for cough. Negative for shortness of breath and wheezing.        Objective:   Physical Exam  Constitutional: He appears well-developed and well-nourished.  HENT:  Right Ear: External ear normal.  Left Ear: External ear normal.  Neck: Neck supple.  Cardiovascular: Normal rate and regular rhythm.  Pulmonary/Chest: Effort normal and breath sounds normal. He has no wheezes. He has no rales.  Lymphadenopathy:    He has no cervical adenopathy.       Assessment:     probable acute left maxillary sinusitis    Plan:     -Augmentin 875 mg twice daily for 10 days -Continue saline nasal irrigation -Follow-up for any fever or worsening symptoms  Eulas Post MD [facility]

## 2017-10-05 ENCOUNTER — Ambulatory Visit: Payer: BLUE CROSS/BLUE SHIELD | Admitting: Physician Assistant

## 2017-10-07 ENCOUNTER — Telehealth: Payer: Self-pay | Admitting: Pharmacist

## 2017-10-07 NOTE — Telephone Encounter (Signed)
[facility] called for verbal RX as pt reported faulty device and will need rx to replace medication at no charge to patient. V/O given to replace faulty device.

## 2017-10-12 ENCOUNTER — Telehealth: Payer: Self-pay | Admitting: Family Medicine

## 2017-10-12 NOTE — Telephone Encounter (Signed)
Copied from [place] [id number]. Topic: Quick Communication - Rx Refill/Question >> Oct 12, 2017 10:47 AM Bea Graff, NT wrote: Medication: benzonatate (TESSALON) 100 MG capsule Has the patient contacted their pharmacy? Yes.   (Agent: If no, request that the patient contact the pharmacy for the refill.) Preferred Pharmacy (with phone number or street name): [place], [place]. [phone] (Phone) [phone] (Fax)     Agent: Please be advised that RX refills may take up to 3 business days. We ask that you follow-up with your pharmacy.

## 2017-10-13 ENCOUNTER — Other Ambulatory Visit: Payer: Self-pay | Admitting: Family Medicine

## 2017-10-13 DIAGNOSIS — R05 Cough: Secondary | ICD-10-CM

## 2017-10-13 DIAGNOSIS — R059 Cough, unspecified: Secondary | ICD-10-CM

## 2017-10-13 NOTE — Telephone Encounter (Signed)
Pt was seen on 09/27/17 by Dr. Elease Hashimoto. He is hoping him or another provider would consider calling this in for him.

## 2017-10-13 NOTE — Telephone Encounter (Signed)
Pt calling checking on refill request. His asthma is really acting up and the cough is keeping him up at night.

## 2017-10-14 NOTE — Telephone Encounter (Addendum)
Patient is checking on the status of the refill.  He said he can't go through another night coughing through the entire night.  Please call him at [phone].

## 2017-10-14 NOTE — Telephone Encounter (Signed)
Patient called to check on status of request. He says this is the 2nd request and he can't go through another night coughing.  Tessalon refill Last OV:09/27/17 Last refill:08/03/16 [id number] Pharmacy: [place], [place] [phone] (Phone) [phone] (Fax)

## 2017-10-15 ENCOUNTER — Other Ambulatory Visit: Payer: Self-pay | Admitting: Family Medicine

## 2017-10-15 MED ORDER — BENZONATATE 100 MG PO CAPS: ORAL_CAPSULE | ORAL | 0 refills | Status: DC

## 2017-10-15 NOTE — Telephone Encounter (Signed)
This request has already been sent to Dr Elease Hashimoto and is waiting for his approval since Dr Sherren Mocha is not at the office.

## 2017-10-15 NOTE — Telephone Encounter (Signed)
Rx has been sent to your pharmacy for refill per Dr Elease Hashimoto

## 2017-10-15 NOTE — Telephone Encounter (Signed)
This is  Dr Sherren Mocha pt, you saw pt on 09/27/2017 for Sinusitis. Pt is now requesting for Tessalon pearls for cough and Dr Sherren Mocha will not be back to the office until Tuesday, please Advise.

## 2017-10-15 NOTE — Telephone Encounter (Signed)
May refill Tessalon Perles 100 mg 1 every 8 hours as needed for cough #30 with no refill

## 2017-10-19 ENCOUNTER — Ambulatory Visit (HOSPITAL_COMMUNITY)
Admission: RE | Admit: 2017-10-19 | Discharge: 2017-10-19 | Disposition: A | Discharge status: Home / Self Care | Payer: BLUE CROSS/BLUE SHIELD | Source: Ambulatory Visit | Attending: Cardiology | Admitting: Cardiology

## 2017-10-19 DIAGNOSIS — I25119 Atherosclerotic heart disease of native coronary artery with unspecified angina pectoris: Secondary | ICD-10-CM

## 2017-10-19 DIAGNOSIS — I309 Acute pericarditis, unspecified: Secondary | ICD-10-CM

## 2017-10-19 DIAGNOSIS — R072 Precordial pain: Secondary | ICD-10-CM | POA: Diagnosis not present

## 2017-10-19 DIAGNOSIS — R918 Other nonspecific abnormal finding of lung field: Secondary | ICD-10-CM | POA: Diagnosis not present

## 2017-10-19 DIAGNOSIS — I2510 Atherosclerotic heart disease of native coronary artery without angina pectoris: Secondary | ICD-10-CM | POA: Diagnosis not present

## 2017-10-19 MED ORDER — IOPAMIDOL (ISOVUE-370) INJECTION 76%
100.0000 mL | Freq: Once | INTRAVENOUS | Status: AC | PRN
  Administered 2017-10-19: 80 mL via INTRAVENOUS

## 2017-10-19 MED ORDER — IOPAMIDOL (ISOVUE-370) INJECTION 76%
INTRAVENOUS | Status: AC
  Filled 2017-10-19: qty 100

## 2017-10-19 MED ORDER — NITROGLYCERIN 0.4 MG SL SUBL
0.8000 mg | SUBLINGUAL_TABLET | SUBLINGUAL | Status: DC | PRN
Start: 2017-10-19 — End: 2017-10-20
  Administered 2017-10-19: 0.8 mg via SUBLINGUAL

## 2017-10-19 MED ORDER — NITROGLYCERIN 0.4 MG SL SUBL
SUBLINGUAL_TABLET | SUBLINGUAL | Status: AC
  Filled 2017-10-19: qty 2

## 2017-10-19 NOTE — Telephone Encounter (Signed)
Rx was approved by Dr Elease Hashimoto and was sent to pt pharmacy for refill.

## 2017-10-28 DIAGNOSIS — H9312 Tinnitus, left ear: Secondary | ICD-10-CM | POA: Diagnosis not present

## 2017-10-28 DIAGNOSIS — H903 Sensorineural hearing loss, bilateral: Secondary | ICD-10-CM | POA: Diagnosis not present

## 2017-10-31 ENCOUNTER — Encounter: Payer: Self-pay | Admitting: Physician Assistant

## 2017-10-31 NOTE — Progress Notes (Signed)
Cardiology Office Note    Date:  11/02/2017  ID:  Jake [name], DOB [birth year], MRN [id number] PCP:  Dorena Cookey, MD  Cardiologist:  Ena Dawley, MD   Chief Complaint: f/u chest pain  History of Present Illness:  Jake [name] is a 57 y.o. male with history of nonobstructive CAD by cath 2014, first degree AV block, sinus bradycardia, asthma, arthritis, GERD, HLD who presents for cardiac f/u of chest pain. Remote stress echo 2010 wnl, EF 55%. He established care with Dr. Meda Coffee in 2014 for chest pain with mixed typical/atypical features. Cardiac CTA 04/2013 showed nonobstructive CAD and medical therapy was recommended. He has been followed for lipid control since that time particularly with our pharmD due to h/o statin intolerance. He was seen in the ER 08/2017 for recurrent chest pain and ruled out for MI without significant abnormality found at that time (retrosternal chest pain constant for 48 hours+). He saw Dr. Meda Coffee back who felt he might have acute pericarditis and recommended ibuprofen 400mg  BID x 4 days and colchicine 0.6mg  po BID. There was possible prior improvement in prednisone. Last labs: 08/2017 CBC wnl, K 3.7, Cr 0.94, 03/2017 TSH wnl, LFTs ok, LDL 26. Cardiac CTA 10/19/17 showed still nonobstructive CAD and was sent for FFR which confirmed no hemodynamically significant stenoses. Previously mentioned pulmonary nodules appeared stable, felt most likely benign, no f/u needed if low risk but could consider f/u 12 months.  He returns for follow-up overall feeling well. His chest discomfort improved with measures to improve his asthma such as nebulizer treatment and using his albuterol inhaler. He also has used the ibuprofen and colchicine with improvement. He is exercising regularly without complication except he does notice increased coughing while exercising. He does tend to notice a sensation of increased awareness in his chest/chest pressure after environmental exposures like  pollen. He is not wheezing. He also sees dermatology for eczema. We discussed CT results in depth today. He denies any recent heartburn, reports this is diet-controlled.   Past Medical History:  Diagnosis Date  . Allergy   . Arthritis   . Asthma   . CAD in native artery    a. nonobstructive by cardiac CTA.  Marland Kitchen GERD (gastroesophageal reflux disease)   . Hyperlipidemia   . Pulmonary nodules     Past Surgical History:  Procedure Laterality Date  . INGUINAL HERNIA REPAIR  2009  . LUMBAR LAMINECTOMY  1997  . NECK SURGERY  01/2010   c5-7  . VASECTOMY      Current Medications: Current Meds  Medication Sig  . albuterol (PROVENTIL HFA;VENTOLIN HFA) 108 (90 Base) MCG/ACT inhaler Inhale 2 puffs into the lungs every 6 (six) hours as needed for wheezing or shortness of breath (cough, shortness of breath or wheezing.).  Marland Kitchen amoxicillin-clavulanate (AUGMENTIN) 875-125 MG tablet Take 1 tablet by mouth 2 (two) times daily.  . benzonatate (TESSALON) 100 MG capsule Take I cap every 8 hrs as needed for cough  . ezetimibe (ZETIA) 10 MG tablet Take 10 mg by mouth daily.  . fluticasone (FLONASE) 50 MCG/ACT nasal spray Place 2 sprays into both nostrils daily.  Marland Kitchen MITIGARE 0.6 MG CAPS Take 1 capsule PO twice daily for 3 months  . [place] 420 MG/3.5ML SOCT ADMINISTER 420 MG SUBCUTANEOUSLY ONCE EVERY 30 DAYS   Current Facility-Administered Medications for the 11/02/17 encounter (Office Visit) with Charlie Pitter, PA-C  Medication  . 0.9 %  sodium chloride infusion  Allergies:   Other; Latex; Lipitor [atorvastatin]; and Zocor [simvastatin]   Social History   Socioeconomic History  . Marital status: Married    Spouse name: [name]  . Number of children: 4  . Years of education: BA  . Highest education level: Not on file  Occupational History  . 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 Financial resource strain: Not on file  . Food insecurity:    Worry: Not on file      Inability: Not on file  . Transportation needs:    Medical: Not on file    Non-medical: Not on file  Tobacco Use  . Smoking status: Former Smoker    Packs/day: 0.50    Years: 20.00    Pack years: 10.00    Types: Cigarettes    Last attempt to quit: 08/19/1991    Years since quitting: 26.2  . Smokeless tobacco: Never Used  Substance and Sexual Activity  . Alcohol use: Yes    Alcohol/week: 6.0 oz    Types: 10 Standard drinks or equivalent per week  . Drug use: No  . Sexual activity: Not on file  Lifestyle  . Physical activity:    Days per week: Not on file    Minutes per session: Not on file  . Stress: Not on file  Relationships  . Social connections:    Talks on phone: Not on file    Gets together: Not on file    Attends religious service: Not on file    Active member of club or organization: Not on file    Attends meetings of clubs or organizations: Not on file    Relationship status: Not on file  Other Topics Concern  . Not on file  Social History Narrative   Patient is married [name]) and has four children.   Patient lives at home with his wife and children.   Patient is working  At [employer].   Patient has a BA degree.   Patient used both hands.   Patient drinks three cups of coffee daily.     Family History:  The patient's family history includes Alcohol abuse in his unknown relative; Asthma in his unknown relative; Cancer in his father; Colon cancer (age of onset: 32) in his paternal grandmother; Depression in his unknown relative; Osteoporosis in his mother; Parkinson's disease in his father; Valvular heart disease in his unknown relative.  ROS:   Please see the history of present illness.  All other systems are reviewed and otherwise negative.    PHYSICAL EXAM:   VS:  BP 128/82   Pulse 81   Ht 5\' 10"  (1.778 m)   Wt 185 lb 6.4 oz (84.1 kg)   SpO2 97%   BMI 26.60 kg/m   BMI: Body mass index is 26.6 kg/m. GEN: Well nourished, well  developed WM, in no acute distress HEENT: normocephalic, atraumatic Neck: no JVD, carotid bruits, or masses Cardiac: RRR; no murmurs, rubs, or gallops, no edema  Respiratory:  clear to auscultation bilaterally, normal work of breathing GI: soft, nontender, nondistended, + BS MS: no deformity or atrophy Skin: warm and dry, no diffuse rash - eczema on thumb Neuro:  Alert and Oriented x 3, Strength and sensation are intact, follows commands Psych: euthymic mood, full affect  Wt Readings from Last 3 Encounters:  11/02/17 185 lb 6.4 oz (84.1 kg)  09/27/17 186 lb 12.8 oz (84.7 kg)  09/13/17 183 lb (83 kg)  Studies/Labs Reviewed:   EKG:  EKG was not ordered today.  Recent Labs: 03/31/2017: ALT 23; TSH 2.380 09/05/2017: BUN 8; Creatinine, Ser 0.94; Hemoglobin 15.1; Platelets 254; Potassium 3.7; Sodium 141   Lipid Panel    Component Value Date/Time   CHOL 103 03/31/2017 0902   CHOL 101 (L) 12/24/2015 0750   CHOL 166 10/02/2014 0805   TRIG 76 03/31/2017 0902   TRIG 77 12/24/2015 0750   TRIG 97 10/02/2014 0805   HDL 62 03/31/2017 0902   HDL 66 12/24/2015 0750   HDL 50 10/02/2014 0805   CHOLHDL 1.7 03/31/2017 0902   CHOLHDL 1.5 12/24/2015 0750   CHOLHDL 5 05/08/2013 0754   VLDL 27.2 05/08/2013 0754   LDLCALC 26 03/31/2017 0902   LDLCALC 20 12/24/2015 0750   LDLCALC 97 10/02/2014 0805   LDLDIRECT 135.0 05/08/2013 0754    Additional studies/ records that were reviewed today include: Summarized above    ASSESSMENT & PLAN:   1. Chest pain, atypical - Dr. Meda Coffee felt this may have represented pericarditis in 08/2017. The diagnosis not entirely clear, but some features are suspicious for possible bronchospasm from asthma as well. The fact that he previously improved with nebs and prednisone would support this. He would likely benefit from preventative regimen for his chronic asthma. He has h/o eczema particularly on his thumb and follows with derm. I will place referral to  pulmonology and also asked him to touch base with PCP in the interim to discuss possible prophylactic therapy. He may benefit from an inhaled corticosteroid regimen during the high pollen season. Will continue colchicine through the 33-month window that was recommended by Dr. Meda Coffee for possible pericarditis. 2. CAD - reviewed CT results today in clinic, continue BP and lipid control. 3. Hyperlipidemia - he is maintained on PCSK9. Per recent CT result Dr. Meda Coffee recommended to continue same lipid regimen. 4. Asthma - as discussed above, refer to pulmonology.  Disposition: F/u with Dr. Meda Coffee in 6 months.  Medication Adjustments/Labs and Tests Ordered: Current medicines are reviewed at length with the patient today.  Concerns regarding medicines are outlined above. Medication changes, Labs and Tests ordered today are summarized above and listed in the Patient Instructions accessible in Encounters.   Signed, Charlie Pitter, PA-C  11/02/2017 10:08 AM    [address], [place], [place]  [address] Phone: [phone]; Fax: [phone]

## 2017-11-02 ENCOUNTER — Ambulatory Visit (INDEPENDENT_AMBULATORY_CARE_PROVIDER_SITE_OTHER): Payer: BLUE CROSS/BLUE SHIELD | Admitting: Physician Assistant

## 2017-11-02 ENCOUNTER — Encounter: Payer: Self-pay | Admitting: Physician Assistant

## 2017-11-02 ENCOUNTER — Other Ambulatory Visit: Payer: Self-pay | Admitting: Otolaryngology

## 2017-11-02 VITALS — BP 128/82 | HR 81 | Ht 70.0 in | Wt 185.4 lb

## 2017-11-02 DIAGNOSIS — R0789 Other chest pain: Secondary | ICD-10-CM | POA: Diagnosis not present

## 2017-11-02 DIAGNOSIS — E785 Hyperlipidemia, unspecified: Secondary | ICD-10-CM

## 2017-11-02 DIAGNOSIS — I2510 Atherosclerotic heart disease of native coronary artery without angina pectoris: Secondary | ICD-10-CM

## 2017-11-02 DIAGNOSIS — J45909 Unspecified asthma, uncomplicated: Secondary | ICD-10-CM | POA: Diagnosis not present

## 2017-11-02 DIAGNOSIS — H903 Sensorineural hearing loss, bilateral: Secondary | ICD-10-CM

## 2017-11-02 DIAGNOSIS — I319 Disease of pericardium, unspecified: Secondary | ICD-10-CM

## 2017-11-02 DIAGNOSIS — H9312 Tinnitus, left ear: Secondary | ICD-10-CM

## 2017-11-02 NOTE — Patient Instructions (Signed)
Medication Instructions:  Your physician has recommended you make the following change in your medication:   CONTINUE Colchicine through July to complete a total of 3 months   Labwork: None Ordered   Testing/Procedures: None Ordered   Follow-Up: You have been referred to [facility] for asthma, pulmonary nodules  Your physician wants you to follow-up in: 6 months with Dr. Meda Coffee. You will receive a reminder letter in the mail two months in advance. If you don't receive a letter, please call our office to schedule the follow-up appointment.   If you need a refill on your cardiac medications before your next appointment, please call your pharmacy.   Thank you for choosing [facility]! Christen Bame, RN [phone]

## 2017-11-15 ENCOUNTER — Ambulatory Visit
Admission: RE | Admit: 2017-11-15 | Discharge: 2017-11-15 | Disposition: A | Discharge status: Home / Self Care | Payer: BLUE CROSS/BLUE SHIELD | Source: Ambulatory Visit | Attending: Otolaryngology | Admitting: Otolaryngology

## 2017-11-15 DIAGNOSIS — H9312 Tinnitus, left ear: Secondary | ICD-10-CM | POA: Diagnosis not present

## 2017-11-15 DIAGNOSIS — H903 Sensorineural hearing loss, bilateral: Secondary | ICD-10-CM

## 2017-11-15 MED ORDER — GADOBENATE DIMEGLUMINE 529 MG/ML IV SOLN
17.0000 mL | Freq: Once | INTRAVENOUS | Status: AC | PRN
  Administered 2017-11-15: 17 mL via INTRAVENOUS

## 2017-11-20 ENCOUNTER — Other Ambulatory Visit: Payer: Self-pay | Admitting: Cardiology

## 2017-12-21 ENCOUNTER — Encounter: Payer: Self-pay | Admitting: Family Medicine

## 2017-12-23 DIAGNOSIS — H903 Sensorineural hearing loss, bilateral: Secondary | ICD-10-CM | POA: Diagnosis not present

## 2017-12-23 DIAGNOSIS — H9312 Tinnitus, left ear: Secondary | ICD-10-CM | POA: Diagnosis not present

## 2018-01-06 ENCOUNTER — Other Ambulatory Visit (INDEPENDENT_AMBULATORY_CARE_PROVIDER_SITE_OTHER): Payer: BLUE CROSS/BLUE SHIELD

## 2018-01-06 ENCOUNTER — Ambulatory Visit (INDEPENDENT_AMBULATORY_CARE_PROVIDER_SITE_OTHER): Payer: BLUE CROSS/BLUE SHIELD | Admitting: Pulmonary Disease

## 2018-01-06 ENCOUNTER — Encounter: Payer: Self-pay | Admitting: Pulmonary Disease

## 2018-01-06 VITALS — BP 122/72 | HR 72 | Ht 70.0 in | Wt 181.6 lb

## 2018-01-06 DIAGNOSIS — J45909 Unspecified asthma, uncomplicated: Secondary | ICD-10-CM

## 2018-01-06 DIAGNOSIS — R911 Solitary pulmonary nodule: Secondary | ICD-10-CM

## 2018-01-06 LAB — CBC WITH DIFFERENTIAL/PLATELET
Basophils Absolute: 0 10*3/uL (ref 0.0–0.1)
Basophils Relative: 0.6 % (ref 0.0–3.0)
Eosinophils Absolute: 0.3 10*3/uL (ref 0.0–0.7)
Eosinophils Relative: 4.8 % (ref 0.0–5.0)
HCT: 45.3 % (ref 39.0–52.0)
Hemoglobin: 15.7 g/dL (ref 13.0–17.0)
Lymphocytes Relative: 34.2 % (ref 12.0–46.0)
Lymphs Abs: 1.8 10*3/uL (ref 0.7–4.0)
MCHC: 34.7 g/dL (ref 30.0–36.0)
MCV: 90.1 fl (ref 78.0–100.0)
Monocytes Absolute: 0.5 10*3/uL (ref 0.1–1.0)
Monocytes Relative: 8.9 % (ref 3.0–12.0)
Neutro Abs: 2.7 10*3/uL (ref 1.4–7.7)
Neutrophils Relative %: 51.5 % (ref 43.0–77.0)
Platelets: 305 10*3/uL (ref 150.0–400.0)
RBC: 5.03 Mil/uL (ref 4.22–5.81)
RDW: 12.5 % (ref 11.5–15.5)
WBC: 5.3 10*3/uL (ref 4.0–10.5)

## 2018-01-06 LAB — NITRIC OXIDE: Nitric Oxide: 24

## 2018-01-06 NOTE — Patient Instructions (Signed)
We will check CBC differential, IgE levels today Schedule you for pulmonary function test  The lung nodules are likely benign.  We will follow-up with a CT chest without contrast in 1 year. Continue to use your inhalers as needed  Follow-up with me in clinic in 1 to 2 months to review test results.

## 2018-01-06 NOTE — Progress Notes (Signed)
Jake [name]    [id number]    07-Jul-1960  Primary Care Physician:Todd, Jory Ee, MD  Referring Physician: Dorena Cookey, MD [place], [place] [address]  Chief complaint: Consult for asthma, lung nodule  HPI: 57 year old with history of atypical angina, asthma, allergies, hyperlipidemia Diagnosed with asthma many years ago.  He has occasional exacerbations in the fall season about every 2 years for which he uses prednisone, inhalers.  He currently has albuterol inhaler which he is not using.  He has used Qvar in the past History also noted for mild eczema and follows with dermatology for this He was evaluated in the ED and by cardiology recently for atypical chest pain.  He had a CT coronaries which showed nonobstructive coronary artery disease.  The CT also showed subcentimeter pulmonary nodules.  Pets: Dogs.  Has chickens outside his house.  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  Exposed to clay dust Exposures: Exposed to clear dust, chicken coop.  He tries to minimize his exposure and wears a mask. Smoking history: 10-pack-year smoker.  Quit in 1994 Travel history: No significant travel Relevant family history: Father was a smoker.  He had lung cancer, COPD  Outpatient Encounter Medications as of 01/06/2018  Medication Sig  . albuterol (PROVENTIL HFA;VENTOLIN HFA) 108 (90 Base) MCG/ACT inhaler Inhale 2 puffs into the lungs every 6 (six) hours as needed for wheezing or shortness of breath (cough, shortness of breath or wheezing.).  Marland Kitchen benzonatate (TESSALON) 100 MG capsule Take I cap every 8 hrs as needed for cough  . ezetimibe (ZETIA) 10 MG tablet Take 10 mg by mouth daily.  . fluticasone (FLONASE) 50 MCG/ACT nasal spray Place 2 sprays into both nostrils daily.  Marland Kitchen [place] 420 MG/3.5ML SOCT ADMINISTER 420 MG  SUBCUTANEOUSLY ONCE EVERY 30 DAYS  . [DISCONTINUED] amoxicillin-clavulanate (AUGMENTIN) 875-125 MG tablet Take 1 tablet by mouth 2 (two) times daily.  . [DISCONTINUED] MITIGARE 0.6 MG CAPS Take 1 capsule PO twice daily for 3 months   Facility-Administered Encounter Medications as of 01/06/2018  Medication  . 0.9 %  sodium chloride infusion    Allergies as of 01/06/2018 - Review Complete 01/06/2018  Allergen Reaction Noted  . Other Rash 08/15/2013  . Latex  03/01/2007  . Lipitor [atorvastatin]  11/21/2013  . Zocor [simvastatin]  11/21/2013    Past Medical History:  Diagnosis Date  . Allergy   . Arthritis   . Asthma   . CAD in native artery    a. nonobstructive by cardiac CTA.  Marland Kitchen GERD (gastroesophageal reflux disease)   . Hyperlipidemia   . Pulmonary nodules     Past Surgical History:  Procedure Laterality Date  . INGUINAL HERNIA REPAIR  2009  . LUMBAR LAMINECTOMY  1997  . NECK SURGERY  01/2010   c5-7  . VASECTOMY      Family History  Problem Relation Age of Onset  . Osteoporosis Mother   . Parkinson's disease Father   . Cancer Father   . Alcohol abuse Unknown   . Asthma Unknown   . Depression Unknown   . Valvular heart disease Unknown   . Colon cancer Paternal Grandmother 67    Social History   Socioeconomic History  . Marital status: Married    Spouse name: [name]  .  Number of children: 4  . Years of education: BA  . Highest education level: Not on file  Occupational History  . 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 Financial resource strain: Not on file  . Food insecurity:    Worry: Not on file    Inability: Not on file  . Transportation needs:    Medical: Not on file    Non-medical: Not on file  Tobacco Use  . Smoking status: Former Smoker    Packs/day: 0.50    Years: 20.00    Pack years: 10.00    Types: Cigarettes    Last attempt to quit: 06/22/1992    Years since quitting: 25.5  . Smokeless tobacco: Never Used  Substance and  Sexual Activity  . Alcohol use: Yes    Alcohol/week: 10.0 standard drinks    Types: 10 Standard drinks or equivalent per week  . Drug use: No  . Sexual activity: Not on file  Lifestyle  . Physical activity:    Days per week: Not on file    Minutes per session: Not on file  . Stress: Not on file  Relationships  . Social connections:    Talks on phone: Not on file    Gets together: Not on file    Attends religious service: Not on file    Active member of club or organization: Not on file    Attends meetings of clubs or organizations: Not on file    Relationship status: Not on file  . Intimate partner violence:    Fear of current or ex partner: Not on file    Emotionally abused: Not on file    Physically abused: Not on file    Forced sexual activity: Not on file  Other Topics Concern  . Not on file  Social History Narrative   Patient is married [name]) and has four children.   Patient lives at home with his wife and children.   Patient is working  At [employer].   Patient has a BA degree.   Patient used both hands.   Patient drinks three cups of coffee daily.    Review of systems: Review of Systems  Constitutional: Negative for fever and chills.  HENT: Negative.   Eyes: Negative for blurred vision.  Respiratory: as per HPI  Cardiovascular: Negative for chest pain and palpitations.  Gastrointestinal: Negative for vomiting, diarrhea, blood per rectum. Genitourinary: Negative for dysuria, urgency, frequency and hematuria.  Musculoskeletal: Negative for myalgias, back pain and joint pain.  Skin: Negative for itching and rash.  Neurological: Negative for dizziness, tremors, focal weakness, seizures and loss of consciousness.  Endo/Heme/Allergies: Negative for environmental allergies.  Psychiatric/Behavioral: Negative for depression, suicidal ideas and hallucinations.  All other systems reviewed and are negative.  Physical Exam: Blood pressure 122/72, pulse 72,  height 5\' 10"  (1.778 m), weight 181 lb 9.6 oz (82.4 kg), SpO2 97 %. Gen:      No acute distress HEENT:  EOMI, sclera anicteric Neck:     No masses; no thyromegaly Lungs:    Clear to auscultation bilaterally; normal respiratory effort CV:         Regular rate and rhythm; no murmurs Abd:      + bowel sounds; soft, non-tender; no palpable masses, no distension Ext:    No edema; adequate peripheral perfusion Skin:      Warm and dry; no rash Neuro: alert and oriented x 3 Psych: normal mood and affect  Data  Reviewed: FENO 01/06/18- 24  Chest x-ray 09/05/2017-no acute cardiopulmonary abnormality.  I have reviewed the images personally CT coronaries 10/19/2017- tiny pulmonary nodules measuring 4 mm or less.  I have reviewed the images personally.  Assessment:  Asthma He has mild intermittent symptoms, occasional exacerbation during spring season He has not needed to use his inhalers or prednisone in a long time We will evaluate with CBC differential, IgE levels and PFTs  Mild eczema Follows with dermatology and is getting topical creams. Consider Dupixent and for both asthma and eczema but since he has mild symptoms we can hold off on treatment for now.  Lung nodules The nodules are nonspecific, likely benign.  Follow-up with CT in 1 year.  Plan/Recommendations: - CBC differential, IgE levels, PFTs - Albuterol as needed - Follow-up CT without contrast in 1 year  Marshell Garfinkel MD [facility] 01/06/2018, 9:22 AM  CC: Dorena Cookey, MD

## 2018-01-07 LAB — IGE: IgE (Immunoglobulin E), Serum: 45 kU/L (ref <=114)

## 2018-01-28 ENCOUNTER — Ambulatory Visit (INDEPENDENT_AMBULATORY_CARE_PROVIDER_SITE_OTHER): Payer: BLUE CROSS/BLUE SHIELD | Admitting: Family Medicine

## 2018-01-28 DIAGNOSIS — Z23 Encounter for immunization: Secondary | ICD-10-CM | POA: Diagnosis not present

## 2018-02-08 ENCOUNTER — Ambulatory Visit (INDEPENDENT_AMBULATORY_CARE_PROVIDER_SITE_OTHER): Payer: BLUE CROSS/BLUE SHIELD | Admitting: Pulmonary Disease

## 2018-02-08 ENCOUNTER — Ambulatory Visit: Payer: BLUE CROSS/BLUE SHIELD | Admitting: Pulmonary Disease

## 2018-02-08 ENCOUNTER — Encounter: Payer: Self-pay | Admitting: Pulmonary Disease

## 2018-02-08 VITALS — BP 124/68 | HR 78 | Ht 69.75 in | Wt 182.0 lb

## 2018-02-08 DIAGNOSIS — R0600 Dyspnea, unspecified: Secondary | ICD-10-CM

## 2018-02-08 DIAGNOSIS — R911 Solitary pulmonary nodule: Secondary | ICD-10-CM

## 2018-02-08 LAB — PULMONARY FUNCTION TEST
DL/VA % pred: 93 %
DL/VA: 4.29 ml/min/mmHg/L
DLCO cor % pred: 90 %
DLCO cor: 28.99 ml/min/mmHg
DLCO unc % pred: 93 %
DLCO unc: 29.85 ml/min/mmHg
FEF 25-75 Post: 3.85 L/sec
FEF 25-75 Pre: 3.41 L/sec
FEF2575-%Change-Post: 13 %
FEF2575-%Pred-Post: 125 %
FEF2575-%Pred-Pre: 111 %
FEV1-%Change-Post: 3 %
FEV1-%Pred-Post: 105 %
FEV1-%Pred-Pre: 101 %
FEV1-Post: 3.85 L
FEV1-Pre: 3.72 L
FEV1FVC-%Change-Post: 2 %
FEV1FVC-%Pred-Pre: 104 %
FEV6-%Change-Post: 0 %
FEV6-%Pred-Post: 102 %
FEV6-%Pred-Pre: 102 %
FEV6-Post: 4.72 L
FEV6-Pre: 4.69 L
FEV6FVC-%Change-Post: 0 %
FEV6FVC-%Pred-Post: 104 %
FEV6FVC-%Pred-Pre: 104 %
FVC-%Change-Post: 0 %
FVC-%Pred-Post: 98 %
FVC-%Pred-Pre: 98 %
FVC-Post: 4.72 L
FVC-Pre: 4.7 L
Post FEV1/FVC ratio: 81 %
Post FEV6/FVC ratio: 100 %
Pre FEV1/FVC ratio: 79 %
Pre FEV6/FVC Ratio: 100 %
RV % pred: 90 %
RV: 1.97 L
TLC % pred: 95 %
TLC: 6.65 L

## 2018-02-08 NOTE — Progress Notes (Signed)
Jake [name]    [id number]    1960/06/22  Primary Care Physician:Todd, Jory Ee, MD  Referring Physician: Dorena Cookey, MD [place], [place] [address]  Chief complaint: Follow-up for asthma, lung nodule  HPI: 57 year old with history of atypical angina, asthma, allergies, hyperlipidemia Diagnosed with asthma many years ago.  He has occasional exacerbations in the fall season about every 2 years for which he uses prednisone, inhalers.  He currently has albuterol inhaler which he is not using.  He has used Qvar in the past History also noted for mild eczema and follows with dermatology for this He was evaluated in the ED and by cardiology recently for atypical chest pain.  He had a CT coronaries which showed nonobstructive coronary artery disease.  The CT also showed subcentimeter pulmonary nodules.  Pets: Dogs.  Has chickens outside his house.  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  Exposed to clay dust Exposures: Exposed to clear dust, chicken coop.  He tries to minimize his exposure and wears a mask. Smoking history: 10-pack-year smoker.  Quit in 1994  Travel history: No significant travel Relevant family history: Father was a smoker.  He had lung cancer, COPD  Interim history: He is here for review of his PFTs.  States that breathing is doing well with no complaints.  Hardly needs to use his rescue inhaler.  Physical Exam: Blood pressure 124/68, pulse 78, height 5' 9.75" (1.772 m), weight 182 lb (82.6 kg), SpO2 97 %. Gen:      No acute distress HEENT:  EOMI, sclera anicteric Neck:     No masses; no thyromegaly Lungs:    Clear to auscultation bilaterally; normal respiratory effort CV:         Regular rate and rhythm; no murmurs Abd:      + bowel sounds; soft, non-tender; no palpable masses, no distension Ext:    No  edema; adequate peripheral perfusion Skin:      Warm and dry; no rash Neuro: alert and oriented x 3 Psych: normal mood and affect  Data Reviewed: Imaging Chest x-ray 09/05/2017-no acute cardiopulmonary abnormality.  I have reviewed the images personally CT coronaries 10/19/2017- tiny pulmonary nodules measuring 4 mm or less.  I have reviewed the images personally.  PFTs 02/08/2018-FVC 4.72 [98%], FEV1 3.85 [95%), F/F 81, TLC 95%, DLCO 90% Normal test.  FENO 01/06/18- 24  Labs CBC 01/06/2018-WBC 5.3, eos 4.8%, absolute eosinophil count 254 IgE/8/19-45  Assessment:  Asthma He has mild intermittent symptoms, occasional exacerbation during spring season He has not needed to use his inhalers or prednisone in a long time PFTs reviewed with no obstruction. Continue albuterol as needed.  Mild eczema Follows with dermatology and is getting topical creams.  Lung nodules The nodules are nonspecific, likely benign.  Follow-up with CT in 1 year.  Plan/Recommendations: - Albuterol as needed - Follow-up CT without contrast in 1 year  Outpatient Encounter Medications as of 02/08/2018  Medication Sig  . albuterol (PROVENTIL HFA;VENTOLIN HFA) 108 (90 Base) MCG/ACT inhaler Inhale 2 puffs into the lungs every 6 (six) hours as needed for wheezing or shortness of breath (cough, shortness of breath or wheezing.).  Marland Kitchen benzonatate (TESSALON) 100 MG capsule Take I cap every 8 hrs as needed for cough  . ezetimibe (ZETIA) 10 MG tablet Take  10 mg by mouth daily.  . fluticasone (FLONASE) 50 MCG/ACT nasal spray Place 2 sprays into both nostrils daily.  Marland Kitchen [place] 420 MG/3.5ML SOCT ADMINISTER 420 MG SUBCUTANEOUSLY ONCE EVERY 30 DAYS   Facility-Administered Encounter Medications as of 02/08/2018  Medication  . 0.9 %  sodium chloride infusion    Allergies as of 02/08/2018 - Review Complete 02/08/2018  Allergen Reaction Noted  . Other Rash 08/15/2013  . Latex  03/01/2007  . Lipitor  [atorvastatin]  11/21/2013  . Zocor [simvastatin]  11/21/2013    Past Medical History:  Diagnosis Date  . Allergy   . Arthritis   . Asthma   . CAD in native artery    a. nonobstructive by cardiac CTA.  Marland Kitchen GERD (gastroesophageal reflux disease)   . Hyperlipidemia   . Pulmonary nodules     Past Surgical History:  Procedure Laterality Date  . INGUINAL HERNIA REPAIR  2009  . LUMBAR LAMINECTOMY  1997  . NECK SURGERY  01/2010   c5-7  . VASECTOMY      Family History  Problem Relation Age of Onset  . Osteoporosis Mother   . Parkinson's disease Father   . Cancer Father   . Alcohol abuse Unknown   . Asthma Unknown   . Depression Unknown   . Valvular heart disease Unknown   . Colon cancer Paternal Grandmother 21    Social History   Socioeconomic History  . Marital status: Married    Spouse name: [name]  . Number of children: 4  . Years of education: BA  . Highest education level: Not on file  Occupational History  . 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 Financial resource strain: Not on file  . Food insecurity:    Worry: Not on file    Inability: Not on file  . Transportation needs:    Medical: Not on file    Non-medical: Not on file  Tobacco Use  . Smoking status: Former Smoker    Packs/day: 0.50    Years: 20.00    Pack years: 10.00    Types: Cigarettes    Last attempt to quit: 06/22/1992    Years since quitting: 25.6  . Smokeless tobacco: Never Used  Substance and Sexual Activity  . Alcohol use: Yes    Alcohol/week: 10.0 standard drinks    Types: 10 Standard drinks or equivalent per week  . Drug use: No  . Sexual activity: Not on file  Lifestyle  . Physical activity:    Days per week: Not on file    Minutes per session: Not on file  . Stress: Not on file  Relationships  . Social connections:    Talks on phone: Not on file    Gets together: Not on file    Attends religious service: Not on file    Active member of club or organization:  Not on file    Attends meetings of clubs or organizations: Not on file    Relationship status: Not on file  . Intimate partner violence:    Fear of current or ex partner: Not on file    Emotionally abused: Not on file    Physically abused: Not on file    Forced sexual activity: Not on file  Other Topics Concern  . Not on file  Social History Narrative   Patient is married [name]) and has four children.   Patient lives at home with his wife and children.   Patient  is working  At [employer].   Patient has a BA degree.   Patient used both hands.   Patient drinks three cups of coffee daily.    Review of systems: Review of Systems  Constitutional: Negative for fever and chills.  HENT: Negative.   Eyes: Negative for blurred vision.  Respiratory: as per HPI  Cardiovascular: Negative for chest pain and palpitations.  Gastrointestinal: Negative for vomiting, diarrhea, blood per rectum. Genitourinary: Negative for dysuria, urgency, frequency and hematuria.  Musculoskeletal: Negative for myalgias, back pain and joint pain.  Skin: Negative for itching and rash.  Neurological: Negative for dizziness, tremors, focal weakness, seizures and loss of consciousness.  Endo/Heme/Allergies: Negative for environmental allergies.  Psychiatric/Behavioral: Negative for depression, suicidal ideas and hallucinations.  All other systems reviewed and are negative.  Marshell Garfinkel MD [facility] 02/08/2018, 1:43 PM  CC: Dorena Cookey, MD

## 2018-02-08 NOTE — Progress Notes (Signed)
PFT done today. 

## 2018-02-08 NOTE — Patient Instructions (Signed)
Lung function looks normal with no clear evidence of asthma Continue albuterol as needed We will see you back after your follow-up CT in 1 year.

## 2018-02-24 ENCOUNTER — Other Ambulatory Visit: Payer: Self-pay | Admitting: Cardiology

## 2018-02-25 DIAGNOSIS — C44519 Basal cell carcinoma of skin of other part of trunk: Secondary | ICD-10-CM | POA: Diagnosis not present

## 2018-02-25 DIAGNOSIS — Z85828 Personal history of other malignant neoplasm of skin: Secondary | ICD-10-CM | POA: Diagnosis not present

## 2018-02-25 DIAGNOSIS — L308 Other specified dermatitis: Secondary | ICD-10-CM | POA: Diagnosis not present

## 2018-02-25 DIAGNOSIS — L821 Other seborrheic keratosis: Secondary | ICD-10-CM | POA: Diagnosis not present

## 2018-02-25 DIAGNOSIS — D225 Melanocytic nevi of trunk: Secondary | ICD-10-CM | POA: Diagnosis not present

## 2018-02-25 DIAGNOSIS — L570 Actinic keratosis: Secondary | ICD-10-CM | POA: Diagnosis not present

## 2018-03-07 DIAGNOSIS — Z85828 Personal history of other malignant neoplasm of skin: Secondary | ICD-10-CM | POA: Diagnosis not present

## 2018-03-07 DIAGNOSIS — L245 Irritant contact dermatitis due to other chemical products: Secondary | ICD-10-CM | POA: Diagnosis not present

## 2018-03-07 DIAGNOSIS — C44519 Basal cell carcinoma of skin of other part of trunk: Secondary | ICD-10-CM | POA: Diagnosis not present

## 2018-03-18 ENCOUNTER — Encounter: Payer: Self-pay | Admitting: Physician Assistant

## 2018-03-25 ENCOUNTER — Other Ambulatory Visit: Payer: Self-pay | Admitting: Cardiology

## 2018-04-03 DIAGNOSIS — S90931A Unspecified superficial injury of right great toe, initial encounter: Secondary | ICD-10-CM | POA: Diagnosis not present

## 2018-04-05 ENCOUNTER — Encounter: Payer: Self-pay | Admitting: Physician Assistant

## 2018-04-05 NOTE — Progress Notes (Addendum)
Cardiology Office Note    Date:  04/06/2018  ID:  Jake [name], DOB 1960-12-15, MRN [id number] PCP:  Dorena Cookey, MD  Cardiologist:  Ena Dawley, MD   Chief Complaint: 6 month f/u CAD  History of Present Illness:  Jake [name] is a 57 y.o. male with history of nonobstructive CAD by cath 2014, ?possible pericarditis, pulm nodules (suspected benign, followed by pulm), first degree AV block, sinus bradycardia, asthma, arthritis, GERD, HLD who presents for 6 month follow-up. Remote stress echo 2010 wnl, EF 55%. He established care with Dr. Meda Coffee in 2014 for chest pain with mixed typical/atypical features. Cardiac CTA 04/2013 showed nonobstructive CAD. He has been followed for lipid control since that time, on Zetia and Repatha. He was seen in the ER 08/2017 for recurrent chest pain and ruled out for MI without significant abnormality found at that time (retrosternal chest pain constant for 48 hours+). He saw Dr. Meda Coffee back who felt he might have acute pericarditis and recommended ibuprofen 400mg  BID x 4 days and colchicine 0.6mg  po BID. There was possible prior improvement in prednisone. Cardiac CTA 10/19/17 showed still nonobstructive CAD - 0-25% prox RCA, 25-50% to 50-69% prox-mid LAD, -25% mid-distal LAD, 50-69% ostial RI - sent for FFR which confirmed no hemodynamically significant stenoses. His chest discomfort ended up improving with both measures to optimize asthma therapy and also the ibuprofen/colchicine. Last labs 12/2017 showed normal CBC Hgb 15.7, 08/2017 BMET wnl except glu 106, Cr normal at 0.94, 03/2017 lipids with LDL 26 and LFTs wnl. He saw Dr. Vaughan [place] for f/u of lung issues in 01/2018 with continued planned CT surveillance.  He presents back for follow-up overall feeling great from cardiac standpoint. He participates in high intensity exercise 3x/week in his garage with a friend. No CP, SOB, palpitations, presyncope or syncope. No bleeding issues. This time of year is good for  him from a pulm/allergy perspective. He has an area of skin on his chest which he has been told is pre-cancerous - this was previously biopsied. Derm told him if the Aldara cream does not resolve this, he might need to have the area excised. He also plans to see Dr. Ellene Route for some chronic nonspecific finger tingling possibly related to C spine issues.   Past Medical History:  Diagnosis Date  . Allergy   . Arthritis   . Asthma   . CAD in native artery    a. nonobstructive by cardiac CTA.  Marland Kitchen GERD (gastroesophageal reflux disease)   . Hyperlipidemia   . Pulmonary nodules   . Sinus bradycardia     Past Surgical History:  Procedure Laterality Date  . INGUINAL HERNIA REPAIR  2009  . LUMBAR LAMINECTOMY  1997  . NECK SURGERY  01/2010   c5-7  . VASECTOMY      Current Medications: Current Meds  Medication Sig  . albuterol (PROVENTIL HFA;VENTOLIN HFA) 108 (90 Base) MCG/ACT inhaler Inhale 2 puffs into the lungs every 6 (six) hours as needed for wheezing or shortness of breath (cough, shortness of breath or wheezing.).  Marland Kitchen benzonatate (TESSALON) 100 MG capsule Take I cap every 8 hrs as needed for cough  . ezetimibe (ZETIA) 10 MG tablet TAKE 1 TABLET ONCE DAILY.  . fluticasone (FLONASE) 50 MCG/ACT nasal spray Place 2 sprays into both nostrils as needed for allergies or rhinitis.  Marland Kitchen imiquimod (ALDARA) 5 % cream Apply 1 application topically at bedtime.   Marland Kitchen [place] 420 MG/3.5ML SOCT ADMINISTER 420 MG  SUBCUTANEOUSLY ONCE EVERY 30 DAYS  . sulfamethoxazole-trimethoprim (BACTRIM DS,SEPTRA DS) 800-160 MG tablet Take 1 tablet by mouth 2 (two) times daily.   . [DISCONTINUED] fluticasone (FLONASE) 50 MCG/ACT nasal spray Place 2 sprays into both nostrils daily. (Patient taking differently: Place 2 sprays into both nostrils as needed for allergies. )   Current Facility-Administered Medications for the 04/06/18 encounter (Office Visit) with Jake Pitter, PA-C  Medication  . 0.9 %   sodium chloride infusion    Allergies:   Latex; Lipitor [atorvastatin]; Other; and Zocor [simvastatin]   Social History   Socioeconomic History  . Marital status: Married    Spouse name: [name]  . Number of children: 4  . Years of education: BA  . Highest education level: Not on file  Occupational History  . 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 Financial resource strain: Not on file  . Food insecurity:    Worry: Not on file    Inability: Not on file  . Transportation needs:    Medical: Not on file    Non-medical: Not on file  Tobacco Use  . Smoking status: Former Smoker    Packs/day: 0.50    Years: 20.00    Pack years: 10.00    Types: Cigarettes    Last attempt to quit: 06/22/1992    Years since quitting: 25.8  . Smokeless tobacco: Never Used  Substance and Sexual Activity  . Alcohol use: Yes    Alcohol/week: 10.0 standard drinks    Types: 10 Standard drinks or equivalent per week  . Drug use: No  . Sexual activity: Not on file  Lifestyle  . Physical activity:    Days per week: Not on file    Minutes per session: Not on file  . Stress: Not on file  Relationships  . Social connections:    Talks on phone: Not on file    Gets together: Not on file    Attends religious service: Not on file    Active member of club or organization: Not on file    Attends meetings of clubs or organizations: Not on file    Relationship status: Not on file  Other Topics Concern  . Not on file  Social History Narrative   Patient is married [name]) and has four children.   Patient lives at home with his wife and children.   Patient is working  At [employer].   Patient has a BA degree.   Patient used both hands.   Patient drinks three cups of coffee daily.     Family History:  The patient'sfamily history includes Alcohol abuse in his unknown relative; Asthma in his unknown relative; Cancer in his father; Colon cancer (age of onset: 49) in his paternal  grandmother; Depression in his unknown relative; Osteoporosis in his mother; Parkinson's disease in his father; Valvular heart disease in his unknown relative.  ROS:   Please see the history of present illness. No flu like symptoms. Occasionally has random R shin pain, not c/w claudication - advised to f/u PCP All other systems are reviewed and otherwise negative.    PHYSICAL EXAM:   VS:  BP 124/76   Pulse 80   Ht 5' 9.5" (1.765 m)   Wt 183 lb (83 kg)   SpO2 95%   BMI 26.64 kg/m   BMI: Body mass index is 26.64 kg/m. GEN: Well nourished, well developed WM, in no acute distress HEENT: normocephalic, atraumatic Neck: no JVD,  carotid bruits, or masses Cardiac: RRR; no murmurs, rubs, or gallops, no edema, 2+ pedal pulses bilaterally Respiratory:  clear to auscultation bilaterally, normal work of breathing GI: soft, nontender, nondistended, + BS MS: no deformity or atrophy Skin: warm and dry, no rash Neuro:  Alert and Oriented x 3, Strength and sensation are intact, follows commands Psych: euthymic mood, full affect  Wt Readings from Last 3 Encounters:  04/06/18 183 lb (83 kg)  02/08/18 182 lb (82.6 kg)  01/06/18 181 lb 9.6 oz (82.4 kg)      Studies/Labs Reviewed:   EKG:  EKG was ordered today and personally reviewed by me and demonstrates NSR 77bpm, incomplete RBBB, no acute ST-T changes.  Recent Labs: 09/05/2017: BUN 8; Creatinine, Ser 0.94; Potassium 3.7; Sodium 141 01/06/2018: Hemoglobin 15.7; Platelets 305.0   Lipid Panel    Component Value Date/Time   CHOL 103 03/31/2017 0902   CHOL 101 (L) 12/24/2015 0750   CHOL 166 10/02/2014 0805   TRIG 76 03/31/2017 0902   TRIG 77 12/24/2015 0750   TRIG 97 10/02/2014 0805   HDL 62 03/31/2017 0902   HDL 66 12/24/2015 0750   HDL 50 10/02/2014 0805   CHOLHDL 1.7 03/31/2017 0902   CHOLHDL 1.5 12/24/2015 0750   CHOLHDL 5 05/08/2013 0754   VLDL 27.2 05/08/2013 0754   LDLCALC 26 03/31/2017 0902   LDLCALC 20 12/24/2015 0750    LDLCALC 97 10/02/2014 0805   LDLDIRECT 135.0 05/08/2013 0754    Additional studies/ records that were reviewed today include: Summarized above   ASSESSMENT & PLAN:   1. Mild CAD - no ischemic symptoms. Continue blood pressure surveillance and lipid control. Will add baby aspirin 81mg  daily. Given clinical stability and lack of angina, if he requires any interruption or clearance from dermatology or neurosurgery for interim procedures, he would cleared from a cardiac perspective without further CV testing, and would be OK to hold ASA for 7 days beforehand. 2. History of pericarditis - resolved. No recurrence. May have also been related to pulm status. 3. First degree AV block - stable. No symptoms of bradycardia. 4. Hyperlipidemia - check CMET/lipids today.  Disposition: F/u with Dr. Meda Coffee in 6 months.   Medication Adjustments/Labs and Tests Ordered: Current medicines are reviewed at length with the patient today.  Concerns regarding medicines are outlined above. Medication changes, Labs and Tests ordered today are summarized above and listed in the Patient Instructions accessible in Encounters.   Signed, Jake Pitter, PA-C  04/06/2018 8:52 AM    [address], [place], [place]  [address] Phone: [phone]; Fax: [phone]

## 2018-04-06 ENCOUNTER — Encounter: Payer: Self-pay | Admitting: Physician Assistant

## 2018-04-06 ENCOUNTER — Ambulatory Visit (INDEPENDENT_AMBULATORY_CARE_PROVIDER_SITE_OTHER): Payer: BLUE CROSS/BLUE SHIELD | Admitting: Physician Assistant

## 2018-04-06 VITALS — BP 124/76 | HR 80 | Ht 69.5 in | Wt 183.0 lb

## 2018-04-06 DIAGNOSIS — E785 Hyperlipidemia, unspecified: Secondary | ICD-10-CM | POA: Diagnosis not present

## 2018-04-06 DIAGNOSIS — I440 Atrioventricular block, first degree: Secondary | ICD-10-CM

## 2018-04-06 DIAGNOSIS — Z8679 Personal history of other diseases of the circulatory system: Secondary | ICD-10-CM

## 2018-04-06 DIAGNOSIS — I2510 Atherosclerotic heart disease of native coronary artery without angina pectoris: Secondary | ICD-10-CM

## 2018-04-06 LAB — COMPREHENSIVE METABOLIC PANEL
ALT: 19 IU/L (ref 0–44)
AST: 20 IU/L (ref 0–40)
Albumin/Globulin Ratio: 2.1 (ref 1.2–2.2)
Albumin: 4.8 g/dL (ref 3.5–5.5)
Alkaline Phosphatase: 69 IU/L (ref 39–117)
BUN/Creatinine Ratio: 10 (ref 9–20)
BUN: 11 mg/dL (ref 6–24)
Bilirubin Total: 0.5 mg/dL (ref 0.0–1.2)
CO2: 23 mmol/L (ref 20–29)
Calcium: 9.4 mg/dL (ref 8.7–10.2)
Chloride: 102 mmol/L (ref 96–106)
Creatinine, Ser: 1.09 mg/dL (ref 0.76–1.27)
GFR calc Af Amer: 86 mL/min/{1.73_m2} (ref >59)
GFR calc non Af Amer: 74 mL/min/{1.73_m2} (ref >59)
Globulin, Total: 2.3 g/dL (ref 1.5–4.5)
Glucose: 93 mg/dL (ref 65–99)
Potassium: 4.1 mmol/L (ref 3.5–5.2)
Sodium: 139 mmol/L (ref 134–144)
Total Protein: 7.1 g/dL (ref 6.0–8.5)

## 2018-04-06 LAB — LIPID PANEL
Chol/HDL Ratio: 1.6 ratio (ref 0.0–5.0)
Cholesterol, Total: 105 mg/dL (ref 100–199)
HDL: 64 mg/dL (ref >39)
LDL Calculated: 25 mg/dL (ref 0–99)
Triglycerides: 79 mg/dL (ref 0–149)
VLDL Cholesterol Cal: 16 mg/dL (ref 5–40)

## 2018-04-06 MED ORDER — ASPIRIN EC 81 MG PO TBEC: 81.0000 mg | DELAYED_RELEASE_TABLET | Freq: Every day | ORAL | 3 refills | Status: DC

## 2018-04-06 NOTE — Patient Instructions (Signed)
Medication Instructions:  Your physician has recommended you make the following change in your medication:  1.  START Aspirin 81 mg daily  If you need a refill on your cardiac medications before your next appointment, please call your pharmacy.   Lab work: TODAY;  CMET & LIPIDS  If you have labs (blood work) drawn today and your tests are completely normal, you will receive your results only by: Marland Kitchen MyChart Message (if you have MyChart) OR . A paper copy in the mail If you have any lab test that is abnormal or we need to change your treatment, we will call you to review the results.  Testing/Procedures: None ordered  Follow-Up: At [facility], you and your health needs are our priority.  As part of our continuing mission to provide you with exceptional heart care, we have created designated Provider Care Teams.  These Care Teams include your primary Cardiologist (physician) and Advanced Practice Providers (APPs -  Physician Assistants and Nurse Practitioners) who all work together to provide you with the care you need, when you need it. You will need a follow up appointment in 6 months.  Please call our office 2 months in advance to schedule this appointment.  You may see Ena Dawley, MD or one of the following Advanced Practice Providers on your designated Care Team:   [place], PA-C Melina Copa, PA-C . Ermalinda Barrios, PA-C  Any Other Special Instructions Will Be Listed Below (If Applicable).

## 2018-04-07 ENCOUNTER — Encounter: Payer: Self-pay | Admitting: Podiatry

## 2018-04-07 ENCOUNTER — Ambulatory Visit (INDEPENDENT_AMBULATORY_CARE_PROVIDER_SITE_OTHER): Payer: BLUE CROSS/BLUE SHIELD | Admitting: Podiatry

## 2018-04-07 VITALS — BP 128/82 | HR 78

## 2018-04-07 DIAGNOSIS — L600 Ingrowing nail: Secondary | ICD-10-CM

## 2018-04-07 MED ORDER — NEOMYCIN-POLYMYXIN-HC 3.5-10000-1 OT SOLN: OTIC | 1 refills | Status: DC

## 2018-04-10 NOTE — Progress Notes (Signed)
Subjective:   Patient ID: Jake [name], male   DOB: 57 y.o.   MRN: [id number]   HPI Patient presents stating he is developed a lot of pain around the right hallux when he tried to trim it himself.  States is been present for about 2 weeks and he has history of ingrown toenails that he works on himself but he was not able to do this time.  Patient does not smoke and likes to be active  Review of Systems  All other systems reviewed and are negative.       Objective:  Physical Exam  Constitutional: He appears well-developed and well-nourished.  Cardiovascular: Intact distal pulses.  Pulmonary/Chest: Effort normal.  Musculoskeletal: Normal range of motion.  Neurological: He is alert.  Skin: Skin is warm.  Nursing note and vitals reviewed.   Neurovascular status intact muscle strength is adequate range of motion within normal limits with patient found to have exquisite discomfort in the right big toe medial border with slight distal redness no active drainage no proximal edema erythema drainage noted and pain with palpation.  Patient is noted to have good digital perfusion well oriented x3 and is currently on antibiotic from his primary care physician for this    Assessment:  Acute ingrown toenail deformity right hallux medial border with structural changes of the toe     Plan:  H&P condition reviewed and recommended correction.  I explained procedure risk and patient wants surgery and today I infiltrated the right hallux 60 mill grams like Marcaine mixture sterile prep applied to the toe and using sterile instrumentation I remove the medial border exposed matrix and applied phenol 3 applications 30 seconds followed by alcohol lavage and sterile dressing.  Gave instructions on soaks and if any throbbing were to occur to take the dressing off immediately and if not leave on for 24 hours and begin utilizing Corticosporin otic solution which was prescribed.  Reappoint to recheck in 2 weeks or  earlier if needed

## 2018-04-20 ENCOUNTER — Ambulatory Visit: Payer: BLUE CROSS/BLUE SHIELD

## 2018-04-20 DIAGNOSIS — L600 Ingrowing nail: Secondary | ICD-10-CM

## 2018-04-20 NOTE — Patient Instructions (Signed)

## 2018-04-21 ENCOUNTER — Ambulatory Visit (INDEPENDENT_AMBULATORY_CARE_PROVIDER_SITE_OTHER): Payer: BLUE CROSS/BLUE SHIELD | Admitting: Podiatry

## 2018-04-21 ENCOUNTER — Encounter: Payer: Self-pay | Admitting: Podiatry

## 2018-04-21 DIAGNOSIS — L600 Ingrowing nail: Secondary | ICD-10-CM | POA: Diagnosis not present

## 2018-04-21 NOTE — Patient Instructions (Signed)

## 2018-04-21 NOTE — Progress Notes (Signed)
Patient is here today for follow-up appointment, recent procedure performed 04/07/2018, removal of ingrown toenail hallux right medial border.  He states that overall the area is healing well and is not having pain at this time.  No redness, no erythema, no drainage, no swelling, no other signs symptoms of infection.  Overall the area is healing well and scabbed over.  Discussed signs and symptoms of infection.  Verbal and written instructions were given to the patient.  He is to follow-up as needed with any acute symptom changes.

## 2018-04-24 NOTE — Progress Notes (Signed)
Subjective:   Patient ID: Jake [name], male   DOB: 57 y.o.   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  Explained procedure and risk and today I infiltrated the left hallux 60 mg like Marcaine mixture sterile prep applied to the toe using sterile instrumentation I remove the medial lateral borders exposed matrix and applied phenol 3 applications 30 seconds followed by alcohol lavage sterile dressing.  Gave instructions on soaks and what to do if the toe should start to throb with removal of the dressing recommended.  Encouraged to call with questions

## 2018-04-27 DIAGNOSIS — R030 Elevated blood-pressure reading, without diagnosis of hypertension: Secondary | ICD-10-CM | POA: Diagnosis not present

## 2018-04-27 DIAGNOSIS — R200 Anesthesia of skin: Secondary | ICD-10-CM | POA: Diagnosis not present

## 2018-04-27 DIAGNOSIS — Z6826 Body mass index (BMI) 26.0-26.9, adult: Secondary | ICD-10-CM | POA: Diagnosis not present

## 2018-04-27 DIAGNOSIS — R202 Paresthesia of skin: Secondary | ICD-10-CM | POA: Diagnosis not present

## 2018-05-09 DIAGNOSIS — G5603 Carpal tunnel syndrome, bilateral upper limbs: Secondary | ICD-10-CM | POA: Diagnosis not present

## 2018-05-10 DIAGNOSIS — Z6826 Body mass index (BMI) 26.0-26.9, adult: Secondary | ICD-10-CM | POA: Diagnosis not present

## 2018-05-10 DIAGNOSIS — R030 Elevated blood-pressure reading, without diagnosis of hypertension: Secondary | ICD-10-CM | POA: Diagnosis not present

## 2018-05-10 DIAGNOSIS — G5603 Carpal tunnel syndrome, bilateral upper limbs: Secondary | ICD-10-CM | POA: Diagnosis not present

## 2018-05-30 DIAGNOSIS — G5603 Carpal tunnel syndrome, bilateral upper limbs: Secondary | ICD-10-CM | POA: Diagnosis not present

## 2018-05-30 DIAGNOSIS — M79642 Pain in left hand: Secondary | ICD-10-CM | POA: Diagnosis not present

## 2018-05-30 DIAGNOSIS — G5601 Carpal tunnel syndrome, right upper limb: Secondary | ICD-10-CM | POA: Diagnosis not present

## 2018-05-30 DIAGNOSIS — M79641 Pain in right hand: Secondary | ICD-10-CM | POA: Diagnosis not present

## 2018-05-30 DIAGNOSIS — G5602 Carpal tunnel syndrome, left upper limb: Secondary | ICD-10-CM | POA: Diagnosis not present

## 2018-06-09 DIAGNOSIS — H43813 Vitreous degeneration, bilateral: Secondary | ICD-10-CM | POA: Diagnosis not present

## 2018-06-09 DIAGNOSIS — D3131 Benign neoplasm of right choroid: Secondary | ICD-10-CM | POA: Diagnosis not present

## 2018-06-09 DIAGNOSIS — H35362 Drusen (degenerative) of macula, left eye: Secondary | ICD-10-CM | POA: Diagnosis not present

## 2018-06-09 DIAGNOSIS — H35041 Retinal micro-aneurysms, unspecified, right eye: Secondary | ICD-10-CM | POA: Diagnosis not present

## 2018-06-10 DIAGNOSIS — Z85828 Personal history of other malignant neoplasm of skin: Secondary | ICD-10-CM | POA: Diagnosis not present

## 2018-06-10 DIAGNOSIS — L308 Other specified dermatitis: Secondary | ICD-10-CM | POA: Diagnosis not present

## 2018-06-13 ENCOUNTER — Other Ambulatory Visit: Payer: Self-pay | Admitting: Pharmacist

## 2018-06-13 MED ORDER — EVOLOCUMAB WITH INFUSOR 420 MG/3.5ML ~~LOC~~ SOCT: 1.0000 | SUBCUTANEOUS | 11 refills | Status: DC

## 2018-07-29 DIAGNOSIS — M79641 Pain in right hand: Secondary | ICD-10-CM | POA: Diagnosis not present

## 2018-07-29 DIAGNOSIS — G5603 Carpal tunnel syndrome, bilateral upper limbs: Secondary | ICD-10-CM | POA: Diagnosis not present

## 2018-07-29 DIAGNOSIS — M79642 Pain in left hand: Secondary | ICD-10-CM | POA: Diagnosis not present

## 2018-10-21 DIAGNOSIS — Z03818 Encounter for observation for suspected exposure to other biological agents ruled out: Secondary | ICD-10-CM | POA: Diagnosis not present

## 2018-11-16 ENCOUNTER — Telehealth: Payer: Self-pay | Admitting: Pharmacist

## 2018-11-16 DIAGNOSIS — E7801 Familial hypercholesterolemia: Secondary | ICD-10-CM

## 2018-11-16 NOTE — Telephone Encounter (Signed)
1. COVID-19 Pre-Screening Questions:  . In the past 7 to 10 days have you had a cough,  shortness of breath, headache, congestion, fever (100 or greater) body aches, chills, sore throat, or sudden loss of taste or sense of smell?  no . Have you been around anyone with known Covid 19.  no . Have you been around anyone who is awaiting Covid 19 test results in the past 7 to 10 days?  no . Have you been around anyone who has been exposed to Covid 19, or has mentioned symptoms of Covid 19 within the past 7 to 10 days?  no    2. Pt advised of visitor restrictions (no visitors allowed except if needed to conduct the visit). Also advised to arrive at appointment time and wear a mask.   Patient called requesting to have lipid panel done. He has been off zetia for about a week and would like to see what affect it has on his cholesterol. Also states he has a pain in his shin, wondering if its from [place].  Advised that it could be, would not know unless we stopped the medication. Advised this could possible improve with the twice weekly injection instead of the monthly. Scheduled for labs tomorrow per patient request

## 2018-11-17 ENCOUNTER — Other Ambulatory Visit: Payer: BC Managed Care – PPO | Admitting: *Deleted

## 2018-11-17 ENCOUNTER — Other Ambulatory Visit: Payer: Self-pay

## 2018-11-17 DIAGNOSIS — E7801 Familial hypercholesterolemia: Secondary | ICD-10-CM | POA: Diagnosis not present

## 2018-11-17 LAB — LIPID PANEL
Chol/HDL Ratio: 1.7 ratio (ref 0.0–5.0)
Cholesterol, Total: 111 mg/dL (ref 100–199)
HDL: 66 mg/dL (ref >39)
LDL Calculated: 33 mg/dL (ref 0–99)
Triglycerides: 58 mg/dL (ref 0–149)
VLDL Cholesterol Cal: 12 mg/dL (ref 5–40)

## 2018-11-30 DIAGNOSIS — M79642 Pain in left hand: Secondary | ICD-10-CM | POA: Diagnosis not present

## 2018-11-30 DIAGNOSIS — G5603 Carpal tunnel syndrome, bilateral upper limbs: Secondary | ICD-10-CM | POA: Diagnosis not present

## 2018-11-30 DIAGNOSIS — M79641 Pain in right hand: Secondary | ICD-10-CM | POA: Diagnosis not present

## 2018-12-26 ENCOUNTER — Encounter: Payer: Self-pay | Admitting: Gastroenterology

## 2019-01-02 ENCOUNTER — Encounter: Payer: Self-pay | Admitting: Gastroenterology

## 2019-01-12 ENCOUNTER — Ambulatory Visit (AMBULATORY_SURGERY_CENTER): Payer: Self-pay | Admitting: *Deleted

## 2019-01-12 ENCOUNTER — Other Ambulatory Visit: Payer: Self-pay

## 2019-01-12 VITALS — Temp 97.3°F | Ht 70.0 in | Wt 179.8 lb

## 2019-01-12 DIAGNOSIS — Z86010 Personal history of colonic polyps: Secondary | ICD-10-CM

## 2019-01-12 MED ORDER — PEG 3350-KCL-NA BICARB-NACL 420 G PO SOLR: ORAL | 0 refills | Status: DC

## 2019-01-12 NOTE — Progress Notes (Signed)
Pt had covid antibody test in May- negative results.  Pt is aware that care partner will wait in the car during procedure; if they feel like they will be too hot to wait in the car; they may wait in the lobby.  We want them to wear a mask (we do not have any that we can provide them), practice social distancing, and we will check their temperatures when they get here.  I did remind patient that their care partner needs to stay in the parking lot the entire time. Pt will wear mask into building.  No egg or soy allergy  No home oxygen use or problems with anesthesia  No medications for weight loss taken  emmi information given

## 2019-01-16 DIAGNOSIS — M79641 Pain in right hand: Secondary | ICD-10-CM | POA: Diagnosis not present

## 2019-01-16 DIAGNOSIS — M79642 Pain in left hand: Secondary | ICD-10-CM | POA: Diagnosis not present

## 2019-01-16 DIAGNOSIS — G5601 Carpal tunnel syndrome, right upper limb: Secondary | ICD-10-CM | POA: Diagnosis not present

## 2019-01-16 DIAGNOSIS — G5602 Carpal tunnel syndrome, left upper limb: Secondary | ICD-10-CM | POA: Diagnosis not present

## 2019-01-18 ENCOUNTER — Emergency Department (HOSPITAL_COMMUNITY)
Admission: EM | Admit: 2019-01-18 | Discharge: 2019-01-18 | Disposition: A | Discharge status: Home / Self Care | Payer: BC Managed Care – PPO | Attending: Emergency Medicine | Admitting: Emergency Medicine

## 2019-01-18 ENCOUNTER — Other Ambulatory Visit: Payer: Self-pay

## 2019-01-18 ENCOUNTER — Emergency Department (HOSPITAL_COMMUNITY): Payer: BC Managed Care – PPO

## 2019-01-18 ENCOUNTER — Encounter (HOSPITAL_COMMUNITY): Payer: Self-pay | Admitting: Emergency Medicine

## 2019-01-18 DIAGNOSIS — R110 Nausea: Secondary | ICD-10-CM | POA: Diagnosis not present

## 2019-01-18 DIAGNOSIS — I259 Chronic ischemic heart disease, unspecified: Secondary | ICD-10-CM | POA: Insufficient documentation

## 2019-01-18 DIAGNOSIS — Z20828 Contact with and (suspected) exposure to other viral communicable diseases: Secondary | ICD-10-CM | POA: Diagnosis not present

## 2019-01-18 DIAGNOSIS — R197 Diarrhea, unspecified: Secondary | ICD-10-CM | POA: Diagnosis not present

## 2019-01-18 DIAGNOSIS — J45909 Unspecified asthma, uncomplicated: Secondary | ICD-10-CM | POA: Diagnosis not present

## 2019-01-18 DIAGNOSIS — R072 Precordial pain: Secondary | ICD-10-CM | POA: Diagnosis not present

## 2019-01-18 DIAGNOSIS — R0789 Other chest pain: Secondary | ICD-10-CM | POA: Diagnosis not present

## 2019-01-18 DIAGNOSIS — Z87891 Personal history of nicotine dependence: Secondary | ICD-10-CM | POA: Diagnosis not present

## 2019-01-18 DIAGNOSIS — R079 Chest pain, unspecified: Secondary | ICD-10-CM | POA: Diagnosis not present

## 2019-01-18 DIAGNOSIS — I10 Essential (primary) hypertension: Secondary | ICD-10-CM | POA: Diagnosis not present

## 2019-01-18 DIAGNOSIS — R1013 Epigastric pain: Secondary | ICD-10-CM

## 2019-01-18 DIAGNOSIS — Z209 Contact with and (suspected) exposure to unspecified communicable disease: Secondary | ICD-10-CM | POA: Diagnosis not present

## 2019-01-18 HISTORY — DX: Atherosclerotic heart disease of native coronary artery without angina pectoris: I25.10

## 2019-01-18 LAB — COMPREHENSIVE METABOLIC PANEL
ALT: 18 U/L (ref 0–44)
AST: 23 U/L (ref 15–41)
Albumin: 4.4 g/dL (ref 3.5–5.0)
Alkaline Phosphatase: 59 U/L (ref 38–126)
Anion gap: 12 (ref 5–15)
BUN: 9 mg/dL (ref 6–20)
CO2: 23 mmol/L (ref 22–32)
Calcium: 9.6 mg/dL (ref 8.9–10.3)
Chloride: 103 mmol/L (ref 98–111)
Creatinine, Ser: 1.07 mg/dL (ref 0.61–1.24)
GFR calc Af Amer: >60 mL/min (ref >60)
GFR calc non Af Amer: >60 mL/min (ref >60)
Glucose, Bld: 125 mg/dL — ABNORMAL HIGH (ref 70–99)
Potassium: 3.6 mmol/L (ref 3.5–5.1)
Sodium: 138 mmol/L (ref 135–145)
Total Bilirubin: 1.1 mg/dL (ref 0.3–1.2)
Total Protein: 6.9 g/dL (ref 6.5–8.1)

## 2019-01-18 LAB — CBC WITH DIFFERENTIAL/PLATELET
Abs Immature Granulocytes: 0.02 10*3/uL (ref 0.00–0.07)
Basophils Absolute: 0 10*3/uL (ref 0.0–0.1)
Basophils Relative: 0 %
Eosinophils Absolute: 0.1 10*3/uL (ref 0.0–0.5)
Eosinophils Relative: 1 %
HCT: 44.5 % (ref 39.0–52.0)
Hemoglobin: 15.5 g/dL (ref 13.0–17.0)
Immature Granulocytes: 0 %
Lymphocytes Relative: 22 %
Lymphs Abs: 2.4 10*3/uL (ref 0.7–4.0)
MCH: 31.5 pg (ref 26.0–34.0)
MCHC: 34.8 g/dL (ref 30.0–36.0)
MCV: 90.4 fL (ref 80.0–100.0)
Monocytes Absolute: 0.7 10*3/uL (ref 0.1–1.0)
Monocytes Relative: 7 %
Neutro Abs: 7.6 10*3/uL (ref 1.7–7.7)
Neutrophils Relative %: 70 %
Platelets: 285 10*3/uL (ref 150–400)
RBC: 4.92 MIL/uL (ref 4.22–5.81)
RDW: 11.5 % (ref 11.5–15.5)
WBC: 10.9 10*3/uL — ABNORMAL HIGH (ref 4.0–10.5)
nRBC: 0 % (ref 0.0–0.2)

## 2019-01-18 LAB — LIPASE, BLOOD: Lipase: 24 U/L (ref 11–51)

## 2019-01-18 LAB — TROPONIN I (HIGH SENSITIVITY)
Troponin I (High Sensitivity): 3 ng/L (ref <18)
Troponin I (High Sensitivity): 4 ng/L (ref <18)

## 2019-01-18 MED ORDER — NITROGLYCERIN 0.4 MG SL SUBL
0.4000 mg | SUBLINGUAL_TABLET | SUBLINGUAL | Status: DC | PRN
  Administered 2019-01-18 (×2): 0.4 mg via SUBLINGUAL
  Filled 2019-01-18: qty 1

## 2019-01-18 MED ORDER — ONDANSETRON HCL 4 MG/2ML IJ SOLN
4.0000 mg | Freq: Once | INTRAMUSCULAR | Status: AC
  Administered 2019-01-18: 4 mg via INTRAVENOUS
  Filled 2019-01-18: qty 2

## 2019-01-18 MED ORDER — SODIUM CHLORIDE 0.9 % IV BOLUS (SEPSIS)
1000.0000 mL | Freq: Once | INTRAVENOUS | Status: AC
  Administered 2019-01-18: 1000 mL via INTRAVENOUS

## 2019-01-18 NOTE — ED Notes (Signed)
Patient verbalizes understanding of discharge instructions. Opportunity for questioning and answers were provided. Armband removed by staff, pt discharged from ED ambulatory.   

## 2019-01-18 NOTE — ED Provider Notes (Signed)
[facility] DEPARTMENT Provider Note   CSN: [id number] Arrival date & time: 01/18/19  0019    History   Chief Complaint Chief Complaint  Patient presents with  . Abdominal Pain    HPI Jake [name] is a 58 y.o. male.  HPI: A 58 year old patient with a history of hypercholesterolemia presents for evaluation of chest pain. Initial onset of pain was more than 6 hours ago. The patient's chest pain is described as heaviness/pressure/tightness, is not worse with exertion and is relieved by nitroglycerin. The patient complains of nausea. The patient's chest pain is middle- or left-sided, is not well-localized, is not sharp and does not radiate to the arms/jaw/neck. The patient denies diaphoresis. The patient has no history of stroke, has no history of peripheral artery disease, has not smoked in the past 90 days, denies any history of treated diabetes, has no relevant family history of coronary artery disease (first degree relative at less than age 27), is not hypertensive and does not have an elevated BMI (>=30).   The history is provided by the patient.  Abdominal Pain Pain location:  Epigastric Pain quality: pressure   Pain severity:  Moderate Onset quality:  Gradual Duration:  12 hours Timing:  Constant Progression:  Unchanged Chronicity:  New Relieved by:  Nothing Worsened by:  Nothing Associated symptoms: chest pain, chills, cough, diarrhea, fatigue and nausea   Associated symptoms: no fever, no hematochezia and no vomiting   Patient presents for an episode of epigastric pain as well as diarrhea.  Symptoms started about 12 hours ago. Reports fatigue, chills.  Reports up to 3 episodes of loose diarrhea that is nonbloody.  He reports nausea with no significant vomiting.  He reports mild cough.  He reports epigastric pain/pressure seems to go into his chest at times   Past Medical History:  Diagnosis Date  . Allergy   . Arthritis   . Asthma   . CAD  in native artery    a. nonobstructive by cardiac CTA.  Marland Kitchen Coronary artery disease   . GERD (gastroesophageal reflux disease)   . Hyperlipidemia   . Neuromuscular disorder ([place])    carpal tunnel bilateral wrists  . Pulmonary nodules   . Sinus bradycardia     Patient Active Problem List   Diagnosis Date Noted  . Obstructive sleep apnea 02/12/2015  . Hyperlipidemia 07/09/2014  . CAD (coronary artery disease) 07/04/2014  . External hemorrhoid 05/04/2013  . Atherosclerotic coronary vascular disease 05/03/2013  . GERD 10/03/2007  . VENTRAL HERNIA, SMALL 10/03/2007  . Allergic rhinitis 03/01/2007  . ESOPHAGEAL STRICTURE 03/01/2007  . POLYP, COLON 10/27/2005  . SCHATZKI'S RING, HX OF 10/27/2005    Past Surgical History:  Procedure Laterality Date  . COLONOSCOPY    . INGUINAL HERNIA REPAIR  2009  . LUMBAR LAMINECTOMY  1997  . NECK SURGERY  01/2010   c5-7  . VASECTOMY          Home Medications    Prior to Admission medications   Medication Sig Start Date End Date Taking? Authorizing Provider  albuterol (PROVENTIL HFA;VENTOLIN HFA) 108 (90 Base) MCG/ACT inhaler Inhale 2 puffs into the lungs every 6 (six) hours as needed for wheezing or shortness of breath (cough, shortness of breath or wheezing.). 09/13/17   Dorena Cookey, MD  Evolocumab with Infusor ([place]) 420 MG/3.5ML SOCT Inject 1 applicator into the skin every 28 (twenty-eight) days. 06/13/18   Dorothy Spark, MD  ezetimibe (ZETIA) 10  MG tablet TAKE 1 TABLET ONCE DAILY. 03/25/18   Dorothy Spark, MD  fluticasone Asencion Islam) 50 MCG/ACT nasal spray Place 2 sprays into both nostrils as needed for allergies or rhinitis.    [provider]  Ibuprofen (ADVIL PO) Take by mouth as needed.    [provider]  imiquimod (ALDARA) 5 % cream Apply 1 application topically at bedtime.  03/12/18   [provider]  polyethylene glycol-electrolytes (NULYTELY/GOLYTELY) 420 g solution  Golytely as directed 01/12/19   Milus Banister, MD    Family History Family History  Problem Relation Age of Onset  . Osteoporosis Mother   . Parkinson's disease Father   . Cancer Father   . Alcohol abuse Other   . Asthma Other   . Depression Other   . Valvular heart disease Other   . Colon cancer Paternal Grandmother 69  . Esophageal cancer Neg Hx   . Stomach cancer Neg Hx   . Rectal cancer Neg Hx     Social History Social History   Tobacco Use  . Smoking status: Former Smoker    Packs/day: 0.50    Years: 20.00    Pack years: 10.00    Types: Cigarettes    Quit date: 06/22/1992    Years since quitting: 26.5  . Smokeless tobacco: Never Used  Substance Use Topics  . Alcohol use: Yes    Alcohol/week: 10.0 standard drinks    Types: 10 Standard drinks or equivalent per week  . Drug use: No     Allergies   Latex, Lipitor [atorvastatin], Other, Zocor [simvastatin], and Crestor [rosuvastatin calcium]   Review of Systems Review of Systems  Constitutional: Positive for chills and fatigue. Negative for fever.  Respiratory: Positive for cough.   Cardiovascular: Positive for chest pain.  Gastrointestinal: Positive for abdominal pain, diarrhea and nausea. Negative for hematochezia and vomiting.  All other systems reviewed and are negative.    Physical Exam Updated Vital Signs BP 122/85   Pulse 86   Temp 98.8 F (37.1 C) (Oral)   Resp 14   Ht 1.778 m (5\' 10" )   Wt 81.6 kg   SpO2 96%   BMI 25.80 kg/m   Physical Exam CONSTITUTIONAL: Well developed/well nourished, no acute distress HEAD: Normocephalic/atraumatic EYES: EOMI/PERRL, no icterus ENMT: Mask in place NECK: supple no meningeal signs SPINE/BACK:entire spine nontender CV: S1/S2 noted, no murmurs/rubs/gallops noted LUNGS: Lungs are clear to auscultation bilaterally, no apparent distress ABDOMEN: soft, mild epigastric tenderness, no rebound or guarding, bowel sounds noted throughout abdomen GU:no cva  tenderness NEURO: Pt is awake/alert/appropriate, moves all extremitiesx4.  No facial droop.   EXTREMITIES: pulses normal/equal, full ROM, no lower extremity edema or tenderness SKIN: warm, color normal PSYCH: no abnormalities of mood noted, alert and oriented to situation   ED Treatments / Results  Labs (all labs ordered are listed, but only abnormal results are displayed) Labs Reviewed  CBC WITH DIFFERENTIAL/PLATELET - Abnormal; Notable for the following components:      Result Value   WBC 10.9 (*)    All other components within normal limits  COMPREHENSIVE METABOLIC PANEL - Abnormal; Notable for the following components:   Glucose, Bld 125 (*)    All other components within normal limits  NOVEL CORONAVIRUS, NAA (HOSPITAL ORDER, SEND-OUT TO REF LAB)  LIPASE, BLOOD  TROPONIN I (HIGH SENSITIVITY)  TROPONIN I (HIGH SENSITIVITY)    EKG EKG Interpretation  Date/Time:  Wednesday January 18 2019 00:40:42 EDT Ventricular Rate:  87 PR  Interval:    QRS Duration: 107 QT Interval:  375 QTC Calculation: 452 R Axis:   91 Text Interpretation:  Sinus arrhythmia Prolonged PR interval S1,S2,S3 pattern RSR' in V1 or V2, probably normal variant Baseline wander in lead(s) V2 No significant change since last tracing Confirmed by Ripley Fraise [phone]) on 01/18/2019 12:50:06 AM   Radiology Dg Chest Port 1 View  Result Date: 01/18/2019 CLINICAL DATA:  Chest and epigastric pain for 1 day EXAM: PORTABLE CHEST 1 VIEW COMPARISON:  Radiograph 09/05/2017, CT 10/19/2017 FINDINGS: No consolidation, features of edema, pneumothorax, or effusion. Pulmonary vascularity is normally distributed. The cardiomediastinal contours are unremarkable. No acute osseous or soft tissue abnormality. Cardiac monitoring leads overlie the chest. IMPRESSION: No acute cardiopulmonary abnormality Electronically Signed   By: Lovena Le M.D.   On: 01/18/2019 02:04    Procedures Procedures  Medications Ordered in ED  Medications  nitroGLYCERIN (NITROSTAT) SL tablet 0.4 mg (0.4 mg Sublingual Given 01/18/19 0140)  ondansetron (ZOFRAN) injection 4 mg (4 mg Intravenous Given 01/18/19 0042)  sodium chloride 0.9 % bolus 1,000 mL (0 mLs Intravenous Stopped 01/18/19 0125)  sodium chloride 0.9 % bolus 1,000 mL (0 mLs Intravenous Stopped 01/18/19 0418)     Initial Impression / Assessment and Plan / ED Course  I have reviewed the triage vital signs and the nursing notes.  Pertinent labs & imaging results that were available during my care of the patient were reviewed by me and considered in my medical decision making (see chart for details).    2:12 AM HEAR Score: 5 Patient presents with epigastric pain, nausea and 3 episodes of diarrhea.  He reports having some chest pain which he thinks is related with abdominal pain and nausea. He is overall well-appearing at this time.  He is in no acute distress. Initial labs and EKG have been ordered.  He declines pain medications, will give antiemetics and fluids. Patient demanding stat COVID-19 testing. I advised that stat testing currently is reserved for patients who are admitted/high risk.  Advised that he would likely be tested, but it may be a send out test as he may end up being discharged. 2:12 AM Reports that he first had chest heaviness that started earlier in the day that has been consistent. He does have a reported history of mild CAD.  We will add on troponin and chest x-ray testing Will give NTG 5:39 AM Patient improved, resting comfortably.  He is in no acute distress.  No active chest pain.  Repeat troponin essentially unchanged. I think given the history of some abdominal pain and diarrhea as well as chest pressure, low suspicion for ACS.  Low suspicion for PE or other acute cause of chest pain. Will d/c home  Jake [name] was evaluated in Emergency Department on 01/18/2019 for the symptoms described in the history of present illness. He was evaluated in  the context of the global COVID-19 pandemic, which necessitated consideration that the patient might be at risk for infection with the SARS-CoV-2 virus that causes COVID-19. Institutional protocols and algorithms that pertain to the evaluation of patients at risk for COVID-19 are in a state of rapid change based on information released by regulatory bodies including the CDC and federal and state organizations. These policies and algorithms were followed during the patient's care in the ED.  Final Clinical Impressions(s) / ED Diagnoses   Final diagnoses:  Epigastric pain  Diarrhea, unspecified type  Precordial pain    ED Discharge Orders  None       Ripley Fraise, MD 01/18/19 [phone]

## 2019-01-18 NOTE — Discharge Instructions (Addendum)

## 2019-01-18 NOTE — ED Triage Notes (Signed)
BIB [facility] from home with c/o of epigastric pain starting this afternoon. Also reports diarrhea, nausea, body aches, belching, chills and palpitations. Denies fevers or sick contacts. Received 324mg  Asprin and 8mg  Zofran PTA.

## 2019-01-19 LAB — NOVEL CORONAVIRUS, NAA (HOSP ORDER, SEND-OUT TO REF LAB; TAT 18-24 HRS): SARS-CoV-2, NAA: NOT DETECTED

## 2019-01-20 ENCOUNTER — Encounter: Payer: Self-pay | Admitting: Gastroenterology

## 2019-01-22 ENCOUNTER — Encounter (HOSPITAL_COMMUNITY): Payer: Self-pay

## 2019-01-22 ENCOUNTER — Other Ambulatory Visit: Payer: Self-pay

## 2019-01-22 ENCOUNTER — Ambulatory Visit (HOSPITAL_COMMUNITY)
Admission: EM | Admit: 2019-01-22 | Discharge: 2019-01-22 | Disposition: A | Discharge status: Home / Self Care | Payer: BC Managed Care – PPO | Attending: Emergency Medicine | Admitting: Emergency Medicine

## 2019-01-22 DIAGNOSIS — N451 Epididymitis: Secondary | ICD-10-CM | POA: Diagnosis not present

## 2019-01-22 DIAGNOSIS — R300 Dysuria: Secondary | ICD-10-CM | POA: Diagnosis not present

## 2019-01-22 LAB — POCT URINALYSIS DIP (DEVICE)
Bilirubin Urine: NEGATIVE
Glucose, UA: NEGATIVE mg/dL
Hgb urine dipstick: NEGATIVE
Ketones, ur: NEGATIVE mg/dL
Leukocytes,Ua: NEGATIVE
Nitrite: NEGATIVE
Protein, ur: NEGATIVE mg/dL
Specific Gravity, Urine: 1.02 (ref 1.005–1.030)
Urobilinogen, UA: 0.2 mg/dL (ref 0.0–1.0)
pH: 6.5 (ref 5.0–8.0)

## 2019-01-22 MED ORDER — CLOTRIMAZOLE 1 % EX CREA: TOPICAL_CREAM | CUTANEOUS | 0 refills | Status: DC

## 2019-01-22 MED ORDER — LEVOFLOXACIN 500 MG PO TABS: 500.0000 mg | ORAL_TABLET | Freq: Every day | ORAL | 0 refills | Status: DC

## 2019-01-22 NOTE — Discharge Instructions (Addendum)
Begin levofloxacin daily for 10 days Apply clotrimazole to tip of penis twice daily  Follow up with primary or urology for testicular ultrasound if symptoms persisting

## 2019-01-22 NOTE — ED Triage Notes (Addendum)
Pt states she has been having groin pain and left leg pain. Pt states he has some itching on his penis and discomfort while voiding.

## 2019-01-23 NOTE — ED Provider Notes (Signed)
[facility]    CSN: [id number] Arrival date & time: 01/22/19  1132      History   Chief Complaint Chief Complaint  Patient presents with  . Groin Pain    HPI Jake [name] is a 58 y.o. male history of asthma, CAD, hyperlipidemia, GERD, previous vasectomy, presenting today for evaluation of dysuria and testicular pain.  Patient states that over the past couple weeks he is noticed some discomfort within his right testicle.  Notes that he feels this discomfort to the more superior aspect of the testicle.  Feels it is worse with gravity.  Has history of varicocele on left.  More recently he has developed dysuria with urination and itching to the tip of his penis.  He denies any specific bumps or lesions.  Has noticed some faint erythema.  Denies history of diabetes.  Denies history of prostate or UTIs.  Denies concern for STDs.  Denies sudden onset of groin pain, has gradually come on and persisted.  Has previously had epididymitis.  He notes that he recently has had some chest pain, was seen in the emergency room for this with negative work-up.  Symptoms have largely improved, but continues to have some lower substernal and epigastric discomfort.  HPI  Past Medical History:  Diagnosis Date  . Allergy   . Arthritis   . Asthma   . CAD in native artery    a. nonobstructive by cardiac CTA.  Marland Kitchen Coronary artery disease   . GERD (gastroesophageal reflux disease)   . Hyperlipidemia   . Neuromuscular disorder ([place])    carpal tunnel bilateral wrists  . Pulmonary nodules   . Sinus bradycardia     Patient Active Problem List   Diagnosis Date Noted  . Obstructive sleep apnea 02/12/2015  . Hyperlipidemia 07/09/2014  . CAD (coronary artery disease) 07/04/2014  . External hemorrhoid 05/04/2013  . Atherosclerotic coronary vascular disease 05/03/2013  . GERD 10/03/2007  . VENTRAL HERNIA, SMALL 10/03/2007  . Allergic rhinitis 03/01/2007  . ESOPHAGEAL STRICTURE 03/01/2007  .  POLYP, COLON 10/27/2005  . SCHATZKI'S RING, HX OF 10/27/2005    Past Surgical History:  Procedure Laterality Date  . COLONOSCOPY    . INGUINAL HERNIA REPAIR  2009  . LUMBAR LAMINECTOMY  1997  . NECK SURGERY  01/2010   c5-7  . VASECTOMY         Home Medications    Prior to Admission medications   Medication Sig Start Date End Date Taking? Authorizing Provider  albuterol (PROVENTIL HFA;VENTOLIN HFA) 108 (90 Base) MCG/ACT inhaler Inhale 2 puffs into the lungs every 6 (six) hours as needed for wheezing or shortness of breath (cough, shortness of breath or wheezing.). 09/13/17   Dorena Cookey, MD  clotrimazole (LOTRIMIN) 1 % cream Apply to affected area 2 times daily 01/22/19   Wieters, [place] C, PA-C  Evolocumab with Infusor ([place]) 420 MG/3.5ML SOCT Inject 1 applicator into the skin every 28 (twenty-eight) days. 06/13/18   Dorothy Spark, MD  ezetimibe (ZETIA) 10 MG tablet TAKE 1 TABLET ONCE DAILY. 03/25/18   Dorothy Spark, MD  fluticasone Asencion Islam) 50 MCG/ACT nasal spray Place 2 sprays into both nostrils as needed for allergies or rhinitis.    [provider]  Ibuprofen (ADVIL PO) Take by mouth as needed.    [provider]  imiquimod (ALDARA) 5 % cream Apply 1 application topically at bedtime.  03/12/18   [provider]  levofloxacin (LEVAQUIN) 500 MG  tablet Take 1 tablet (500 mg total) by mouth daily. 01/22/19   Wieters, Hallie C, PA-C  polyethylene glycol-electrolytes (NULYTELY/GOLYTELY) 420 g solution Golytely as directed 01/12/19   Milus Banister, MD    Family History Family History  Problem Relation Age of Onset  . Osteoporosis Mother   . Parkinson's disease Father   . Cancer Father   . Alcohol abuse Other   . Asthma Other   . Depression Other   . Valvular heart disease Other   . Colon cancer Paternal Grandmother 66  . Esophageal cancer Neg Hx   . Stomach cancer Neg Hx   . Rectal cancer Neg Hx     Social  History Social History   Tobacco Use  . Smoking status: Former Smoker    Packs/day: 0.50    Years: 20.00    Pack years: 10.00    Types: Cigarettes    Quit date: 06/22/1992    Years since quitting: 26.6  . Smokeless tobacco: Never Used  Substance Use Topics  . Alcohol use: Yes    Alcohol/week: 10.0 standard drinks    Types: 10 Standard drinks or equivalent per week  . Drug use: No     Allergies   Latex, Lipitor [atorvastatin], Other, Zocor [simvastatin], and Crestor [rosuvastatin calcium]   Review of Systems Review of Systems  Constitutional: Negative for fever.  HENT: Negative for sore throat.   Respiratory: Negative for shortness of breath.   Cardiovascular: Negative for chest pain.  Gastrointestinal: Positive for abdominal pain. Negative for nausea and vomiting.  Genitourinary: Positive for dysuria and testicular pain. Negative for difficulty urinating, discharge, frequency, penile pain, penile swelling and scrotal swelling.  Skin: Negative for rash.  Neurological: Negative for dizziness, light-headedness and headaches.     Physical Exam Triage Vital Signs ED Triage Vitals  Enc Vitals Group     BP 01/22/19 1223 130/88     Pulse Rate 01/22/19 1223 83     Resp 01/22/19 1223 18     Temp 01/22/19 1223 98.8 F (37.1 C)     Temp Source 01/22/19 1223 Oral     SpO2 01/22/19 1223 98 %     Weight --      Height --      Head Circumference --      Peak Flow --      Pain Score 01/22/19 1221 6     Pain Loc --      Pain Edu? --      Excl. in [place]? --    No data found.  Updated Vital Signs BP 130/88 (BP Location: Right Arm)   Pulse 83   Temp 98.8 F (37.1 C) (Oral)   Resp 18   SpO2 98%   Visual Acuity Right Eye Distance:   Left Eye Distance:   Bilateral Distance:    Right Eye Near:   Left Eye Near:    Bilateral Near:     Physical Exam Vitals signs and nursing note reviewed.  Constitutional:      Appearance: He is well-developed.     Comments: No acute  distress  HENT:     Head: Normocephalic and atraumatic.     Nose: Nose normal.  Eyes:     Conjunctiva/sclera: Conjunctivae normal.  Neck:     Musculoskeletal: Neck supple.  Cardiovascular:     Rate and Rhythm: Normal rate.  Pulmonary:     Effort: Pulmonary effort is normal. No respiratory distress.  Abdominal:     General:  There is no distension.  Genitourinary:    Comments: Chaperone present, no obvious rash or lesions to penis/groin area, has faint erythema noted to the glans, urethral meatus appears erythematous, slight discomfort with manipulation of the urethral meatus, has discomfort to palpation of superior testicular area which worsened pain, no obvious swelling or scrotal discoloration Musculoskeletal: Normal range of motion.  Skin:    General: Skin is warm and dry.  Neurological:     Mental Status: He is alert and oriented to person, place, and time.      UC Treatments / Results  Labs (all labs ordered are listed, but only abnormal results are displayed) Labs Reviewed  POCT URINALYSIS DIP (DEVICE)    EKG   Radiology No results found.  Procedures Procedures (including critical care time)  Medications Ordered in UC Medications - No data to display  Initial Impression / Assessment and Plan / UC Course  I have reviewed the triage vital signs and the nursing notes.  Pertinent labs & imaging results that were available during my care of the patient were reviewed by me and considered in my medical decision making (see chart for details).    UA unremarkable. Given discomfort with associated dysuria, felt epididymitis most likely.  Possible prostatitis.  Do not suspect torsion at this time.  Possible varicocele or hydrocele causing symptoms.  Recommending outpatient follow-up testicular ultrasound.  In the meantime will treat with levofloxacillin 500 mg daily x10 days.  Also provided clotrimazole cream to apply to glans to treat any associated yeast infection.   Advised if pain worsening to follow-up in emergency room for emergent ultrasound.  Continue to monitor chest discomfort, possible GERD as cause of symptoms.  Follow-up if symptoms progressing or worsening.  Given recent work-up with improvement of symptoms, will defer further work-up today.  Discussed strict return precautions. Patient verbalized understanding and is agreeable with plan.  Final Clinical Impressions(s) / UC Diagnoses   Final diagnoses:  Dysuria  Epididymitis     Discharge Instructions     Begin levofloxacin daily for 10 days Apply clotrimazole to tip of penis twice daily  Follow up with primary or urology for testicular ultrasound if symptoms persisting   ED Prescriptions    Medication Sig Dispense Auth. Provider   levofloxacin (LEVAQUIN) 500 MG tablet Take 1 tablet (500 mg total) by mouth daily. 10 tablet Wieters, Hallie C, PA-C   clotrimazole (LOTRIMIN) 1 % cream Apply to affected area 2 times daily 30 g Wieters, Hallie C, PA-C     Controlled Substance Prescriptions [place] Controlled Substance Registry consulted? Not Applicable   Janith Lima, [place] 01/23/19 1104

## 2019-01-24 ENCOUNTER — Telehealth: Payer: Self-pay | Admitting: Gastroenterology

## 2019-01-24 DIAGNOSIS — R102 Pelvic and perineal pain: Secondary | ICD-10-CM | POA: Diagnosis not present

## 2019-01-24 DIAGNOSIS — N50811 Right testicular pain: Secondary | ICD-10-CM | POA: Diagnosis not present

## 2019-01-24 DIAGNOSIS — I861 Scrotal varices: Secondary | ICD-10-CM | POA: Diagnosis not present

## 2019-01-24 NOTE — Telephone Encounter (Signed)
Pt is scheduled for a colonoscopy tomorrow and sated that dulcolax makes him have cramps.

## 2019-01-24 NOTE — Telephone Encounter (Signed)
Instructed patient to take full dose of dulcolax. Patient verbalized that he understood.

## 2019-01-24 NOTE — Telephone Encounter (Signed)

## 2019-01-24 NOTE — Telephone Encounter (Signed)
Spoke with patient and he answered "NO" to all the Covid-19 screening questions

## 2019-01-25 ENCOUNTER — Other Ambulatory Visit: Payer: Self-pay

## 2019-01-25 ENCOUNTER — Ambulatory Visit (AMBULATORY_SURGERY_CENTER): Payer: BC Managed Care – PPO | Admitting: Gastroenterology

## 2019-01-25 ENCOUNTER — Encounter: Payer: Self-pay | Admitting: Gastroenterology

## 2019-01-25 VITALS — BP 109/68 | HR 70 | Temp 98.4°F | Resp 15 | Ht 70.0 in | Wt 179.0 lb

## 2019-01-25 DIAGNOSIS — Z86010 Personal history of colonic polyps: Secondary | ICD-10-CM

## 2019-01-25 DIAGNOSIS — K635 Polyp of colon: Secondary | ICD-10-CM | POA: Diagnosis not present

## 2019-01-25 DIAGNOSIS — Z1211 Encounter for screening for malignant neoplasm of colon: Secondary | ICD-10-CM | POA: Diagnosis not present

## 2019-01-25 DIAGNOSIS — D122 Benign neoplasm of ascending colon: Secondary | ICD-10-CM

## 2019-01-25 MED ORDER — SODIUM CHLORIDE 0.9 % IV SOLN: 500.0000 mL | Freq: Once | INTRAVENOUS | Status: DC

## 2019-01-25 NOTE — Progress Notes (Signed)
Report given to PACU, vss 

## 2019-01-25 NOTE — Patient Instructions (Signed)
YOU HAD AN ENDOSCOPIC PROCEDURE TODAY AT THE [facility]:   Refer to the procedure report that was given to you for any specific questions about what was found during the examination.  If the procedure report does not answer your questions, please call your gastroenterologist to clarify.  If you requested that your care partner not be given the details of your procedure findings, then the procedure report has been included in a sealed envelope for you to review at your convenience later.  YOU SHOULD EXPECT: Some feelings of bloating in the abdomen. Passage of more gas than usual.  Walking can help get rid of the air that was put into your GI tract during the procedure and reduce the bloating. If you had a lower endoscopy (such as a colonoscopy or flexible sigmoidoscopy) you may notice spotting of blood in your stool or on the toilet paper. If you underwent a bowel prep for your procedure, you may not have a normal bowel movement for a few days.  Please Note:  You might notice some irritation and congestion in your nose or some drainage.  This is from the oxygen used during your procedure.  There is no need for concern and it should clear up in a day or so.  SYMPTOMS TO REPORT IMMEDIATELY:   Following lower endoscopy (colonoscopy or flexible sigmoidoscopy):  Excessive amounts of blood in the stool  Significant tenderness or worsening of abdominal pains  Swelling of the abdomen that is new, acute  Fever of 100F or higher   For urgent or emergent issues, a gastroenterologist can be reached at any hour by calling [phone].   DIET:  We do recommend a small meal at first, but then you may proceed to your regular diet.  Drink plenty of fluids but you should avoid alcoholic beverages for 24 hours.  MEDICATIONS: Continue present medications.  Please see handouts given to you by your recovery nurse.  ACTIVITY:  You should plan to take it easy for the rest of today and you should  NOT DRIVE or use heavy machinery until tomorrow (because of the sedation medicines used during the test).    FOLLOW UP: Our staff will call the number listed on your records 48-72 hours following your procedure to check on you and address any questions or concerns that you may have regarding the information given to you following your procedure. If we do not reach you, we will leave a message.  We will attempt to reach you two times.  During this call, we will ask if you have developed any symptoms of COVID 19. If you develop any symptoms (ie: fever, flu-like symptoms, shortness of breath, cough etc.) before then, please call [phone].  If you test positive for Covid 19 in the 2 weeks post procedure, please call and report this information to us.    If any biopsies were taken you will be contacted by phone or by letter within the next 1-3 weeks.  Please call us at [phone] if you have not heard about the biopsies in 3 weeks.   Thank you for allowing us to provide for your healthcare needs today.   SIGNATURES/CONFIDENTIALITY: You and/or your care partner have signed paperwork which will be entered into your electronic medical record.  These signatures attest to the fact that that the information above on your After Visit Summary has been reviewed and is understood.  Full responsibility of the confidentiality of this discharge information lies with you and/or   your care-partner. 

## 2019-01-25 NOTE — Progress Notes (Signed)
Called to room to assist during endoscopic procedure.  Patient ID and intended procedure confirmed with present staff. Received instructions for my participation in the procedure from the performing physician.  

## 2019-01-25 NOTE — Op Note (Signed)
[place] Patient Name: Jake [name] Procedure Date: 01/25/2019 12:54 PM MRN: [id number] Endoscopist: Milus Banister , MD Age: 58 Referring MD:  Date of Birth: 1960/06/20 Gender: Male Account #: [id number] Procedure:                Colonoscopy Indications:              High risk colon cancer surveillance: Personal                            history of colonic polyps; colonoscopy 2007 one HP,                            Colonoscopy 2017 four subCM polyps (two TAs and one                            SSP) Medicines:                Monitored Anesthesia Care Procedure:                Pre-Anesthesia Assessment:                           - Prior to the procedure, a History and Physical                            was performed, and patient medications and                            allergies were reviewed. The patient's tolerance of                            previous anesthesia was also reviewed. The risks                            and benefits of the procedure and the sedation                            options and risks were discussed with the patient.                            All questions were answered, and informed consent                            was obtained. Prior Anticoagulants: The patient has                            taken no previous anticoagulant or antiplatelet                            agents. ASA Grade Assessment: II - A patient with                            mild systemic disease. After reviewing the risks  and benefits, the patient was deemed in                            satisfactory condition to undergo the procedure.                           After obtaining informed consent, the colonoscope                            was passed under direct vision. Throughout the                            procedure, the patient's blood pressure, pulse, and                            oxygen saturations were monitored continuously. The                         Colonoscope was introduced through the anus and                            advanced to the the cecum, identified by                            appendiceal orifice and ileocecal valve. The                            colonoscopy was performed without difficulty. The                            patient tolerated the procedure well. The quality                            of the bowel preparation was good. The ileocecal                            valve, appendiceal orifice, and rectum were                            photographed. Scope In: 1:42:14 PM Scope Out: 1:52:29 PM Scope Withdrawal Time: 0 hours 8 minutes 27 seconds  Total Procedure Duration: 0 hours 10 minutes 15 seconds  Findings:                 A 2 mm polyp was found in the ascending colon. The                            polyp was sessile. The polyp was removed with a                            cold snare. Resection and retrieval were complete.                           The exam was otherwise without abnormality on  direct and retroflexion views. Complications:            No immediate complications. Estimated blood loss:                            None. Estimated Blood Loss:     Estimated blood loss: none. Impression:               - One 2 mm polyp in the ascending colon, removed                            with a cold snare. Resected and retrieved.                           - The examination was otherwise normal on direct                            and retroflexion views. Recommendation:           - Patient has a contact number available for                            emergencies. The signs and symptoms of potential                            delayed complications were discussed with the                            patient. Return to normal activities tomorrow.                            Written discharge instructions were provided to the                            patient.                            - Resume previous diet.                           - Continue present medications.                           - Repeat colonoscopy is recommended. The                            colonoscopy date will be determined after pathology                            results from today's exam become available for                            review. Likely 7 years. Milus Banister, MD 01/25/2019 1:56:48 PM This report has been signed electronically.

## 2019-01-27 ENCOUNTER — Telehealth: Payer: Self-pay

## 2019-01-27 DIAGNOSIS — M720 Palmar fascial fibromatosis [Dupuytren]: Secondary | ICD-10-CM | POA: Diagnosis not present

## 2019-01-27 DIAGNOSIS — G5603 Carpal tunnel syndrome, bilateral upper limbs: Secondary | ICD-10-CM | POA: Diagnosis not present

## 2019-01-27 NOTE — Telephone Encounter (Signed)
  Follow up Call-  Call back number 01/25/2019  Post procedure Call Back phone  # [phone]  Permission to leave phone message Yes  Some recent data might be hidden     Patient questions:  Do you have a fever, pain , or abdominal swelling? No. Pain Score  0 *  Have you tolerated food without any problems? Yes.    Have you been able to return to your normal activities? Yes.    Do you have any questions about your discharge instructions: Diet   No. Medications  No. Follow up visit  No.  Do you have questions or concerns about your Care? No.  Actions: * If pain score is 4 or above: No action needed, pain <4.  1. Have you developed a fever since your procedure? no  2.   Have you had an respiratory symptoms (SOB or cough) since your procedure?no  3.   Have you tested positive for COVID 19 since your procedure no  4.   Have you had any family members/close contacts diagnosed with the COVID 19 since your procedure?  no   If yes to any of these questions please route to Joylene John, RN and Alphonsa Gin, Therapist, sports.

## 2019-01-30 ENCOUNTER — Encounter: Payer: Self-pay | Admitting: Gastroenterology

## 2019-01-30 ENCOUNTER — Other Ambulatory Visit: Payer: Self-pay

## 2019-01-30 ENCOUNTER — Ambulatory Visit (INDEPENDENT_AMBULATORY_CARE_PROVIDER_SITE_OTHER)
Admission: RE | Admit: 2019-01-30 | Discharge: 2019-01-30 | Disposition: A | Discharge status: Home / Self Care | Payer: BC Managed Care – PPO | Source: Ambulatory Visit | Attending: Pulmonary Disease | Admitting: Pulmonary Disease

## 2019-01-30 DIAGNOSIS — I2510 Atherosclerotic heart disease of native coronary artery without angina pectoris: Secondary | ICD-10-CM | POA: Diagnosis not present

## 2019-01-30 DIAGNOSIS — R911 Solitary pulmonary nodule: Secondary | ICD-10-CM | POA: Diagnosis not present

## 2019-01-30 DIAGNOSIS — R918 Other nonspecific abnormal finding of lung field: Secondary | ICD-10-CM | POA: Diagnosis not present

## 2019-01-30 DIAGNOSIS — K769 Liver disease, unspecified: Secondary | ICD-10-CM | POA: Diagnosis not present

## 2019-01-30 DIAGNOSIS — J984 Other disorders of lung: Secondary | ICD-10-CM | POA: Diagnosis not present

## 2019-02-07 ENCOUNTER — Other Ambulatory Visit: Payer: Self-pay | Admitting: Cardiology

## 2019-02-09 DIAGNOSIS — M25631 Stiffness of right wrist, not elsewhere classified: Secondary | ICD-10-CM | POA: Diagnosis not present

## 2019-02-09 DIAGNOSIS — M25642 Stiffness of left hand, not elsewhere classified: Secondary | ICD-10-CM | POA: Diagnosis not present

## 2019-02-14 DIAGNOSIS — H5203 Hypermetropia, bilateral: Secondary | ICD-10-CM | POA: Diagnosis not present

## 2019-02-14 DIAGNOSIS — H40033 Anatomical narrow angle, bilateral: Secondary | ICD-10-CM | POA: Diagnosis not present

## 2019-02-17 DIAGNOSIS — M25641 Stiffness of right hand, not elsewhere classified: Secondary | ICD-10-CM | POA: Diagnosis not present

## 2019-02-17 DIAGNOSIS — M25642 Stiffness of left hand, not elsewhere classified: Secondary | ICD-10-CM | POA: Diagnosis not present

## 2019-02-20 DIAGNOSIS — H2513 Age-related nuclear cataract, bilateral: Secondary | ICD-10-CM | POA: Diagnosis not present

## 2019-02-20 DIAGNOSIS — H40033 Anatomical narrow angle, bilateral: Secondary | ICD-10-CM | POA: Diagnosis not present

## 2019-02-23 DIAGNOSIS — M25632 Stiffness of left wrist, not elsewhere classified: Secondary | ICD-10-CM | POA: Diagnosis not present

## 2019-02-23 DIAGNOSIS — M25631 Stiffness of right wrist, not elsewhere classified: Secondary | ICD-10-CM | POA: Diagnosis not present

## 2019-03-02 ENCOUNTER — Telehealth: Payer: Self-pay | Admitting: Pulmonary Disease

## 2019-03-02 NOTE — Telephone Encounter (Signed)
Attempted to contact home number, unable to leave voicemail.

## 2019-03-02 NOTE — Telephone Encounter (Signed)
LMTCB

## 2019-03-03 NOTE — Telephone Encounter (Signed)
Patient returned call - he can be reached at [phone] -pr

## 2019-03-03 NOTE — Telephone Encounter (Signed)
LMTCB x2 for pt 

## 2019-03-03 NOTE — Telephone Encounter (Signed)
Spoke with the pt  He reviewed his CT Chest report on mychart from 01/30/2019  He is concerned about: Upper Abdomen: Stable 4 mm low-density lesion in the left liver.  He is asking what needs to be done about this and if this could be connected to the repatha that he is taking.  Please advise, thanks!

## 2019-03-03 NOTE — Telephone Encounter (Addendum)
The liver nodule is very tiny and likely insignificant. I am not familiar with the side effects of Repatha.  Perhaps he can discuss this with cardiology or whoever is prescribing the medication.

## 2019-03-03 NOTE — Telephone Encounter (Signed)
Spoke with the pt and notified of recs per Dr Vaughan Browner  He verbalized understanding  Copy of the ct was faxed to Dr Meda Coffee

## 2019-03-07 ENCOUNTER — Ambulatory Visit (INDEPENDENT_AMBULATORY_CARE_PROVIDER_SITE_OTHER): Payer: BC Managed Care – PPO

## 2019-03-07 ENCOUNTER — Other Ambulatory Visit: Payer: Self-pay

## 2019-03-07 DIAGNOSIS — Z23 Encounter for immunization: Secondary | ICD-10-CM

## 2019-05-01 ENCOUNTER — Telehealth: Payer: Self-pay | Admitting: Pharmacist

## 2019-05-01 NOTE — Telephone Encounter (Signed)
I have submitted to [facility] for renewal of Repatha 420mg  monthly. Awaiting determination. Called patient and left VM for update.

## 2019-05-02 NOTE — Telephone Encounter (Addendum)
PA for Repatha 420mg  monthly has been approved through 04/29/2020. Left VM on patient cell phone (per DPR)

## 2019-05-17 DIAGNOSIS — G5602 Carpal tunnel syndrome, left upper limb: Secondary | ICD-10-CM | POA: Diagnosis not present

## 2019-05-17 DIAGNOSIS — G5601 Carpal tunnel syndrome, right upper limb: Secondary | ICD-10-CM | POA: Diagnosis not present

## 2019-05-17 DIAGNOSIS — M79641 Pain in right hand: Secondary | ICD-10-CM | POA: Diagnosis not present

## 2019-05-17 DIAGNOSIS — M720 Palmar fascial fibromatosis [Dupuytren]: Secondary | ICD-10-CM | POA: Diagnosis not present

## 2019-05-17 DIAGNOSIS — G5603 Carpal tunnel syndrome, bilateral upper limbs: Secondary | ICD-10-CM | POA: Diagnosis not present

## 2019-05-22 ENCOUNTER — Other Ambulatory Visit: Payer: Self-pay | Admitting: Cardiology

## 2019-05-22 DIAGNOSIS — L308 Other specified dermatitis: Secondary | ICD-10-CM | POA: Diagnosis not present

## 2019-05-22 DIAGNOSIS — D225 Melanocytic nevi of trunk: Secondary | ICD-10-CM | POA: Diagnosis not present

## 2019-05-22 DIAGNOSIS — Z85828 Personal history of other malignant neoplasm of skin: Secondary | ICD-10-CM | POA: Diagnosis not present

## 2019-05-22 DIAGNOSIS — L723 Sebaceous cyst: Secondary | ICD-10-CM | POA: Diagnosis not present

## 2019-06-21 ENCOUNTER — Other Ambulatory Visit: Payer: Self-pay | Admitting: Pharmacist

## 2019-06-21 MED ORDER — REPATHA PUSHTRONEX SYSTEM 420 MG/3.5ML ~~LOC~~ SOCT: 1.0000 | SUBCUTANEOUS | 3 refills | Status: DC

## 2019-06-21 NOTE — Progress Notes (Signed)
Cardiology Office Note    Date:  06/27/2019   ID:  [name] Jake, DOB [birth year], MRN [id number]  PCP:  Dorena Cookey, MD (Inactive)  Cardiologist: Ena Dawley, MD EPS: None  Chief Complaint  Patient presents with  . Follow-up    History of Present Illness:  Jake [name] is a 59 y.o. male with history of nonobstructive CAD by cath in 2014, cardiac CTA 04/2013 nonobstructive CAD, possible pericarditis 2014, sinus bradycardia, HLD on Zetia and Repatha, first-degree A-V block.  Cardiac CTA 10/19/2017 nonobstructive CAD ostial RI 50 to 69% sent for FFR which confirm no hemodynamically significant stenosis.  Patient was last seen in our office by Melina Copa, PA-C 04/06/2018 and baby aspirin added.  Patient comes in for yearly f/u. 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 Has had occasional chest tightness-last one in December. Can't pin it down to exertion. Can happen driving his car. Describes it as sharp shooting pain that eases in minutes. Short lived. Hard for him to distingushLast August he went to ER with GI bug and had chest pain but troponins negative. Hasn't been exercising regularly since last summer. Walks the dogs a couple miles a couple days/week.   Past Medical History:  Diagnosis Date  . Allergy   . Arthritis   . Asthma   . CAD in native artery    a. nonobstructive by cardiac CTA.  Marland Kitchen Coronary artery disease   . GERD (gastroesophageal reflux disease)   . Hyperlipidemia   . Neuromuscular disorder ([place])    carpal tunnel bilateral wrists  . Pulmonary nodules   . Sinus bradycardia     Past Surgical History:  Procedure Laterality Date  . COLONOSCOPY    . INGUINAL HERNIA REPAIR  2009  . LUMBAR LAMINECTOMY  1997  . NECK SURGERY  01/2010   c5-7  . VASECTOMY      Current Medications: Current Meds  Medication Sig  . albuterol (PROVENTIL HFA;VENTOLIN HFA) 108 (90 Base) MCG/ACT  inhaler Inhale 2 puffs into the lungs every 6 (six) hours as needed for wheezing or shortness of breath (cough, shortness of breath or wheezing.).  Marland Kitchen Cholecalciferol (VITAMIN D3) 25 MCG (1000 UT) CAPS daily.   . clotrimazole (LOTRIMIN) 1 % cream Apply to affected area 2 times daily  . cyclobenzaprine (FLEXERIL) 10 MG tablet as needed.  . Evolocumab with Infusor ([place]) 420 MG/3.5ML SOCT Inject 1 applicator into the skin every 28 (twenty-eight) days.  Marland Kitchen ezetimibe (ZETIA) 10 MG tablet Take 1 tablet (10 mg total) by mouth daily.  . fluticasone (FLONASE) 50 MCG/ACT nasal spray Place 2 sprays into both nostrils as needed for allergies or rhinitis.  . Ibuprofen (ADVIL PO) Take by mouth as needed.  . Multiple Vitamins-Minerals (MULTIVITAMIN ADULT PO) daily.   . [DISCONTINUED] ezetimibe (ZETIA) 10 MG tablet TAKE 1 TABLET ONCE DAILY.   Current Facility-Administered Medications for the 06/27/19 encounter (Office Visit) with Imogene Burn, PA-C  Medication  . 0.9 %  sodium chloride infusion  . 0.9 %  sodium chloride infusion     Allergies:   Latex, Lipitor [atorvastatin], Other, Zocor [simvastatin], Crestor [rosuvastatin calcium], and Gluten meal   Social History   Socioeconomic History  . Marital status: Married    Spouse name: Jake [name]  . Number of children: 4  . Years of education: BA  . Highest education level: Not on file  Occupational  History  . 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 Smoking status: Former Smoker    Packs/day: 0.50    Years: 20.00    Pack years: 10.00    Types: Cigarettes    Quit date: 06/22/1992    Years since quitting: 27.0  . Smokeless tobacco: Never Used  Substance and Sexual Activity  . Alcohol use: Yes    Alcohol/week: 10.0 standard drinks    Types: 10 Standard drinks or equivalent per week  . Drug use: No  . Sexual activity: Not on file  Other Topics Concern  . Not on file  Social History Narrative   Patient is married  Jake [name]) and has four children.   Patient lives at home with his wife and children.   Patient is working  At [employer].   Patient has a BA degree.   Patient used both hands.   Patient drinks three cups of coffee daily.   Social Determinants of Health   Financial Resource Strain:   . Difficulty of Paying Living Expenses: Not on file  Food Insecurity:   . Worried About Charity fundraiser in the Last Year: Not on file  . Ran Out of Food in the Last Year: Not on file  Transportation Needs:   . Lack of Transportation (Medical): Not on file  . Lack of Transportation (Non-Medical): Not on file  Physical Activity:   . Days of Exercise per Week: Not on file  . Minutes of Exercise per Session: Not on file  Stress:   . Feeling of Stress : Not on file  Social Connections:   . Frequency of Communication with Friends and Family: Not on file  . Frequency of Social Gatherings with Friends and Family: Not on file  . Attends Religious Services: Not on file  . Active Member of Clubs or Organizations: Not on file  . Attends Archivist Meetings: Not on file  . Marital Status: Not on file     Family History:  The patient's family history includes Alcohol abuse in an other family member; Asthma in an other family member; Cancer in his father; Colon cancer (age of onset: 53) in his paternal grandmother; Depression in an other family member; Osteoporosis in his mother; Parkinson's disease in his father; Valvular heart disease in an other family member.   ROS:   Please see the history of present illness.    ROS All other systems reviewed and are negative.   PHYSICAL EXAM:   VS:  BP 112/80   Pulse 78   Ht 5\' 10"  (1.778 m)   Wt 176 lb 12.8 oz (80.2 kg)   SpO2 97%   BMI 25.37 kg/m   Physical Exam  GEN: Well nourished, well developed, in no acute distress  Neck: no JVD, carotid bruits, or masses Cardiac:RRR; no murmurs, rubs, or gallops  Respiratory:  clear to auscultation  bilaterally, normal work of breathing GI: soft, nontender, nondistended, + BS Ext: without cyanosis, clubbing, or edema, Good distal pulses bilaterally Neuro:  Alert and Oriented x 3 Psych: euthymic mood, full affect  Wt Readings from Last 3 Encounters:  06/27/19 176 lb 12.8 oz (80.2 kg)  01/25/19 179 lb (81.2 kg)  01/18/19 179 lb 12.8 oz (81.6 kg)      Studies/Labs Reviewed:   EKG:  EKG is not ordered today.    Recent Labs: 01/18/2019: ALT 18; BUN 9; Creatinine, Ser 1.07; Hemoglobin 15.5; Platelets 285; Potassium 3.6; Sodium 138  Lipid Panel    Component Value Date/Time   CHOL 115 06/26/2019 1209   CHOL 101 (L) 12/24/2015 0750   CHOL 166 10/02/2014 0805   TRIG 92 06/26/2019 1209   TRIG 77 12/24/2015 0750   TRIG 97 10/02/2014 0805   HDL 68 06/26/2019 1209   HDL 66 12/24/2015 0750   HDL 50 10/02/2014 0805   CHOLHDL 1.7 06/26/2019 1209   CHOLHDL 1.5 12/24/2015 0750   CHOLHDL 5 05/08/2013 0754   VLDL 27.2 05/08/2013 0754   LDLCALC 30 06/26/2019 1209   LDLCALC 20 12/24/2015 0750   LDLCALC 97 10/02/2014 0805   LDLDIRECT 135.0 05/08/2013 0754    Additional studies/ records that were reviewed today include:     IMPRESSION: 1. Coronary calcium score of 120. This was 69 percentile for age and sex matched control. In 2014 calcium score was 40 and represented 80 percentile for age/sex.   2. Normal coronary origin with right dominance.   3. When compared to the prior study from 2014 there is mild progression of CAD with mild worsening of the proximal LAD lesion and ostial ramus intermedius lesion, still non-obstructive. We will obtain additional CT FFR analysis.     Electronically Signed   By: Ena Dawley   On: 10/19/2017 15:20   ASSESSMENT:    1. Coronary artery disease due to lipid rich plaque   2. Familial hypercholesterolemia   3. History of pericarditis      PLAN:  In order of problems listed above:  CAD nonobstructive on coronary CTA 10/2017  calcium score 120 which is 78th percentile for age and sex match control, nonobstructive CAD and FFR confirmed this. Occasional pain that he contributes to asthma but will keep closer track of it. Doesn't want anymore radiation when offered a CT or NST. He'll let us know if he has any recurrent symptoms. Exercise 150 min weekly.   History of pericarditis-no reuccurence  Hyperlipidemia on Repatha and Zetia followed by her lipid clinic-having trouble getting Repatha refilled. Will contact lipid clinc.    Medication Adjustments/Labs and Tests Ordered: Current medicines are reviewed at length with the patient today.  Concerns regarding medicines are outlined above.  Medication changes, Labs and Tests ordered today are listed in the Patient Instructions below. Patient Instructions  Medication Instructions:  Your physician recommends that you continue on your current medications as directed. Please refer to the Current Medication list given to you today.  Labwork: None ordered.  Testing/Procedures: None ordered.  Follow-Up: Your physician recommends that you schedule a follow-up appointment in:   12 months with Dr. Meda Coffee  Any Other Special Instructions Will Be Listed Below (If Applicable).     If you need a refill on your cardiac medications before your next appointment, please call your pharmacy.     Sumner Boast, PA-C  06/27/2019 10:33 AM    [facility] [place], [place], [place]  [address] Phone: [phone]; Fax: [phone]

## 2019-06-24 ENCOUNTER — Other Ambulatory Visit: Payer: Self-pay | Admitting: Cardiology

## 2019-06-26 ENCOUNTER — Other Ambulatory Visit: Payer: BC Managed Care – PPO | Admitting: *Deleted

## 2019-06-26 ENCOUNTER — Other Ambulatory Visit: Payer: Self-pay

## 2019-06-26 ENCOUNTER — Telehealth: Payer: Self-pay | Admitting: Physician Assistant

## 2019-06-26 DIAGNOSIS — E785 Hyperlipidemia, unspecified: Secondary | ICD-10-CM

## 2019-06-26 NOTE — Telephone Encounter (Signed)
Patient calling stating he would like to get lab work done today before his appointment tomorrow. There are no orders for lab work, but he would like to get it done since he is on repatha.

## 2019-06-26 NOTE — Telephone Encounter (Signed)
That is fine for him to have a lipid panel checked before he sees Ermalinda Barrios tomorrow.

## 2019-06-26 NOTE — Telephone Encounter (Signed)
I spoke with patient. He would like to have lipid profile checked prior to appointment tomorrow.  He has been fasting.  I told patient lab work could be checked at tomorrow's appointment in case any additional lab work was needed if he would like to wait until that time. He has not had Repatha for about 40 days due to insurance issues with coverage.  He would like to have lipid profile checked today.  He will come in shortly to have lab work done.

## 2019-06-26 NOTE — Telephone Encounter (Signed)
Left message to call office

## 2019-06-27 ENCOUNTER — Ambulatory Visit (INDEPENDENT_AMBULATORY_CARE_PROVIDER_SITE_OTHER): Payer: BC Managed Care – PPO | Admitting: Physician Assistant

## 2019-06-27 ENCOUNTER — Encounter: Payer: Self-pay | Admitting: Physician Assistant

## 2019-06-27 VITALS — BP 112/80 | HR 78 | Ht 70.0 in | Wt 176.8 lb

## 2019-06-27 DIAGNOSIS — E7801 Familial hypercholesterolemia: Secondary | ICD-10-CM

## 2019-06-27 DIAGNOSIS — Z8679 Personal history of other diseases of the circulatory system: Secondary | ICD-10-CM | POA: Diagnosis not present

## 2019-06-27 DIAGNOSIS — I2583 Coronary atherosclerosis due to lipid rich plaque: Secondary | ICD-10-CM | POA: Diagnosis not present

## 2019-06-27 DIAGNOSIS — I2510 Atherosclerotic heart disease of native coronary artery without angina pectoris: Secondary | ICD-10-CM

## 2019-06-27 LAB — LIPID PANEL
Chol/HDL Ratio: 1.7 ratio (ref 0.0–5.0)
Cholesterol, Total: 115 mg/dL (ref 100–199)
HDL: 68 mg/dL (ref >39)
LDL Chol Calc (NIH): 30 mg/dL (ref 0–99)
Triglycerides: 92 mg/dL (ref 0–149)
VLDL Cholesterol Cal: 17 mg/dL (ref 5–40)

## 2019-06-27 MED ORDER — EZETIMIBE 10 MG PO TABS: 10.0000 mg | ORAL_TABLET | Freq: Every day | ORAL | 0 refills | Status: DC

## 2019-06-27 NOTE — Patient Instructions (Signed)
Medication Instructions:  Your physician recommends that you continue on your current medications as directed. Please refer to the Current Medication list given to you today.  Labwork: None ordered.  Testing/Procedures: None ordered.  Follow-Up: Your physician recommends that you schedule a follow-up appointment in:   12 months with Dr. Meda Coffee  Any Other Special Instructions Will Be Listed Below (If Applicable).     If you need a refill on your cardiac medications before your next appointment, please call your pharmacy.

## 2019-07-03 DIAGNOSIS — M79641 Pain in right hand: Secondary | ICD-10-CM | POA: Diagnosis not present

## 2019-07-03 DIAGNOSIS — M720 Palmar fascial fibromatosis [Dupuytren]: Secondary | ICD-10-CM | POA: Diagnosis not present

## 2019-07-03 DIAGNOSIS — G5603 Carpal tunnel syndrome, bilateral upper limbs: Secondary | ICD-10-CM | POA: Diagnosis not present

## 2019-07-12 DIAGNOSIS — K649 Unspecified hemorrhoids: Secondary | ICD-10-CM | POA: Diagnosis not present

## 2019-07-12 DIAGNOSIS — N509 Disorder of male genital organs, unspecified: Secondary | ICD-10-CM | POA: Diagnosis not present

## 2019-07-12 DIAGNOSIS — R102 Pelvic and perineal pain: Secondary | ICD-10-CM | POA: Diagnosis not present

## 2019-07-12 DIAGNOSIS — N5201 Erectile dysfunction due to arterial insufficiency: Secondary | ICD-10-CM | POA: Diagnosis not present

## 2019-08-07 ENCOUNTER — Other Ambulatory Visit: Payer: Self-pay | Admitting: Physician Assistant

## 2019-08-16 DIAGNOSIS — L301 Dyshidrosis [pompholyx]: Secondary | ICD-10-CM | POA: Diagnosis not present

## 2019-08-16 DIAGNOSIS — L308 Other specified dermatitis: Secondary | ICD-10-CM | POA: Diagnosis not present

## 2019-08-16 DIAGNOSIS — Z85828 Personal history of other malignant neoplasm of skin: Secondary | ICD-10-CM | POA: Diagnosis not present

## 2019-09-18 ENCOUNTER — Telehealth: Payer: Self-pay

## 2019-09-18 NOTE — Telephone Encounter (Signed)
Called and spoke with pharmacist Quillian Quince) gave verbal for replacement Repatha.

## 2019-09-18 NOTE — Telephone Encounter (Signed)
Pharmacy is calling stating that pt had a damaged repatha and they needed a new Rx sent to replace and dispense another repatha. Ph# [phone] and ref# [id number]. Please address

## 2019-09-27 DIAGNOSIS — L308 Other specified dermatitis: Secondary | ICD-10-CM | POA: Diagnosis not present

## 2019-09-27 DIAGNOSIS — Z85828 Personal history of other malignant neoplasm of skin: Secondary | ICD-10-CM | POA: Diagnosis not present

## 2019-10-04 DIAGNOSIS — M79644 Pain in right finger(s): Secondary | ICD-10-CM | POA: Diagnosis not present

## 2019-10-04 DIAGNOSIS — M65311 Trigger thumb, right thumb: Secondary | ICD-10-CM | POA: Diagnosis not present

## 2019-10-04 DIAGNOSIS — M79645 Pain in left finger(s): Secondary | ICD-10-CM | POA: Diagnosis not present

## 2019-10-04 DIAGNOSIS — M65312 Trigger thumb, left thumb: Secondary | ICD-10-CM | POA: Diagnosis not present

## 2019-11-07 ENCOUNTER — Telehealth: Payer: Self-pay | Admitting: Family Medicine

## 2019-11-07 MED ORDER — ALBUTEROL SULFATE HFA 108 (90 BASE) MCG/ACT IN AERS: 2.0000 | INHALATION_SPRAY | Freq: Four times a day (QID) | RESPIRATORY_TRACT | 0 refills | Status: DC | PRN

## 2019-11-07 NOTE — Addendum Note (Signed)
Addended by: Modena Morrow R on: 11/07/2019 04:58 PM   Modules accepted: Orders

## 2019-11-07 NOTE — Telephone Encounter (Signed)
One refill sent in

## 2019-11-07 NOTE — Telephone Encounter (Signed)
Refill once 

## 2019-11-07 NOTE — Addendum Note (Signed)
Addended by: Modena Morrow R on: 11/07/2019 04:59 PM   Modules accepted: Orders

## 2019-11-07 NOTE — Telephone Encounter (Signed)
albuterol (PROVENTIL HFA;VENTOLIN HFA) 108 (90 Base) MCG/ACT inhaler  [place], [place] Phone:  [phone]  Fax:  [phone]

## 2019-11-07 NOTE — Telephone Encounter (Signed)
Please advise if okay to fill pt has new pt appt on 11/14/19

## 2019-11-13 ENCOUNTER — Other Ambulatory Visit: Payer: Self-pay

## 2019-11-14 ENCOUNTER — Encounter: Payer: Self-pay | Admitting: Family Medicine

## 2019-11-14 ENCOUNTER — Ambulatory Visit (INDEPENDENT_AMBULATORY_CARE_PROVIDER_SITE_OTHER): Payer: BC Managed Care – PPO | Admitting: Family Medicine

## 2019-11-14 VITALS — BP 122/70 | HR 85 | Temp 97.7°F | Wt 179.2 lb

## 2019-11-14 DIAGNOSIS — L301 Dyshidrosis [pompholyx]: Secondary | ICD-10-CM

## 2019-11-14 DIAGNOSIS — I2510 Atherosclerotic heart disease of native coronary artery without angina pectoris: Secondary | ICD-10-CM

## 2019-11-14 DIAGNOSIS — E7801 Familial hypercholesterolemia: Secondary | ICD-10-CM | POA: Diagnosis not present

## 2019-11-14 DIAGNOSIS — I2583 Coronary atherosclerosis due to lipid rich plaque: Secondary | ICD-10-CM

## 2019-11-14 MED ORDER — FLOVENT HFA 44 MCG/ACT IN AERO: 2.0000 | INHALATION_SPRAY | Freq: Two times a day (BID) | RESPIRATORY_TRACT | 12 refills | Status: DC

## 2019-11-14 NOTE — Progress Notes (Signed)
Established Patient Office Visit  Subjective:  Patient ID: Jake [name], male    DOB: 02/04/1961  Age: 59 y.o. MRN: [id number]  CC:  Chief Complaint  Patient presents with  . Transitions Of Care    Pt would also like to discuss asthma and eczema     HPI Jake [name] presents for establishing care.  He is new to me but is not new to this practice.  He has seen Dr. Christie Nottingham for several years.  He has history of CAD by CT morphology studies previously with mild nonobstructive disease.  Coronary calcium score 120.  Proximal LAD lesion by previous imaging.  He is followed by cardiology and is on combination therapy with Zetia and Repatha.  Cannot tolerate statins.  No recent chest pains.  He has history of reported asthma.  He had called recently requesting refill of albuterol inhaler.  He states especially during the spring and summer he has had some wheezing about 3 to 4 days/week.  He has no difficulty breathing him but has some difficulties with expiration.  He thinks this is more allergic triggers.  He has taken steroid inhalers in the past.  Quit smoking 1994.  He has history of eczema involving the hands.  This sounds like dyshidrosis.  He is married and has 4 sons ranging in age from 46-25.  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  Past Medical History:  Diagnosis Date  . Allergy   . Arthritis   . Asthma   . CAD in native artery    a. nonobstructive by cardiac CTA.  Marland Kitchen Coronary artery disease   . GERD (gastroesophageal reflux disease)   . Hyperlipidemia   . Neuromuscular disorder ([place])    carpal tunnel bilateral wrists  . Pulmonary nodules   . Sinus bradycardia     Past Surgical History:  Procedure Laterality Date  . COLONOSCOPY    . INGUINAL HERNIA REPAIR  2009  . LUMBAR LAMINECTOMY  1997  . NECK SURGERY  01/2010   c5-7  . VASECTOMY      Family History  Problem Relation Age of Onset  .  Osteoporosis Mother   . Parkinson's disease Father   . Cancer Father   . Alcohol abuse Other   . Asthma Other   . Depression Other   . Valvular heart disease Other   . Colon cancer Paternal Grandmother 69  . Esophageal cancer Neg Hx   . Stomach cancer Neg Hx   . Rectal cancer Neg Hx     Social History   Socioeconomic History  . Marital status: Married    Spouse name: [name]  . Number of children: 4  . Years of education: BA  . Highest education level: Not on file  Occupational History  . 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 Smoking status: Former Smoker    Packs/day: 0.50    Years: 20.00    Pack years: 10.00    Types: Cigarettes    Quit date: 06/22/1992    Years since quitting: 27.4  . Smokeless tobacco: Never Used  Vaping Use  . Vaping Use: Never used  Substance and Sexual Activity  . Alcohol use: Yes    Alcohol/week: 10.0 standard drinks    Types: 10 Standard drinks or equivalent per week  . Drug use: No  . Sexual activity: Not on file  Other Topics Concern  . Not on  file  Social History Narrative   Patient is married [name]) and has four children.   Patient lives at home with his wife and children.   Patient is working  At [employer].   Patient has a BA degree.   Patient used both hands.   Patient drinks three cups of coffee daily.   Social Determinants of Health   Financial Resource Strain:   . Difficulty of Paying Living Expenses:   Food Insecurity:   . Worried About Charity fundraiser in the Last Year:   . Arboriculturist in the Last Year:   Transportation Needs:   . Film/video editor (Medical):   Marland Kitchen Lack of Transportation (Non-Medical):   Physical Activity:   . Days of Exercise per Week:   . Minutes of Exercise per Session:   Stress:   . Feeling of Stress :   Social Connections:   . Frequency of Communication with Friends and Family:   . Frequency of Social Gatherings with Friends and Family:   . Attends  Religious Services:   . Active Member of Clubs or Organizations:   . Attends Archivist Meetings:   Marland Kitchen Marital Status:   Intimate Partner Violence:   . Fear of Current or Ex-Partner:   . Emotionally Abused:   Marland Kitchen Physically Abused:   . Sexually Abused:     Outpatient Medications Prior to Visit  Medication Sig Dispense Refill  . albuterol (VENTOLIN HFA) 108 (90 Base) MCG/ACT inhaler Inhale 2 puffs into the lungs every 6 (six) hours as needed for wheezing or shortness of breath (cough, shortness of breath or wheezing.). 8 g 0  . Cholecalciferol (VITAMIN D3) 25 MCG (1000 UT) CAPS daily.     . clotrimazole (LOTRIMIN) 1 % cream Apply to affected area 2 times daily 30 g 0  . Evolocumab with Infusor ([place]) 420 MG/3.5ML SOCT Inject 1 applicator into the skin every 28 (twenty-eight) days. 10.5 mL 3  . ezetimibe (ZETIA) 10 MG tablet TAKE 1 TABLET ONCE DAILY. 30 tablet 9  . fluticasone (FLONASE) 50 MCG/ACT nasal spray Place 2 sprays into both nostrils as needed for allergies or rhinitis.    . Ibuprofen (ADVIL PO) Take by mouth as needed.    . Multiple Vitamins-Minerals (MULTIVITAMIN ADULT PO) daily.     . cyclobenzaprine (FLEXERIL) 10 MG tablet as needed.     Facility-Administered Medications Prior to Visit  Medication Dose Route Frequency Provider Last Rate Last Admin  . 0.9 %  sodium chloride infusion  500 mL Intravenous Continuous Milus Banister, MD      . 0.9 %  sodium chloride infusion  500 mL Intravenous Once Milus Banister, MD        Allergies  Allergen Reactions  . Latex     REACTION: RASH  . Lipitor [Atorvastatin]     Muscle aches at 20 mg    . Other Rash    Wool pants caused rash   . Zocor [Simvastatin]     Muscle aches at 20 mg daily  . Crestor [Rosuvastatin Calcium]     Muscle aches  . Gluten Meal     Digestive symptoms    ROS Review of Systems  Constitutional: Negative for appetite change, chills, fever and unexpected weight change.    Respiratory: Positive for wheezing. Negative for cough.   Cardiovascular: Negative for chest pain and palpitations.  Gastrointestinal: Negative for abdominal pain.  Hematological: Negative for adenopathy.  Objective:    Physical Exam Vitals reviewed.  Constitutional:      Appearance: Normal appearance.  Cardiovascular:     Rate and Rhythm: Normal rate and regular rhythm.  Pulmonary:     Effort: Pulmonary effort is normal.     Breath sounds: Normal breath sounds. No wheezing or rales.  Musculoskeletal:     Cervical back: Neck supple.     Right lower leg: No edema.     Left lower leg: No edema.  Lymphadenopathy:     Cervical: No cervical adenopathy.  Neurological:     Mental Status: He is alert.     BP 122/70 (BP Location: Left Arm, Patient Position: Sitting, Cuff Size: Normal)   Pulse 85   Temp 97.7 F (36.5 C) (Temporal)   Wt 179 lb 3.2 oz (81.3 kg)   SpO2 96%   BMI 25.71 kg/m  Wt Readings from Last 3 Encounters:  11/14/19 179 lb 3.2 oz (81.3 kg)  06/27/19 176 lb 12.8 oz (80.2 kg)  01/25/19 179 lb (81.2 kg)     Health Maintenance Due  Topic Date Due  . HIV Screening  Never done    There are no preventive care reminders to display for this patient.  Lab Results  Component Value Date   TSH 2.380 03/31/2017   Lab Results  Component Value Date   WBC 10.9 (H) 01/18/2019   HGB 15.5 01/18/2019   HCT 44.5 01/18/2019   MCV 90.4 01/18/2019   PLT 285 01/18/2019   Lab Results  Component Value Date   NA 138 01/18/2019   K 3.6 01/18/2019   CO2 23 01/18/2019   GLUCOSE 125 (H) 01/18/2019   BUN 9 01/18/2019   CREATININE 1.07 01/18/2019   BILITOT 1.1 01/18/2019   ALKPHOS 59 01/18/2019   AST 23 01/18/2019   ALT 18 01/18/2019   PROT 6.9 01/18/2019   ALBUMIN 4.4 01/18/2019   CALCIUM 9.6 01/18/2019   ANIONGAP 12 01/18/2019   GFR 88.80 07/04/2014   Lab Results  Component Value Date   CHOL 115 06/26/2019   Lab Results  Component Value Date   HDL 68  06/26/2019   Lab Results  Component Value Date   LDLCALC 30 06/26/2019   Lab Results  Component Value Date   TRIG 92 06/26/2019   Lab Results  Component Value Date   CHOLHDL 1.7 06/26/2019   No results found for: HGBA1C    Assessment & Plan:   #1 history of CAD with minimal nonobstructive disease.  He has hyperlipidemia with goal LDL less than 70.  Treated with Repatha and Zetia.  Recent lipids reviewed and to goal  #2 asthma.  Currently moderate persistent  -Add Flovent 44 mcg 2 puffs twice daily with rinse mouth after use.  Continue albuterol as needed.  #3 history of dyshidrotic eczema.  He is taken topical steroids for this in the past.  Meds ordered this encounter  Medications  . fluticasone (FLOVENT HFA) 44 MCG/ACT inhaler    Sig: Inhale 2 puffs into the lungs in the morning and at bedtime.    Dispense:  1 Inhaler    Refill:  12    Follow-up: Recommend scheduling physical at some point later this year   Carolann Littler, MD

## 2019-11-14 NOTE — Patient Instructions (Signed)
Consider setting up complete physical at some point this year.  Remember to rinse mouth after steroid inhaler use.

## 2019-12-11 ENCOUNTER — Ambulatory Visit (INDEPENDENT_AMBULATORY_CARE_PROVIDER_SITE_OTHER): Payer: BC Managed Care – PPO | Admitting: Family Medicine

## 2019-12-11 ENCOUNTER — Encounter: Payer: Self-pay | Admitting: Family Medicine

## 2019-12-11 ENCOUNTER — Other Ambulatory Visit: Payer: Self-pay

## 2019-12-11 VITALS — BP 122/64 | HR 80 | Temp 97.9°F | Ht 70.0 in | Wt 179.1 lb

## 2019-12-11 DIAGNOSIS — Z0000 Encounter for general adult medical examination without abnormal findings: Secondary | ICD-10-CM

## 2019-12-11 NOTE — Patient Instructions (Signed)
Preventive Care 41-60 Years Old, Male Preventive care refers to lifestyle choices and visits with your health care provider that can promote health and wellness. This includes:  A yearly physical exam. This is also called an annual well check.  Regular dental and eye exams.  Immunizations.  Screening for certain conditions.  Healthy lifestyle choices, such as eating a healthy diet, getting regular exercise, not using drugs or products that contain nicotine and tobacco, and limiting alcohol use. What can I expect for my preventive care visit? Physical exam Your health care provider will check:  Height and weight. These may be used to calculate body mass index (BMI), which is a measurement that tells if you are at a healthy weight.  Heart rate and blood pressure.  Your skin for abnormal spots. Counseling Your health care provider may ask you questions about:  Alcohol, tobacco, and drug use.  Emotional well-being.  Home and relationship well-being.  Sexual activity.  Eating habits.  Work and work Statistician. What immunizations do I need?  Influenza (flu) vaccine  This is recommended every year. Tetanus, diphtheria, and pertussis (Tdap) vaccine  You may need a Td booster every 10 years. Varicella (chickenpox) vaccine  You may need this vaccine if you have not already been vaccinated. Zoster (shingles) vaccine  You may need this after age 64. Measles, mumps, and rubella (MMR) vaccine  You may need at least one dose of MMR if you were born in 1957 or later. You may also need a second dose. Pneumococcal conjugate (PCV13) vaccine  You may need this if you have certain conditions and were not previously vaccinated. Pneumococcal polysaccharide (PPSV23) vaccine  You may need one or two doses if you smoke cigarettes or if you have certain conditions. Meningococcal conjugate (MenACWY) vaccine  You may need this if you have certain conditions. Hepatitis A  vaccine  You may need this if you have certain conditions or if you travel or work in places where you may be exposed to hepatitis A. Hepatitis B vaccine  You may need this if you have certain conditions or if you travel or work in places where you may be exposed to hepatitis B. Haemophilus influenzae type b (Hib) vaccine  You may need this if you have certain risk factors. Human papillomavirus (HPV) vaccine  If recommended by your health care provider, you may need three doses over 6 months. You may receive vaccines as individual doses or as more than one vaccine together in one shot (combination vaccines). Talk with your health care provider about the risks and benefits of combination vaccines. What tests do I need? Blood tests  Lipid and cholesterol levels. These may be checked every 5 years, or more frequently if you are over 60 years old.  Hepatitis C test.  Hepatitis B test. Screening  Lung cancer screening. You may have this screening every year starting at age 43 if you have a 30-pack-year history of smoking and currently smoke or have quit within the past 15 years.  Prostate cancer screening. Recommendations will vary depending on your family history and other risks.  Colorectal cancer screening. All adults should have this screening starting at age 72 and continuing until age 2. Your health care provider may recommend screening at age 14 if you are at increased risk. You will have tests every 1-10 years, depending on your results and the type of screening test.  Diabetes screening. This is done by checking your blood sugar (glucose) after you have not eaten  for a while (fasting). You may have this done every 1-3 years.  Sexually transmitted disease (STD) testing. Follow these instructions at home: Eating and drinking  Eat a diet that includes fresh fruits and vegetables, whole grains, lean protein, and low-fat dairy products.  Take vitamin and mineral supplements as  recommended by your health care provider.  Do not drink alcohol if your health care provider tells you not to drink.  If you drink alcohol: ? Limit how much you have to 0-2 drinks a day. ? Be aware of how much alcohol is in your drink. In the [place] one drink equals one 12 oz bottle of beer (355 mL), one 5 oz glass of wine (148 mL), or one 1 oz glass of hard liquor (44 mL). Lifestyle  Take daily care of your teeth and gums.  Stay active. Exercise for at least 30 minutes on 5 or more days each week.  Do not use any products that contain nicotine or tobacco, such as cigarettes, e-cigarettes, and chewing tobacco. If you need help quitting, ask your health care provider.  If you are sexually active, practice safe sex. Use a condom or other form of protection to prevent STIs (sexually transmitted infections).  Talk with your health care provider about taking a low-dose aspirin every day starting at age 53. What's next?  Go to your health care provider once a year for a well check visit.  Ask your health care provider how often you should have your eyes and teeth checked.  Stay up to date on all vaccines. This information is not intended to replace advice given to you by your health care provider. Make sure you discuss any questions you have with your health care provider. Document Revised: 05/12/2018 Document Reviewed: 05/12/2018 Elsevier Patient Education  2020 [employer].

## 2019-12-11 NOTE — Progress Notes (Signed)
Established Patient Office Visit  Subjective:  Patient ID: Jake [name], male    DOB: 01-14-1961  Age: 59 y.o. MRN: [id number]  CC:  Chief Complaint  Patient presents with  . Annual Exam    pt has no new concerns     HPI Jake [name] presents for physical exam.  He just recently established care.  He has hyperlipidemia and is treated through cardiology on Repatha.  He had recent lipid panel which was reviewed.  He has not had other labs in a couple years.  His colonoscopy is up-to-date.  He had Zostavax previously.  No history of Shingrix.  Tetanus up-to-date.  He had Covid vaccine.  Family history and social history reviewed with no major changes.  Refer to previous note for details  Past Medical History:  Diagnosis Date  . Allergy   . Arthritis   . Asthma   . CAD in native artery    a. nonobstructive by cardiac CTA.  Marland Kitchen Coronary artery disease   . GERD (gastroesophageal reflux disease)   . Hyperlipidemia   . Neuromuscular disorder ([place])    carpal tunnel bilateral wrists  . Pulmonary nodules   . Sinus bradycardia     Past Surgical History:  Procedure Laterality Date  . COLONOSCOPY    . INGUINAL HERNIA REPAIR  2009  . LUMBAR LAMINECTOMY  1997  . NECK SURGERY  01/2010   c5-7  . VASECTOMY      Family History  Problem Relation Age of Onset  . Osteoporosis Mother   . Parkinson's disease Father   . Cancer Father   . Alcohol abuse Other   . Asthma Other   . Depression Other   . Valvular heart disease Other   . Colon cancer Paternal Grandmother 60  . Esophageal cancer Neg Hx   . Stomach cancer Neg Hx   . Rectal cancer Neg Hx     Social History   Socioeconomic History  . Marital status: Married    Spouse name: [name]  . Number of children: 4  . Years of education: BA  . Highest education level: Not on file  Occupational History  . 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 Smoking status: Former Smoker    Packs/day: 0.50    Years: 20.00      Pack years: 10.00    Types: Cigarettes    Quit date: 06/22/1992    Years since quitting: 27.4  . Smokeless tobacco: Never Used  Vaping Use  . Vaping Use: Never used  Substance and Sexual Activity  . Alcohol use: Yes    Alcohol/week: 10.0 standard drinks    Types: 10 Standard drinks or equivalent per week  . Drug use: No  . Sexual activity: Not on file  Other Topics Concern  . Not on file  Social History Narrative   Patient is married [name]) and has four children.   Patient lives at home with his wife and children.   Patient is working  At [employer].   Patient has a BA degree.   Patient used both hands.   Patient drinks three cups of coffee daily.   Social Determinants of Health   Financial Resource Strain:   . Difficulty of Paying Living Expenses:   Food Insecurity:   . Worried About Charity fundraiser in the Last Year:   . Arboriculturist in the Last Year:   Transportation Needs:   . Lack  of Transportation (Medical):   Marland Kitchen Lack of Transportation (Non-Medical):   Physical Activity:   . Days of Exercise per Week:   . Minutes of Exercise per Session:   Stress:   . Feeling of Stress :   Social Connections:   . Frequency of Communication with Friends and Family:   . Frequency of Social Gatherings with Friends and Family:   . Attends Religious Services:   . Active Member of Clubs or Organizations:   . Attends Archivist Meetings:   Marland Kitchen Marital Status:   Intimate Partner Violence:   . Fear of Current or Ex-Partner:   . Emotionally Abused:   Marland Kitchen Physically Abused:   . Sexually Abused:     Outpatient Medications Prior to Visit  Medication Sig Dispense Refill  . albuterol (VENTOLIN HFA) 108 (90 Base) MCG/ACT inhaler Inhale 2 puffs into the lungs every 6 (six) hours as needed for wheezing or shortness of breath (cough, shortness of breath or wheezing.). 8 g 0  . Cholecalciferol (VITAMIN D3) 25 MCG (1000 UT) CAPS daily.     . clotrimazole (LOTRIMIN)  1 % cream Apply to affected area 2 times daily 30 g 0  . Evolocumab with Infusor ([place]) 420 MG/3.5ML SOCT Inject 1 applicator into the skin every 28 (twenty-eight) days. 10.5 mL 3  . ezetimibe (ZETIA) 10 MG tablet TAKE 1 TABLET ONCE DAILY. 30 tablet 9  . fluticasone (FLONASE) 50 MCG/ACT nasal spray Place 2 sprays into both nostrils as needed for allergies or rhinitis.    . fluticasone (FLOVENT HFA) 44 MCG/ACT inhaler Inhale 2 puffs into the lungs in the morning and at bedtime. 1 Inhaler 12  . Ibuprofen (ADVIL PO) Take by mouth as needed.    . Multiple Vitamins-Minerals (MULTIVITAMIN ADULT PO) daily.      Facility-Administered Medications Prior to Visit  Medication Dose Route Frequency Provider Last Rate Last Admin  . 0.9 %  sodium chloride infusion  500 mL Intravenous Continuous Milus Banister, MD      . 0.9 %  sodium chloride infusion  500 mL Intravenous Once Milus Banister, MD        Allergies  Allergen Reactions  . Latex     REACTION: RASH  . Lipitor [Atorvastatin]     Muscle aches at 20 mg    . Other Rash    Wool pants caused rash   . Zocor [Simvastatin]     Muscle aches at 20 mg daily  . Crestor [Rosuvastatin Calcium]     Muscle aches  . Gluten Meal     Digestive symptoms    ROS Review of Systems  Constitutional: Negative for activity change, appetite change, fatigue and fever.  HENT: Negative for congestion, ear pain and trouble swallowing.   Eyes: Negative for pain and visual disturbance.  Respiratory: Negative for cough, shortness of breath and wheezing.   Cardiovascular: Negative for chest pain and palpitations.  Gastrointestinal: Negative for abdominal distention, abdominal pain, blood in stool, constipation, diarrhea, nausea, rectal pain and vomiting.  Genitourinary: Negative for dysuria, hematuria and testicular pain.  Musculoskeletal: Negative for arthralgias and joint swelling.  Skin: Negative for rash.  Neurological: Negative for  dizziness, syncope and headaches.  Hematological: Negative for adenopathy.  Psychiatric/Behavioral: Negative for confusion and dysphoric mood.      Objective:    Physical Exam Constitutional:      General: He is not in acute distress.    Appearance: He is well-developed.  HENT:  Head: Normocephalic and atraumatic.     Right Ear: External ear normal.     Left Ear: External ear normal.  Eyes:     Conjunctiva/sclera: Conjunctivae normal.     Pupils: Pupils are equal, round, and reactive to light.  Neck:     Thyroid: No thyromegaly.  Cardiovascular:     Rate and Rhythm: Normal rate and regular rhythm.     Heart sounds: Normal heart sounds. No murmur heard.   Pulmonary:     Effort: No respiratory distress.     Breath sounds: No wheezing or rales.  Abdominal:     General: Bowel sounds are normal. There is no distension.     Palpations: Abdomen is soft. There is no mass.     Tenderness: There is no abdominal tenderness. There is no guarding or rebound.  Musculoskeletal:     Cervical back: Normal range of motion and neck supple.  Lymphadenopathy:     Cervical: No cervical adenopathy.  Skin:    Findings: No rash.  Neurological:     Mental Status: He is alert and oriented to person, place, and time.     Cranial Nerves: No cranial nerve deficit.     Deep Tendon Reflexes: Reflexes normal.     BP 122/64 (BP Location: Left Arm, Patient Position: Sitting, Cuff Size: Normal)   Pulse 80   Temp 97.9 F (36.6 C) (Temporal)   Ht 5\' 10"  (1.778 m)   Wt 179 lb 1.6 oz (81.2 kg)   SpO2 97%   BMI 25.70 kg/m  Wt Readings from Last 3 Encounters:  12/11/19 179 lb 1.6 oz (81.2 kg)  11/14/19 179 lb 3.2 oz (81.3 kg)  06/27/19 176 lb 12.8 oz (80.2 kg)     Health Maintenance Due  Topic Date Due  . HIV Screening  Never done    There are no preventive care reminders to display for this patient.  Lab Results  Component Value Date   TSH 2.380 03/31/2017   Lab Results  Component  Value Date   WBC 10.9 (H) 01/18/2019   HGB 15.5 01/18/2019   HCT 44.5 01/18/2019   MCV 90.4 01/18/2019   PLT 285 01/18/2019   Lab Results  Component Value Date   NA 138 01/18/2019   K 3.6 01/18/2019   CO2 23 01/18/2019   GLUCOSE 125 (H) 01/18/2019   BUN 9 01/18/2019   CREATININE 1.07 01/18/2019   BILITOT 1.1 01/18/2019   ALKPHOS 59 01/18/2019   AST 23 01/18/2019   ALT 18 01/18/2019   PROT 6.9 01/18/2019   ALBUMIN 4.4 01/18/2019   CALCIUM 9.6 01/18/2019   ANIONGAP 12 01/18/2019   GFR 88.80 07/04/2014   Lab Results  Component Value Date   CHOL 115 06/26/2019   Lab Results  Component Value Date   HDL 68 06/26/2019   Lab Results  Component Value Date   LDLCALC 30 06/26/2019   Lab Results  Component Value Date   TRIG 92 06/26/2019   Lab Results  Component Value Date   CHOLHDL 1.7 06/26/2019   No results found for: HGBA1C    Assessment & Plan:   Problem List Items Addressed This Visit    None    Visit Diagnoses    Physical exam    -  Primary   Relevant Orders   Basic metabolic panel   CBC with Differential/Platelet   TSH   Hepatic function panel   PSA    We discussed Shingrix vaccine and he  will consider checking on insurance coverage  Repeat colonoscopy 5 years from previous  Obtain lab work as above.  We did not obtain lipid panel since this was recently obtained through cardiology  Continue regular exercise habits  No orders of the defined types were placed in this encounter.   Follow-up: No follow-ups on file.    Carolann Littler, MD

## 2019-12-12 LAB — BASIC METABOLIC PANEL
BUN: 10 mg/dL (ref 7–25)
CO2: 27 mmol/L (ref 20–32)
Calcium: 9.1 mg/dL (ref 8.6–10.3)
Chloride: 105 mmol/L (ref 98–110)
Creat: 0.96 mg/dL (ref 0.70–1.33)
Glucose, Bld: 95 mg/dL (ref 65–99)
Potassium: 3.8 mmol/L (ref 3.5–5.3)
Sodium: 140 mmol/L (ref 135–146)

## 2019-12-12 LAB — CBC WITH DIFFERENTIAL/PLATELET
Absolute Monocytes: 490 cells/uL (ref 200–950)
Basophils Absolute: 20 cells/uL (ref 0–200)
Basophils Relative: 0.3 %
Eosinophils Absolute: 279 cells/uL (ref 15–500)
Eosinophils Relative: 4.1 %
HCT: 45 % (ref 38.5–50.0)
Hemoglobin: 15.3 g/dL (ref 13.2–17.1)
Lymphs Abs: 2258 cells/uL (ref 850–3900)
MCH: 31.5 pg (ref 27.0–33.0)
MCHC: 34 g/dL (ref 32.0–36.0)
MCV: 92.6 fL (ref 80.0–100.0)
MPV: 9 fL (ref 7.5–12.5)
Monocytes Relative: 7.2 %
Neutro Abs: 3754 cells/uL (ref 1500–7800)
Neutrophils Relative %: 55.2 %
Platelets: 284 10*3/uL (ref 140–400)
RBC: 4.86 10*6/uL (ref 4.20–5.80)
RDW: 12 % (ref 11.0–15.0)
Total Lymphocyte: 33.2 %
WBC: 6.8 10*3/uL (ref 3.8–10.8)

## 2019-12-12 LAB — HEPATIC FUNCTION PANEL
AG Ratio: 1.9 (calc) (ref 1.0–2.5)
ALT: 18 U/L (ref 9–46)
AST: 19 U/L (ref 10–35)
Albumin: 4.4 g/dL (ref 3.6–5.1)
Alkaline phosphatase (APISO): 53 U/L (ref 35–144)
Bilirubin, Direct: 0.3 mg/dL — ABNORMAL HIGH (ref 0.0–0.2)
Globulin: 2.3 g/dL (calc) (ref 1.9–3.7)
Indirect Bilirubin: 0.6 mg/dL (calc) (ref 0.2–1.2)
Total Bilirubin: 0.9 mg/dL (ref 0.2–1.2)
Total Protein: 6.7 g/dL (ref 6.1–8.1)

## 2019-12-12 LAB — PSA: PSA: 0.6 ng/mL (ref <=4.0)

## 2019-12-12 LAB — TSH: TSH: 2.2 mIU/L (ref 0.40–4.50)

## 2020-01-02 DIAGNOSIS — M79645 Pain in left finger(s): Secondary | ICD-10-CM | POA: Diagnosis not present

## 2020-01-02 DIAGNOSIS — M79644 Pain in right finger(s): Secondary | ICD-10-CM | POA: Diagnosis not present

## 2020-01-02 DIAGNOSIS — M65311 Trigger thumb, right thumb: Secondary | ICD-10-CM | POA: Diagnosis not present

## 2020-01-02 DIAGNOSIS — M65312 Trigger thumb, left thumb: Secondary | ICD-10-CM | POA: Diagnosis not present

## 2020-02-14 DIAGNOSIS — M65311 Trigger thumb, right thumb: Secondary | ICD-10-CM | POA: Diagnosis not present

## 2020-02-14 DIAGNOSIS — M65312 Trigger thumb, left thumb: Secondary | ICD-10-CM | POA: Diagnosis not present

## 2020-02-26 ENCOUNTER — Telehealth: Payer: Self-pay | Admitting: Pulmonary Disease

## 2020-02-28 NOTE — Telephone Encounter (Signed)
Pt has not been seen at the office since 02/08/18. Pt will need to have a follow up office visit.  Called and spoke with pt letting him know this information and he verbalized understanding. Stated to pt that he could call PCP to see if they will do an immunization without an appt and he verbalized understanding. Nothing further needed.

## 2020-04-18 DIAGNOSIS — H25013 Cortical age-related cataract, bilateral: Secondary | ICD-10-CM | POA: Diagnosis not present

## 2020-04-18 DIAGNOSIS — H5203 Hypermetropia, bilateral: Secondary | ICD-10-CM | POA: Diagnosis not present

## 2020-04-22 DIAGNOSIS — R21 Rash and other nonspecific skin eruption: Secondary | ICD-10-CM | POA: Diagnosis not present

## 2020-04-22 DIAGNOSIS — M2550 Pain in unspecified joint: Secondary | ICD-10-CM | POA: Diagnosis not present

## 2020-04-22 DIAGNOSIS — M7989 Other specified soft tissue disorders: Secondary | ICD-10-CM | POA: Diagnosis not present

## 2020-05-02 DIAGNOSIS — L4059 Other psoriatic arthropathy: Secondary | ICD-10-CM | POA: Diagnosis not present

## 2020-05-02 DIAGNOSIS — M2550 Pain in unspecified joint: Secondary | ICD-10-CM | POA: Diagnosis not present

## 2020-05-02 DIAGNOSIS — R21 Rash and other nonspecific skin eruption: Secondary | ICD-10-CM | POA: Diagnosis not present

## 2020-05-06 ENCOUNTER — Other Ambulatory Visit: Payer: Self-pay

## 2020-05-07 ENCOUNTER — Encounter: Payer: Self-pay | Admitting: Family Medicine

## 2020-05-07 ENCOUNTER — Other Ambulatory Visit: Payer: Self-pay

## 2020-05-07 ENCOUNTER — Ambulatory Visit (INDEPENDENT_AMBULATORY_CARE_PROVIDER_SITE_OTHER): Payer: BC Managed Care – PPO | Admitting: Family Medicine

## 2020-05-07 VITALS — BP 142/79 | HR 77 | Ht 70.0 in | Wt 185.0 lb

## 2020-05-07 DIAGNOSIS — R109 Unspecified abdominal pain: Secondary | ICD-10-CM | POA: Diagnosis not present

## 2020-05-07 NOTE — Patient Instructions (Signed)
We will set up abdominal and pelvic ultrasound to further assess

## 2020-05-07 NOTE — Progress Notes (Signed)
Established Patient Office Visit  Subjective:  Patient ID: Jake [name], male    DOB: 12/16/1960  Age: 59 y.o. MRN: [id number]  CC:  Chief Complaint  Patient presents with  . Follow-up    HPI Jake [name] presents for several month history of right-sided abdominal pain.  Pain has been somewhat intermittent.  Seems to be worse proximally 30 minutes after eating and he describes achy quality.  This is not so much epigastric or right upper quadrant but slightly more inferior in location.  No weight loss.  Appetite stable.  No nausea or vomiting.  No exertional pains.  No chest pain.  No recent fevers or chills.  He had colonoscopy 8/20.  No alleviating factors.  No change in bowel habits.  No dysuria.  Denies any flank pain.  He has had some extensive nail changes.  He was referred to rheumatology and diagnosed with polyarthritis.  He had multiple recent labs which were reviewed.  Rheumatoid factor negative, CCP antibodies negative, sed rate 2, hepatitis B and C studies unremarkable.  C-reactive protein less than 1, uric acid 6.2, comprehensive metabolic panel unremarkable.  Liver transaminases normal.  CBC unremarkable with white count 5.5 thousand hemoglobin 15.3 and platelets 308,000.  Past Medical History:  Diagnosis Date  . Allergy   . Arthritis   . Asthma   . CAD in native artery    a. nonobstructive by cardiac CTA.  Marland Kitchen Coronary artery disease   . GERD (gastroesophageal reflux disease)   . Hyperlipidemia   . Neuromuscular disorder ([place])    carpal tunnel bilateral wrists  . Pulmonary nodules   . Sinus bradycardia     Past Surgical History:  Procedure Laterality Date  . COLONOSCOPY    . INGUINAL HERNIA REPAIR  2009  . LUMBAR LAMINECTOMY  1997  . NECK SURGERY  01/2010   c5-7  . VASECTOMY      Family History  Problem Relation Age of Onset  . Osteoporosis Mother   . Parkinson's disease Father   . Cancer Father   . Alcohol abuse Other   . Asthma Other   .  Depression Other   . Valvular heart disease Other   . Colon cancer Paternal Grandmother 74  . Esophageal cancer Neg Hx   . Stomach cancer Neg Hx   . Rectal cancer Neg Hx     Social History   Socioeconomic History  . Marital status: Married    Spouse name: Jake [name]  . Number of children: 4  . Years of education: BA  . Highest education level: Not on file  Occupational History  . 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 Smoking status: Former Smoker    Packs/day: 0.50    Years: 20.00    Pack years: 10.00    Types: Cigarettes    Quit date: 06/22/1992    Years since quitting: 27.8  . Smokeless tobacco: Never Used  Vaping Use  . Vaping Use: Never used  Substance and Sexual Activity  . Alcohol use: Yes    Alcohol/week: 10.0 standard drinks    Types: 10 Standard drinks or equivalent per week  . Drug use: No  . Sexual activity: Not on file  Other Topics Concern  . Not on file  Social History Narrative   Patient is married Jake [name]) and has four children.   Patient lives at home with his wife and children.   Patient is working  At [employer].  Patient has a BA degree.   Patient used both hands.   Patient drinks three cups of coffee daily.   Social Determinants of Health   Financial Resource Strain:   . Difficulty of Paying Living Expenses: Not on file  Food Insecurity:   . Worried About Charity fundraiser in the Last Year: Not on file  . Ran Out of Food in the Last Year: Not on file  Transportation Needs:   . Lack of Transportation (Medical): Not on file  . Lack of Transportation (Non-Medical): Not on file  Physical Activity:   . Days of Exercise per Week: Not on file  . Minutes of Exercise per Session: Not on file  Stress:   . Feeling of Stress : Not on file  Social Connections:   . Frequency of Communication with Friends and Family: Not on file  . Frequency of Social Gatherings with Friends and Family: Not on file  . Attends Religious  Services: Not on file  . Active Member of Clubs or Organizations: Not on file  . Attends Archivist Meetings: Not on file  . Marital Status: Not on file  Intimate Partner Violence:   . Fear of Current or Ex-Partner: Not on file  . Emotionally Abused: Not on file  . Physically Abused: Not on file  . Sexually Abused: Not on file    Outpatient Medications Prior to Visit  Medication Sig Dispense Refill  . albuterol (VENTOLIN HFA) 108 (90 Base) MCG/ACT inhaler Inhale 2 puffs into the lungs every 6 (six) hours as needed for wheezing or shortness of breath (cough, shortness of breath or wheezing.). 8 g 0  . Cholecalciferol (VITAMIN D3) 25 MCG (1000 UT) CAPS daily.     . clotrimazole (LOTRIMIN) 1 % cream Apply to affected area 2 times daily 30 g 0  . Evolocumab with Infusor ([place]) 420 MG/3.5ML SOCT Inject 1 applicator into the skin every 28 (twenty-eight) days. 10.5 mL 3  . ezetimibe (ZETIA) 10 MG tablet TAKE 1 TABLET ONCE DAILY. 30 tablet 9  . fluticasone (FLONASE) 50 MCG/ACT nasal spray Place 2 sprays into both nostrils as needed for allergies or rhinitis.    . fluticasone (FLOVENT HFA) 44 MCG/ACT inhaler Inhale 2 puffs into the lungs in the morning and at bedtime. 1 Inhaler 12  . Ibuprofen (ADVIL PO) Take by mouth as needed.    . Multiple Vitamins-Minerals (MULTIVITAMIN ADULT PO) daily.     Marland Kitchen 0.9 %  sodium chloride infusion     . 0.9 %  sodium chloride infusion      No facility-administered medications prior to visit.    Allergies  Allergen Reactions  . Latex     REACTION: RASH  . Lipitor [Atorvastatin]     Muscle aches at 20 mg    . Other Rash    Wool pants caused rash   . Zocor [Simvastatin]     Muscle aches at 20 mg daily  . Crestor [Rosuvastatin Calcium]     Muscle aches  . Gluten Meal     Digestive symptoms    ROS Review of Systems  Constitutional: Negative for chills and fever.  Respiratory: Negative for cough and shortness of breath.    Cardiovascular: Negative for chest pain.  Gastrointestinal: Positive for abdominal pain. Negative for blood in stool, constipation, diarrhea, nausea and vomiting.  Genitourinary: Negative for dysuria.  Musculoskeletal: Negative for back pain.  Skin: Negative for rash.  Hematological: Negative for adenopathy.  Objective:    Physical Exam Vitals reviewed.  Constitutional:      Appearance: Normal appearance.  Cardiovascular:     Rate and Rhythm: Normal rate and regular rhythm.  Pulmonary:     Effort: Pulmonary effort is normal.     Breath sounds: Normal breath sounds.  Abdominal:     Palpations: Abdomen is soft. There is no mass.     Tenderness: There is no abdominal tenderness. There is no guarding or rebound.     Comments: Does have a small ventral hernia but this is nontender  Skin:    Findings: No rash.  Neurological:     Mental Status: He is alert.     BP (!) 142/79   Pulse 77   Ht 5\' 10"  (1.778 m)   Wt 185 lb (83.9 kg)   BMI 26.54 kg/m  Wt Readings from Last 3 Encounters:  05/07/20 185 lb (83.9 kg)  12/11/19 179 lb 1.6 oz (81.2 kg)  11/14/19 179 lb 3.2 oz (81.3 kg)     Health Maintenance Due  Topic Date Due  . HIV Screening  Never done  . INFLUENZA VACCINE  12/31/2019    There are no preventive care reminders to display for this patient.  Lab Results  Component Value Date   TSH 2.20 12/11/2019   Lab Results  Component Value Date   WBC 6.8 12/11/2019   HGB 15.3 12/11/2019   HCT 45.0 12/11/2019   MCV 92.6 12/11/2019   PLT 284 12/11/2019   Lab Results  Component Value Date   NA 140 12/11/2019   K 3.8 12/11/2019   CO2 27 12/11/2019   GLUCOSE 95 12/11/2019   BUN 10 12/11/2019   CREATININE 0.96 12/11/2019   BILITOT 0.9 12/11/2019   ALKPHOS 59 01/18/2019   AST 19 12/11/2019   ALT 18 12/11/2019   PROT 6.7 12/11/2019   ALBUMIN 4.4 01/18/2019   CALCIUM 9.1 12/11/2019   ANIONGAP 12 01/18/2019   GFR 88.80 07/04/2014   Lab Results   Component Value Date   CHOL 115 06/26/2019   Lab Results  Component Value Date   HDL 68 06/26/2019   Lab Results  Component Value Date   LDLCALC 30 06/26/2019   Lab Results  Component Value Date   TRIG 92 06/26/2019   Lab Results  Component Value Date   CHOLHDL 1.7 06/26/2019   No results found for: HGBA1C    Assessment & Plan:   Several month history of right-sided abdominal pain.  This is around the right mid abdominal quadrant and is inferior to the liver and gallbladder region.  Benign exam at this time.  He does not have any red flag symptoms such as weight loss, appetite loss, fever, etc.  Multiple recent labs as above reviewed.  -Colonoscopy up-to-date -Set up abdominal pelvic ultrasound to further assess.  No orders of the defined types were placed in this encounter.   Follow-up: No follow-ups on file.    Carolann Littler, MD

## 2020-05-22 ENCOUNTER — Ambulatory Visit
Admission: RE | Admit: 2020-05-22 | Discharge: 2020-05-22 | Disposition: A | Discharge status: Home / Self Care | Payer: BC Managed Care – PPO | Source: Ambulatory Visit | Attending: Family Medicine | Admitting: Family Medicine

## 2020-05-22 DIAGNOSIS — N281 Cyst of kidney, acquired: Secondary | ICD-10-CM | POA: Diagnosis not present

## 2020-05-22 DIAGNOSIS — R109 Unspecified abdominal pain: Secondary | ICD-10-CM

## 2020-05-27 ENCOUNTER — Other Ambulatory Visit: Payer: Self-pay | Admitting: Family Medicine

## 2020-05-29 ENCOUNTER — Other Ambulatory Visit: Payer: BC Managed Care – PPO

## 2020-06-07 ENCOUNTER — Telehealth: Payer: Self-pay | Admitting: Cardiology

## 2020-06-07 DIAGNOSIS — I2510 Atherosclerotic heart disease of native coronary artery without angina pectoris: Secondary | ICD-10-CM

## 2020-06-07 DIAGNOSIS — Z8679 Personal history of other diseases of the circulatory system: Secondary | ICD-10-CM

## 2020-06-07 DIAGNOSIS — E7801 Familial hypercholesterolemia: Secondary | ICD-10-CM

## 2020-06-07 NOTE — Telephone Encounter (Signed)
Patient requesting lab work to check his cholesterol. He would like a call back.

## 2020-06-07 NOTE — Telephone Encounter (Signed)
Pt has follow up appt with Gerrianne Scale PA 07/10/20 and would like fasting lipids prior to his appt... will forward for order.   Last Lipid panel 06/26/19 Last Hepatic panel 12/11/19

## 2020-06-10 NOTE — Telephone Encounter (Signed)
Please order FLP, cmet and cbc prior to his appt. thanks

## 2020-06-11 DIAGNOSIS — L308 Other specified dermatitis: Secondary | ICD-10-CM | POA: Diagnosis not present

## 2020-06-11 DIAGNOSIS — Z85828 Personal history of other malignant neoplasm of skin: Secondary | ICD-10-CM | POA: Diagnosis not present

## 2020-06-11 DIAGNOSIS — D1801 Hemangioma of skin and subcutaneous tissue: Secondary | ICD-10-CM | POA: Diagnosis not present

## 2020-06-11 DIAGNOSIS — D2262 Melanocytic nevi of left upper limb, including shoulder: Secondary | ICD-10-CM | POA: Diagnosis not present

## 2020-06-13 ENCOUNTER — Other Ambulatory Visit: Payer: Self-pay

## 2020-06-13 ENCOUNTER — Other Ambulatory Visit: Payer: BC Managed Care – PPO | Admitting: *Deleted

## 2020-06-13 DIAGNOSIS — I2510 Atherosclerotic heart disease of native coronary artery without angina pectoris: Secondary | ICD-10-CM | POA: Diagnosis not present

## 2020-06-13 DIAGNOSIS — E7801 Familial hypercholesterolemia: Secondary | ICD-10-CM | POA: Diagnosis not present

## 2020-06-13 DIAGNOSIS — Z8679 Personal history of other diseases of the circulatory system: Secondary | ICD-10-CM | POA: Diagnosis not present

## 2020-06-13 DIAGNOSIS — I2583 Coronary atherosclerosis due to lipid rich plaque: Secondary | ICD-10-CM | POA: Diagnosis not present

## 2020-06-14 LAB — CBC
Hematocrit: 43.3 % (ref 37.5–51.0)
Hemoglobin: 15.2 g/dL (ref 13.0–17.7)
MCH: 30.7 pg (ref 26.6–33.0)
MCHC: 35.1 g/dL (ref 31.5–35.7)
MCV: 88 fL (ref 79–97)
Platelets: 265 10*3/uL (ref 150–450)
RBC: 4.95 x10E6/uL (ref 4.14–5.80)
RDW: 11.6 % (ref 11.6–15.4)
WBC: 5.7 10*3/uL (ref 3.4–10.8)

## 2020-06-14 LAB — COMPREHENSIVE METABOLIC PANEL
ALT: 17 IU/L (ref 0–44)
AST: 20 IU/L (ref 0–40)
Albumin/Globulin Ratio: 1.8 (ref 1.2–2.2)
Albumin: 4.3 g/dL (ref 3.8–4.9)
Alkaline Phosphatase: 69 IU/L (ref 44–121)
BUN/Creatinine Ratio: 10 (ref 10–24)
BUN: 10 mg/dL (ref 8–27)
Bilirubin Total: 0.6 mg/dL (ref 0.0–1.2)
CO2: 26 mmol/L (ref 20–29)
Calcium: 8.8 mg/dL (ref 8.6–10.2)
Chloride: 103 mmol/L (ref 96–106)
Creatinine, Ser: 0.97 mg/dL (ref 0.76–1.27)
GFR calc Af Amer: 98 mL/min/{1.73_m2} (ref >59)
GFR calc non Af Amer: 84 mL/min/{1.73_m2} (ref >59)
Globulin, Total: 2.4 g/dL (ref 1.5–4.5)
Glucose: 100 mg/dL — ABNORMAL HIGH (ref 65–99)
Potassium: 4.2 mmol/L (ref 3.5–5.2)
Sodium: 143 mmol/L (ref 134–144)
Total Protein: 6.7 g/dL (ref 6.0–8.5)

## 2020-06-14 LAB — LIPID PANEL
Chol/HDL Ratio: 2.9 ratio (ref 0.0–5.0)
Cholesterol, Total: 158 mg/dL (ref 100–199)
HDL: 54 mg/dL (ref >39)
LDL Chol Calc (NIH): 83 mg/dL (ref 0–99)
Triglycerides: 115 mg/dL (ref 0–149)
VLDL Cholesterol Cal: 21 mg/dL (ref 5–40)

## 2020-06-26 NOTE — Progress Notes (Signed)
Cardiology Office Note    Date:  07/10/2020   ID:  Jake [name], DOB [birth year], MRN [id number]  PCP:  Eulas Post, MD  Cardiologist: Ena Dawley, MD EPS: None  Chief Complaint  Patient presents with  . Follow-up    History of Present Illness:  Jake [name] is a 60 y.o. male with history of nonobstructive CAD by cath in 2014, cardiac CTA 04/2013 nonobstructive CAD, possible pericarditis 2014, sinus bradycardia, HLD on Zetia and Repatha, first-degree A-V block.  Cardiac CTA 10/19/2017 nonobstructive CAD ostial RI 50 to 69% sent for FFR which confirm no hemodynamically significant stenosis.   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  He was having occasional pain that he felt was related to asthma was going to pay closer attention to it.  Patient comes in for yearly f/u. Stopped repatha in Oct. Had some aching in hands treated with prednisone for arthritis. Saw a rheumatologist and was given methotrexate and reluctant to take it. Also having intermittent right abdominal pain and had an ultrasound that was fine. Doesn't feel muscular. Still doing a lot of pottery, tree grower, and beekeeping. Just started back with HIIT training 3 times a week. Occasionally gets a pinching pain but nothing he becomes concerned about.  Past Medical History:  Diagnosis Date  . Allergy   . Arthritis   . Asthma   . CAD in native artery    a. nonobstructive by cardiac CTA.  Marland Kitchen Coronary artery disease   . GERD (gastroesophageal reflux disease)   . Hyperlipidemia   . Neuromuscular disorder ([place])    carpal tunnel bilateral wrists  . Pulmonary nodules   . Sinus bradycardia     Past Surgical History:  Procedure Laterality Date  . COLONOSCOPY    . INGUINAL HERNIA REPAIR  2009  . LUMBAR LAMINECTOMY  1997  . NECK SURGERY  01/2010   c5-7  . VASECTOMY      Current Medications: Current Meds  Medication  Sig  . albuterol (VENTOLIN HFA) 108 (90 Base) MCG/ACT inhaler INHALE 2 PUFFS EVERY 6 HOURS AS NEEDED FOR COUGH, WHEEZE, SHORTNESS OF BREATH.  Marland Kitchen Cholecalciferol (VITAMIN D3) 25 MCG (1000 UT) CAPS daily.   Marland Kitchen ezetimibe (ZETIA) 10 MG tablet TAKE 1 TABLET ONCE DAILY.  . fluticasone (FLONASE) 50 MCG/ACT nasal spray Place 2 sprays into both nostrils as needed for allergies or rhinitis.  . fluticasone (FLOVENT HFA) 44 MCG/ACT inhaler Inhale 2 puffs into the lungs in the morning and at bedtime.  . Ibuprofen (ADVIL PO) Take by mouth as needed.  . Multiple Vitamins-Minerals (MULTIVITAMIN ADULT PO) daily.   . sildenafil (VIAGRA) 100 MG tablet Take by mouth.  . [DISCONTINUED] Evolocumab with Infusor ([place]) 420 MG/3.5ML SOCT Inject 1 applicator into the skin every 28 (twenty-eight) days.     Allergies:   Latex, Lipitor [atorvastatin], Other, Zocor [simvastatin], Crestor [rosuvastatin calcium], and Gluten meal   Social History   Socioeconomic History  . Marital status: Married    Spouse name: [name]  . Number of children: 4  . Years of education: BA  . Highest education level: Not on file  Occupational History  . 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 Smoking status: Former Smoker    Packs/day: 0.50    Years: 20.00    Pack years: 10.00    Types: Cigarettes    Quit date:  06/22/1992    Years since quitting: 28.0  . Smokeless tobacco: Never Used  Vaping Use  . Vaping Use: Never used  Substance and Sexual Activity  . Alcohol use: Yes    Alcohol/week: 10.0 standard drinks    Types: 10 Standard drinks or equivalent per week  . Drug use: No  . Sexual activity: Not on file  Other Topics Concern  . Not on file  Social History Narrative   Patient is married [name]) and has four children.   Patient lives at home with his wife and children.   Patient is working  At [employer].   Patient has a BA degree.   Patient used both hands.   Patient drinks  three cups of coffee daily.   Social Determinants of Health   Financial Resource Strain: Not on file  Food Insecurity: Not on file  Transportation Needs: Not on file  Physical Activity: Not on file  Stress: Not on file  Social Connections: Not on file     Family History:  The patient's family history includes Alcohol abuse in an other family member; Asthma in an other family member; Cancer in his father; Colon cancer (age of onset: 52) in his paternal grandmother; Depression in an other family member; Osteoporosis in his mother; Parkinson's disease in his father; Valvular heart disease in an other family member.   ROS:   Please see the history of present illness.    ROS All other systems reviewed and are negative.   PHYSICAL EXAM:   VS:  BP (!) 148/82   Pulse 80   Ht 5\' 10"  (1.778 m)   Wt 185 lb 9.6 oz (84.2 kg)   SpO2 95%   BMI 26.63 kg/m   Physical Exam  GEN: Well nourished, well developed, in no acute distress  Neck: no JVD, carotid bruits, or masses Cardiac:RRR; no murmurs, rubs, or gallops  Respiratory:  clear to auscultation bilaterally, normal work of breathing GI: soft, nontender, nondistended, + BS Ext: without cyanosis, clubbing, or edema, Good distal pulses bilaterally Neuro:  Alert and Oriented x 3 Psych: euthymic mood, full affect  Wt Readings from Last 3 Encounters:  07/10/20 185 lb 9.6 oz (84.2 kg)  05/07/20 185 lb (83.9 kg)  12/11/19 179 lb 1.6 oz (81.2 kg)      Studies/Labs Reviewed:   EKG:  EKG is ordered today.  The ekg ordered today demonstrates NSR no acute change  Recent Labs: 12/11/2019: TSH 2.20 06/13/2020: ALT 17; BUN 10; Creatinine, Ser 0.97; Hemoglobin 15.2; Platelets 265; Potassium 4.2; Sodium 143   Lipid Panel    Component Value Date/Time   CHOL 158 06/13/2020 0850   CHOL 101 (L) 12/24/2015 0750   CHOL 166 10/02/2014 0805   TRIG 115 06/13/2020 0850   TRIG 77 12/24/2015 0750   TRIG 97 10/02/2014 0805   HDL 54 06/13/2020 0850    HDL 66 12/24/2015 0750   HDL 50 10/02/2014 0805   CHOLHDL 2.9 06/13/2020 0850   CHOLHDL 1.5 12/24/2015 0750   CHOLHDL 5 05/08/2013 0754   VLDL 27.2 05/08/2013 0754   LDLCALC 83 06/13/2020 0850   LDLCALC 20 12/24/2015 0750   LDLCALC 97 10/02/2014 0805   LDLDIRECT 135.0 05/08/2013 0754    Additional studies/ records that were reviewed today include:  IMPRESSION: 1. Coronary calcium score of 120. This was 48 percentile for age and sex matched control. In 2014 calcium score was 40 and represented 80 percentile for age/sex.   2.  Normal coronary origin with right dominance.   3. When compared to the prior study from 2014 there is mild progression of CAD with mild worsening of the proximal LAD lesion and ostial ramus intermedius lesion, still non-obstructive. We will obtain additional CT FFR analysis.     Electronically Signed   By: Ena Dawley   On: 10/19/2017 15:20      Risk Assessment/Calculations:         ASSESSMENT:    1. Coronary artery disease involving native coronary artery of native heart without angina pectoris   2. Other hyperlipidemia   3. History of pericarditis      PLAN:  In order of problems listed above:  CAD nonobstructive on coronary CTA 10/2017 calcium score 120 which is 78th percentile for age and sex match control, nonobstructive CAD and FFR confirmed this. History of pericarditis-no recurrence. Has occasional chest pain with high intensity activity eases quickly with rest. Continue current treatment   Hyperlipidemia stopped Repatha but on Zetia  LDL 83 06/13/20. Discussed with pharmacy and will switch to Praluent.     Shared Decision Making/Informed Consent        Medication Adjustments/Labs and Tests Ordered: Current medicines are reviewed at length with the patient today.  Concerns regarding medicines are outlined above.  Medication changes, Labs and Tests ordered today are listed in the Patient Instructions below. Patient  Instructions  Medication Instructions:  Your physician has recommended you make the following change in your medication:   STOP: Repatha START: Praluent  *If you need a refill on your cardiac medications before your next appointment, please call your pharmacy*   Lab Work: None Today If you have labs (blood work) drawn today and your tests are completely normal, you will receive your results only by: Marland Kitchen MyChart Message (if you have MyChart) OR . A paper copy in the mail If you have any lab test that is abnormal or we need to change your treatment, we will call you to review the results.   Follow-Up: At [facility], you and your health needs are our priority.  As part of our continuing mission to provide you with exceptional heart care, we have created designated Provider Care Teams.  These Care Teams include your primary Cardiologist (physician) and Advanced Practice Providers (APPs -  Physician Assistants and Nurse Practitioners) who all work together to provide you with the care you need, when you need it.  Your next appointment:   9 month(s)  The format for your next appointment:   In Person  Provider:   Gwyndolyn Kaufman, MD      Signed, Ermalinda Barrios, PA-C  07/10/2020 9:04 AM    [facility] [address], [place], [place]  [address] Phone: [phone]; Fax: [phone]

## 2020-07-10 ENCOUNTER — Encounter: Payer: Self-pay | Admitting: Physician Assistant

## 2020-07-10 ENCOUNTER — Ambulatory Visit (INDEPENDENT_AMBULATORY_CARE_PROVIDER_SITE_OTHER): Payer: BC Managed Care – PPO | Admitting: Physician Assistant

## 2020-07-10 ENCOUNTER — Telehealth: Payer: Self-pay | Admitting: Pharmacist

## 2020-07-10 ENCOUNTER — Other Ambulatory Visit: Payer: Self-pay

## 2020-07-10 VITALS — BP 148/82 | HR 80 | Ht 70.0 in | Wt 185.6 lb

## 2020-07-10 DIAGNOSIS — E7849 Other hyperlipidemia: Secondary | ICD-10-CM

## 2020-07-10 DIAGNOSIS — I2510 Atherosclerotic heart disease of native coronary artery without angina pectoris: Secondary | ICD-10-CM | POA: Diagnosis not present

## 2020-07-10 DIAGNOSIS — Z8679 Personal history of other diseases of the circulatory system: Secondary | ICD-10-CM

## 2020-07-10 NOTE — Patient Instructions (Addendum)
Medication Instructions:  Your physician has recommended you make the following change in your medication:   STOP: Repatha START: Praluent  *If you need a refill on your cardiac medications before your next appointment, please call your pharmacy*   Lab Work: None Today If you have labs (blood work) drawn today and your tests are completely normal, you will receive your results only by: Marland Kitchen MyChart Message (if you have MyChart) OR . A paper copy in the mail If you have any lab test that is abnormal or we need to change your treatment, we will call you to review the results.   Follow-Up: At [facility], you and your health needs are our priority.  As part of our continuing mission to provide you with exceptional heart care, we have created designated Provider Care Teams.  These Care Teams include your primary Cardiologist (physician) and Advanced Practice Providers (APPs -  Physician Assistants and Nurse Practitioners) who all work together to provide you with the care you need, when you need it.  Your next appointment:   9 month(s)  The format for your next appointment:   In Person  Provider:   Gwyndolyn Kaufman, MD

## 2020-07-10 NOTE — Telephone Encounter (Addendum)
Curb side consulted today regarding pt's Repatha injections. He stopped these 3 months ago due to injection site reactions associated with monthly auto-infusor. He cannot take pre-filled pen formulation since it contains latex and he has a latex allergy.   He is previously intolerant to atorvastatin 20mg  daily, simvastatin 20mg  daily, rosuvastatin 5mg  once weekly (myalgias with all). He's currently tolerating ezetimibe 10mg  daily but LDL from 3 weeks ago is 83 above goal < 70 given history of CAD.  Will submit authorization to change pt to Praluent. Pt will qualify for $25 copay card.

## 2020-07-11 MED ORDER — PRALUENT 75 MG/ML ~~LOC~~ SOAJ: 1.0000 "pen " | SUBCUTANEOUS | 11 refills | Status: DC

## 2020-07-11 NOTE — Telephone Encounter (Signed)
Praluent prior auth approved through 07/09/21. Rx sent to pharmacy along with $25/month copay card. Called pt who is aware to start Praluent 75mg  injection every 2 weeks. Scheduled f/u lab work in 2 months to assess efficacy.

## 2020-07-12 NOTE — Telephone Encounter (Signed)
Thank you :)

## 2020-07-17 ENCOUNTER — Encounter: Payer: Self-pay | Admitting: Gastroenterology

## 2020-07-17 ENCOUNTER — Ambulatory Visit (INDEPENDENT_AMBULATORY_CARE_PROVIDER_SITE_OTHER): Payer: BC Managed Care – PPO | Admitting: Gastroenterology

## 2020-07-17 VITALS — BP 126/78 | HR 88 | Ht 70.0 in | Wt 186.0 lb

## 2020-07-17 DIAGNOSIS — R1011 Right upper quadrant pain: Secondary | ICD-10-CM | POA: Diagnosis not present

## 2020-07-17 MED ORDER — OMEPRAZOLE 40 MG PO CPDR: DELAYED_RELEASE_CAPSULE | ORAL | 3 refills | Status: DC

## 2020-07-17 NOTE — Progress Notes (Signed)
Review of pertinent gastrointestinal problems: 1.  History of adenomatous colon polyps.  Colonoscopy 2007 1 hyperplastic polyp.  Colonoscopy 2017 four subcentimeter polyps, 2 of them were tubular adenomas and one was a sessile serrated polyp.  Colonoscopy 12/2018 2 mm polyp was removed.  It was a sessile serrated polyp and he was recommended to have repeat colonoscopy at 5-year interval.  HPI: This is a very pleasant 60 year old man whom I last saw the time of a colonoscopy about a year and a half ago.  I last saw him at the time of a colonoscopy August 2020.  See those results summarized above.  Today he is here today for a new problem.  He has had right upper quadrant sharp achiness for the past several months.  This is often postprandial.  It can last 10 minutes to several hours.  He does not have nausea at the same time.  Moving his bowels does not impact the discomfort.  He does intermittently have heartburn.  He takes NSAIDs rarely.  He intermittently will have dysphagia and will sometimes even choke on his saliva.  He is currently on a prednisone taper for some hand pains.  He takes this same prednisone taper about 4 or 5 times in the past 3 years.  He is not on any antiacid medicines.  No overt GI bleeding.  His weight is overall stable   Old Data Reviewed: Complete abdominal ultrasound December 2021 showed bilateral renal cyst but was otherwise completely normal.  No stones in his gallbladder.  Lab work January 2022 CBC was normal, complete metabolic profile was normal  Review of systems: Pertinent positive and negative review of systems were noted in the above HPI section. All other review negative.   Past Medical History:  Diagnosis Date  . Allergy   . Arthritis   . Asthma   . CAD in native artery    a. nonobstructive by cardiac CTA.  Marland Kitchen Coronary artery disease   . GERD (gastroesophageal reflux disease)   . Hyperlipidemia   . Neuromuscular disorder ([place])    carpal tunnel  bilateral wrists  . Pulmonary nodules   . Sinus bradycardia     Past Surgical History:  Procedure Laterality Date  . COLONOSCOPY    . INGUINAL HERNIA REPAIR  2009  . LUMBAR LAMINECTOMY  1997  . NECK SURGERY  01/2010   c5-7  . VASECTOMY      Current Outpatient Medications  Medication Sig Dispense Refill  . albuterol (VENTOLIN HFA) 108 (90 Base) MCG/ACT inhaler INHALE 2 PUFFS EVERY 6 HOURS AS NEEDED FOR COUGH, WHEEZE, SHORTNESS OF BREATH. 8.5 g 0  . Alirocumab (PRALUENT) 75 MG/ML SOAJ Inject 1 pen into the skin every 14 (fourteen) days. 2 mL 11  . Cholecalciferol (VITAMIN D3) 25 MCG (1000 UT) CAPS daily.     Marland Kitchen ezetimibe (ZETIA) 10 MG tablet TAKE 1 TABLET ONCE DAILY. 30 tablet 9  . fluticasone (FLONASE) 50 MCG/ACT nasal spray Place 2 sprays into both nostrils as needed for allergies or rhinitis.    . fluticasone (FLOVENT HFA) 44 MCG/ACT inhaler Inhale 2 puffs into the lungs in the morning and at bedtime. 1 Inhaler 12  . Ibuprofen (ADVIL PO) Take by mouth as needed.    . Multiple Vitamins-Minerals (MULTIVITAMIN ADULT PO) daily.     . sildenafil (VIAGRA) 100 MG tablet Take by mouth.     No current facility-administered medications for this visit.    Allergies as of 07/17/2020 - Review Complete 07/17/2020  Allergen Reaction Noted  . Latex  03/01/2007  . Lipitor [atorvastatin]  11/21/2013  . Other Rash 08/15/2013  . Zocor [simvastatin]  11/21/2013  . Crestor [rosuvastatin calcium]  01/12/2019  . Gluten meal  01/25/2019    Family History  Problem Relation Age of Onset  . Osteoporosis Mother   . Parkinson's disease Father   . Cancer Father   . Alcohol abuse Other   . Asthma Other   . Depression Other   . Valvular heart disease Other   . Colon cancer Paternal Grandmother 45  . Esophageal cancer Neg Hx   . Stomach cancer Neg Hx   . Rectal cancer Neg Hx     Social History   Socioeconomic History  . Marital status: Married    Spouse name: [name]  . Number of  children: 4  . Years of education: BA  . Highest education level: Not on file  Occupational History  . 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 Smoking status: Former Smoker    Packs/day: 0.50    Years: 20.00    Pack years: 10.00    Types: Cigarettes    Quit date: 06/22/1992    Years since quitting: 28.0  . Smokeless tobacco: Never Used  Vaping Use  . Vaping Use: Never used  Substance and Sexual Activity  . Alcohol use: Yes    Alcohol/week: 10.0 standard drinks    Types: 10 Standard drinks or equivalent per week  . Drug use: No  . Sexual activity: Not on file  Other Topics Concern  . Not on file  Social History Narrative   Patient is married [name]) and has four children.   Patient lives at home with his wife and children.   Patient is working  At [employer].   Patient has a BA degree.   Patient used both hands.   Patient drinks three cups of coffee daily.   Social Determinants of Health   Financial Resource Strain: Not on file  Food Insecurity: Not on file  Transportation Needs: Not on file  Physical Activity: Not on file  Stress: Not on file  Social Connections: Not on file  Intimate Partner Violence: Not on file     Physical Exam: Ht 5\' 10"  (1.778 m)   Wt 186 lb (84.4 kg)   BMI 26.69 kg/m  Constitutional: generally well-appearing Psychiatric: alert and oriented x3 Eyes: extraocular movements intact Mouth: oral pharynx moist, no lesions Neck: supple no lymphadenopathy Cardiovascular: heart regular rate and rhythm Lungs: clear to auscultation bilaterally Abdomen: soft, nontender, nondistended, no obvious ascites, no peritoneal signs, normal bowel sounds, positive diastases recti midline bulge Extremities: no lower extremity edema bilaterally Skin: no lesions on visible extremities   Assessment and plan: 60 y.o. male with right upper quadrant discomfort, mild GERD, intermittent dysphagia  I explained to him that his symptoms are not  classic for any particular GI issue and we will have to do some testing to help sort them out.  I recommended EGD at his soonest convenience especially given his mild pyrosis and intermittent mild dysphagia.  He will start omeprazole 40 mg pills 1 pill once daily shortly before his breakfast meal and I will call him in a new prescription for that with 3 refills for now.  If the above is not helpful then I will likely arrange HIDA scan of his gallbladder to estimate his gallbladder ejection fraction to see if this is possibly biliary dyskinesia.  Please see  the "Patient Instructions" section for addition details about the plan.   Owens Loffler, MD [facility] 07/17/2020, 3:26 PM  Cc: Eulas Post, MD  Total time on date of encounter was 45  minutes (this included time spent preparing to see the patient reviewing records; obtaining and/or reviewing separately obtained history; performing a medically appropriate exam and/or evaluation; counseling and educating the patient and family if present; ordering medications, tests or procedures if applicable; and documenting clinical information in the health record).

## 2020-07-17 NOTE — Patient Instructions (Signed)
If you are age 61 or younger, your body mass index should be between 19-25. Your Body mass index is 26.69 kg/m. If this is out of the aformentioned range listed, please consider follow up with your Primary Care Provider.   You have been scheduled for an endoscopy. Please follow written instructions given to you at your visit today. If you use inhalers (even only as needed), please bring them with you on the day of your procedure.  Due to recent changes in healthcare laws, you may see the results of your imaging and laboratory studies on MyChart before your provider has had a chance to review them.  We understand that in some cases there may be results that are confusing or concerning to you. Not all laboratory results come back in the same time frame and the provider may be waiting for multiple results in order to interpret others.  Please give [place] 48 hours in order for your provider to thoroughly review all the results before contacting the office for clarification of your results.   We have sent the following medications to your pharmacy for you to pick up at your convenience:  START: Omeprazole 40mg  take one capsule shortly before breakfast meal each day.  Thank you for entrusting me with your care and choosing [facility]  Dr Ardis Hughs

## 2020-07-23 ENCOUNTER — Other Ambulatory Visit: Payer: Self-pay

## 2020-07-23 ENCOUNTER — Encounter: Payer: Self-pay | Admitting: Gastroenterology

## 2020-07-23 ENCOUNTER — Ambulatory Visit (AMBULATORY_SURGERY_CENTER): Payer: BC Managed Care – PPO | Admitting: Gastroenterology

## 2020-07-23 VITALS — BP 115/74 | HR 69 | Temp 97.9°F | Resp 10 | Ht 70.0 in | Wt 186.0 lb

## 2020-07-23 DIAGNOSIS — K209 Esophagitis, unspecified without bleeding: Secondary | ICD-10-CM

## 2020-07-23 DIAGNOSIS — K2100 Gastro-esophageal reflux disease with esophagitis, without bleeding: Secondary | ICD-10-CM

## 2020-07-23 DIAGNOSIS — K219 Gastro-esophageal reflux disease without esophagitis: Secondary | ICD-10-CM | POA: Diagnosis not present

## 2020-07-23 DIAGNOSIS — R1011 Right upper quadrant pain: Secondary | ICD-10-CM

## 2020-07-23 DIAGNOSIS — K2970 Gastritis, unspecified, without bleeding: Secondary | ICD-10-CM | POA: Diagnosis not present

## 2020-07-23 DIAGNOSIS — K2951 Unspecified chronic gastritis with bleeding: Secondary | ICD-10-CM | POA: Diagnosis not present

## 2020-07-23 MED ORDER — SODIUM CHLORIDE 0.9 % IV SOLN: 500.0000 mL | Freq: Once | INTRAVENOUS | Status: DC

## 2020-07-23 NOTE — Progress Notes (Signed)
VS by CW. ?

## 2020-07-23 NOTE — Progress Notes (Signed)
Called to room to assist during endoscopic procedure.  Patient ID and intended procedure confirmed with present staff. Received instructions for my participation in the procedure from the performing physician.  

## 2020-07-23 NOTE — Op Note (Signed)
[facility] Patient Name: Jake [name] Procedure Date: 07/23/2020 10:27 AM MRN: [id number] Endoscopist: Milus Banister , MD Age: 60 Referring MD:  Date of Birth: 1961/02/07 Gender: Male Account #: [id number] Procedure:                Upper GI endoscopy Indications:              Abdominal pain in the right upper quadrant,                            Dysphagia Medicines:                Monitored Anesthesia Care Procedure:                Pre-Anesthesia Assessment:                           - Prior to the procedure, a History and Physical                            was performed, and patient medications and                            allergies were reviewed. The patient's tolerance of                            previous anesthesia was also reviewed. The risks                            and benefits of the procedure and the sedation                            options and risks were discussed with the patient.                            All questions were answered, and informed consent                            was obtained. Prior Anticoagulants: The patient has                            taken no previous anticoagulant or antiplatelet                            agents. ASA Grade Assessment: II - A patient with                            mild systemic disease. After reviewing the risks                            and benefits, the patient was deemed in                            satisfactory condition to undergo the procedure.  After obtaining informed consent, the endoscope was                            passed under direct vision. Throughout the                            procedure, the patient's blood pressure, pulse, and                            oxygen saturations were monitored continuously. The                            Endoscope was introduced through the mouth, and                            advanced to the second part of duodenum. The upper                             GI endoscopy was accomplished without difficulty.                            The patient tolerated the procedure well. Scope In: Scope Out: Findings:                 LA Grade A reflux related distal esophagitis.                           Mild inflammation characterized by erythema,                            friability and granularity was found in the gastric                            antrum. Biopsies were taken with a cold forceps for                            histology.                           The exam was otherwise without abnormality. Complications:            No immediate complications. Estimated blood loss:                            None. Estimated Blood Loss:     Estimated blood loss: none. Impression:               - LA Grade A reflux related distal esophagitis.                           - Mild, non-specific gastritis, biopsied to check                            for H. pylori.                           -  The examination was otherwise normal. Recommendation:           - Patient has a contact number available for                            emergencies. The signs and symptoms of potential                            delayed complications were discussed with the                            patient. Return to normal activities tomorrow.                            Written discharge instructions were provided to the                            patient.                           - Resume previous diet.                           - Continue present medications.                           - Await pathology results. If biopsies are negative                            for H. pylori then will likely arrange HIDA scan                            with EF to check for gallbladder dyskinesia. Milus Banister, MD 07/23/2020 10:43:58 AM This report has been signed electronically.

## 2020-07-23 NOTE — Progress Notes (Signed)
Report to PACU, RN, vss, BBS= Clear.  

## 2020-07-23 NOTE — Patient Instructions (Signed)
Handouts Provided:  Esophagitis and Gastritis  YOU HAD AN ENDOSCOPIC PROCEDURE TODAY AT [facility]:   Refer to the procedure report that was given to you for any specific questions about what was found during the examination.  If the procedure report does not answer your questions, please call your gastroenterologist to clarify.  If you requested that your care partner not be given the details of your procedure findings, then the procedure report has been included in a sealed envelope for you to review at your convenience later.  YOU SHOULD EXPECT: Some feelings of bloating in the abdomen. Passage of more gas than usual.  Walking can help get rid of the air that was put into your GI tract during the procedure and reduce the bloating. If you had a lower endoscopy (such as a colonoscopy or flexible sigmoidoscopy) you may notice spotting of blood in your stool or on the toilet paper. If you underwent a bowel prep for your procedure, you may not have a normal bowel movement for a few days.  Please Note:  You might notice some irritation and congestion in your nose or some drainage.  This is from the oxygen used during your procedure.  There is no need for concern and it should clear up in a day or so.  SYMPTOMS TO REPORT IMMEDIATELY:   Following upper endoscopy (EGD)  Vomiting of blood or coffee ground material  New chest pain or pain under the shoulder blades  Painful or persistently difficult swallowing  New shortness of breath  Fever of 100F or higher  Black, tarry-looking stools  For urgent or emergent issues, a gastroenterologist can be reached at any hour by calling [phone]. Do not use MyChart messaging for urgent concerns.    DIET:  We do recommend a small meal at first, but then you may proceed to your regular diet.  Drink plenty of fluids but you should avoid alcoholic beverages for 24 hours.  ACTIVITY:  You should plan to take it easy for the rest of today and  you should NOT DRIVE or use heavy machinery until tomorrow (because of the sedation medicines used during the test).    FOLLOW UP: Our staff will call the number listed on your records 48-72 hours following your procedure to check on you and address any questions or concerns that you may have regarding the information given to you following your procedure. If we do not reach you, we will leave a message.  We will attempt to reach you two times.  During this call, we will ask if you have developed any symptoms of COVID 19. If you develop any symptoms (ie: fever, flu-like symptoms, shortness of breath, cough etc.) before then, please call [phone].  If you test positive for Covid 19 in the 2 weeks post procedure, please call and report this information to [place].    If any biopsies were taken you will be contacted by phone or by letter within the next 1-3 weeks.  Please call us at [phone] if you have not heard about the biopsies in 3 weeks.    SIGNATURES/CONFIDENTIALITY: You and/or your care partner have signed paperwork which will be entered into your electronic medical record.  These signatures attest to the fact that that the information above on your After Visit Summary has been reviewed and is understood.  Full responsibility of the confidentiality of this discharge information lies with you and/or your care-partner.

## 2020-07-24 ENCOUNTER — Other Ambulatory Visit: Payer: Self-pay | Admitting: Physician Assistant

## 2020-07-25 ENCOUNTER — Telehealth: Payer: Self-pay

## 2020-07-25 NOTE — Telephone Encounter (Signed)
LVM

## 2020-08-01 ENCOUNTER — Other Ambulatory Visit: Payer: Self-pay

## 2020-08-01 DIAGNOSIS — R1011 Right upper quadrant pain: Secondary | ICD-10-CM

## 2020-08-20 ENCOUNTER — Other Ambulatory Visit: Payer: Self-pay

## 2020-08-20 ENCOUNTER — Ambulatory Visit (HOSPITAL_COMMUNITY)
Admission: RE | Admit: 2020-08-20 | Discharge: 2020-08-20 | Disposition: A | Discharge status: Home / Self Care | Payer: BC Managed Care – PPO | Source: Ambulatory Visit | Attending: Gastroenterology | Admitting: Gastroenterology

## 2020-08-20 DIAGNOSIS — R1011 Right upper quadrant pain: Secondary | ICD-10-CM | POA: Diagnosis not present

## 2020-08-20 MED ORDER — TECHNETIUM TC 99M MEBROFENIN IV KIT
5.4000 | PACK | Freq: Once | INTRAVENOUS | Status: AC | PRN
  Administered 2020-08-20: 5.4 via INTRAVENOUS

## 2020-09-23 ENCOUNTER — Other Ambulatory Visit: Payer: BC Managed Care – PPO | Admitting: *Deleted

## 2020-09-23 ENCOUNTER — Other Ambulatory Visit: Payer: Self-pay

## 2020-09-23 DIAGNOSIS — E7849 Other hyperlipidemia: Secondary | ICD-10-CM | POA: Diagnosis not present

## 2020-09-23 LAB — HEPATIC FUNCTION PANEL
ALT: 17 IU/L (ref 0–44)
AST: 22 IU/L (ref 0–40)
Albumin: 4.6 g/dL (ref 3.8–4.9)
Alkaline Phosphatase: 68 IU/L (ref 44–121)
Bilirubin Total: 0.7 mg/dL (ref 0.0–1.2)
Bilirubin, Direct: 0.28 mg/dL (ref 0.00–0.40)
Total Protein: 6.9 g/dL (ref 6.0–8.5)

## 2020-09-23 LAB — LIPID PANEL
Chol/HDL Ratio: 1.6 ratio (ref 0.0–5.0)
Cholesterol, Total: 103 mg/dL (ref 100–199)
HDL: 63 mg/dL (ref >39)
LDL Chol Calc (NIH): 25 mg/dL (ref 0–99)
Triglycerides: 72 mg/dL (ref 0–149)
VLDL Cholesterol Cal: 15 mg/dL (ref 5–40)

## 2020-10-14 ENCOUNTER — Ambulatory Visit (INDEPENDENT_AMBULATORY_CARE_PROVIDER_SITE_OTHER): Payer: BC Managed Care – PPO | Admitting: Gastroenterology

## 2020-10-14 ENCOUNTER — Encounter: Payer: Self-pay | Admitting: Gastroenterology

## 2020-10-14 VITALS — BP 108/66 | HR 60 | Ht 70.0 in | Wt 178.0 lb

## 2020-10-14 DIAGNOSIS — R1011 Right upper quadrant pain: Secondary | ICD-10-CM | POA: Diagnosis not present

## 2020-10-14 NOTE — Progress Notes (Signed)
Review of pertinent gastrointestinal problems: 1.  History of adenomatous colon polyps.  Colonoscopy 2007 1 hyperplastic polyp.  Colonoscopy 2017 four subcentimeter polyps, 2 of them were tubular adenomas and one was a sessile serrated polyp.  Colonoscopy 12/2018 2 mm polyp was removed.  It was a sessile serrated polyp and he was recommended to have repeat colonoscopy at 5-year interval. 2.  Right upper quadrant abdominal pain, evaluation 2022 included complete abdominal ultrasound December 2021 which was essentially normal, no stones in his gallbladder.  Blood work January 2022 normal CBC normal complete metabolic profile.  EGD February 2022 minimal acid related esophagitis, mild gastritis.  Biopsies were negative for H. pylori.  Gallbladder HIDA scan March 2022 showed no sign of biliary dyskinesia however he did have "abdominal pain following Ensure ingestion".  Similar testing 2016 by his primary care physician showed a normal gallbladder by ultrasound and completely normal gallbladder by HIDA scan   HPI: This is a very pleasant 60 year old man  His weight is down 8 pounds since his last office visit here 3 months ago, same scale in our office.  He says overall his right upper quadrant abdominal pains have improved, lessened in frequency and less in intensity over the past month or 2.  He had a dramatic diarrheal, vomiting illness that lasted about 36 hours 1 month ago and really since then his pains have improved.   ROS: complete GI ROS as described in HPI, all other review negative.  Constitutional:  No unintentional weight loss   Past Medical History:  Diagnosis Date  . Allergy   . Arthritis   . Asthma   . CAD in native artery    a. nonobstructive by cardiac CTA.  . Cataract   . Coronary artery disease   . GERD (gastroesophageal reflux disease)   . Hyperlipidemia   . Neuromuscular disorder ([place])    carpal tunnel bilateral wrists  . Pulmonary nodules   . Sinus bradycardia      Past Surgical History:  Procedure Laterality Date  . COLONOSCOPY    . INGUINAL HERNIA REPAIR  2009  . LUMBAR LAMINECTOMY  1997  . NECK SURGERY  01/2010   c5-7  . VASECTOMY      Current Outpatient Medications  Medication Sig Dispense Refill  . albuterol (VENTOLIN HFA) 108 (90 Base) MCG/ACT inhaler INHALE 2 PUFFS EVERY 6 HOURS AS NEEDED FOR COUGH, WHEEZE, SHORTNESS OF BREATH. 8.5 g 0  . Alirocumab (PRALUENT) 75 MG/ML SOAJ Inject 1 pen into the skin every 14 (fourteen) days. 2 mL 11  . Cholecalciferol (VITAMIN D3) 25 MCG (1000 UT) CAPS daily.     Marland Kitchen ezetimibe (ZETIA) 10 MG tablet TAKE 1 TABLET ONCE DAILY. 30 tablet 9  . fluticasone (FLONASE) 50 MCG/ACT nasal spray Place 2 sprays into both nostrils as needed for allergies or rhinitis.    . fluticasone (FLOVENT HFA) 44 MCG/ACT inhaler Inhale 2 puffs into the lungs in the morning and at bedtime. 1 Inhaler 12  . Ibuprofen (ADVIL PO) Take by mouth as needed.    . Multiple Vitamins-Minerals (MULTIVITAMIN ADULT PO) daily.     Marland Kitchen omeprazole (PRILOSEC) 40 MG capsule Take one capsule shortly before breakfast each day. 30 capsule 3  . sildenafil (VIAGRA) 100 MG tablet Take by mouth.     No current facility-administered medications for this visit.    Allergies as of 10/14/2020 - Review Complete 10/14/2020  Allergen Reaction Noted  . Latex  03/01/2007  . Lipitor [atorvastatin]  11/21/2013  .  Other Rash 08/15/2013  . Zocor [simvastatin]  11/21/2013  . Crestor [rosuvastatin calcium]  01/12/2019  . Gluten meal  01/25/2019    Family History  Problem Relation Age of Onset  . Osteoporosis Mother   . Parkinson's disease Father   . Cancer Father   . Alcohol abuse Other   . Asthma Other   . Depression Other   . Valvular heart disease Other   . Colon cancer Paternal Grandmother 49  . Esophageal cancer Neg Hx   . Stomach cancer Neg Hx   . Rectal cancer Neg Hx     Social History   Socioeconomic History  . Marital status: Married     Spouse name: [name]  . Number of children: 4  . Years of education: BA  . Highest education level: Not on file  Occupational History  . 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 Smoking status: Former Smoker    Packs/day: 0.50    Years: 20.00    Pack years: 10.00    Types: Cigarettes    Quit date: 06/22/1992    Years since quitting: 28.3  . Smokeless tobacco: Never Used  Vaping Use  . Vaping Use: Never used  Substance and Sexual Activity  . Alcohol use: Yes    Alcohol/week: 10.0 standard drinks    Types: 10 Standard drinks or equivalent per week  . Drug use: No  . Sexual activity: Not on file  Other Topics Concern  . Not on file  Social History Narrative   Patient is married [name]) and has four children.   Patient lives at home with his wife and children.   Patient is working  At [employer].   Patient has a BA degree.   Patient used both hands.   Patient drinks three cups of coffee daily.   Social Determinants of Health   Financial Resource Strain: Not on file  Food Insecurity: Not on file  Transportation Needs: Not on file  Physical Activity: Not on file  Stress: Not on file  Social Connections: Not on file  Intimate Partner Violence: Not on file     Physical Exam: BP 108/66   Pulse 60   Ht 5\' 10"  (1.778 m)   Wt 178 lb (80.7 kg)   BMI 25.54 kg/m  Constitutional: generally well-appearing Psychiatric: alert and oriented x3 Abdomen: soft, nontender, nondistended, no obvious ascites, no peritoneal signs, normal bowel sounds No peripheral edema noted in lower extremities  Assessment and plan: 60 y.o. male with right upper quadrant pain of unclear etiology  He has had his gallbladder tested by ultrasound and by HIDA scan this year and also about 5 years ago for the same symptoms.  EGD was essentially unrevealing as well 2022.  I am certainly very glad that his pain seems to have lessened in frequency and intensity.  Hopefully will remain  that way.  He knows to call here if the pains return, dramatically worsened.  I think at that point I would probably recommend a CT scan abdomen.  Please see the "Patient Instructions" section for addition details about the plan.  Owens Loffler, MD [facility] 10/14/2020, 1:53 PM   Total time on date of encounter was 25 minutes (this included time spent preparing to see the patient reviewing records; obtaining and/or reviewing separately obtained history; performing a medically appropriate exam and/or evaluation; counseling and educating the patient and family if present; ordering medications, tests or procedures if applicable; and documenting clinical  information in the health record).

## 2020-10-14 NOTE — Patient Instructions (Signed)
Call our office back if you having significant abdominal pain.  Follow up as needed.

## 2020-11-04 ENCOUNTER — Telehealth (INDEPENDENT_AMBULATORY_CARE_PROVIDER_SITE_OTHER): Payer: BC Managed Care – PPO | Admitting: Family Medicine

## 2020-11-04 ENCOUNTER — Other Ambulatory Visit: Payer: Self-pay

## 2020-11-04 DIAGNOSIS — J019 Acute sinusitis, unspecified: Secondary | ICD-10-CM

## 2020-11-04 MED ORDER — AMOXICILLIN-POT CLAVULANATE 875-125 MG PO TABS: 1.0000 | ORAL_TABLET | Freq: Two times a day (BID) | ORAL | 0 refills | Status: DC

## 2020-11-04 NOTE — Progress Notes (Signed)
Patient ID: Jake [name], male   DOB: 10/08/1960, 60 y.o.   MRN: [id number]  This visit type was conducted due to national recommendations for restrictions regarding the COVID-19 pandemic in an effort to limit this patient's exposure and mitigate transmission in our community.   Virtual Visit via Video Note  I connected with Jake [name] on 11/04/20 at  3:00 PM EDT by a video enabled telemedicine application and verified that I am speaking with the correct person using two identifiers.  Location patient: home Location provider:work or home office Persons participating in the virtual visit: patient, provider  I discussed the limitations of evaluation and management by telemedicine and the availability of in person appointments. The patient expressed understanding and agreed to proceed.   HPI:  Jake [name] with onset last week of sinus congestion.  He had some progression of facial pain with greenish nasal discharge.  Occasional headaches.  Also developing some left ear pain and thinks he may have some effusion.  He is on 2 rapid home COVID test which have both been negative.  History of frequent sinusitis in the past.  Does have some nighttime cough.  No sick contacts.  No dyspnea.  Taking over-the-counter Mucinex and Sudafed  ROS: See pertinent positives and negatives per HPI.  Past Medical History:  Diagnosis Date  . Allergy   . Arthritis   . Asthma   . CAD in native artery    a. nonobstructive by cardiac CTA.  . Cataract   . Coronary artery disease   . GERD (gastroesophageal reflux disease)   . Hyperlipidemia   . Neuromuscular disorder ([place])    carpal tunnel bilateral wrists  . Pulmonary nodules   . Sinus bradycardia     Past Surgical History:  Procedure Laterality Date  . COLONOSCOPY    . INGUINAL HERNIA REPAIR  2009  . LUMBAR LAMINECTOMY  1997  . NECK SURGERY  01/2010   c5-7  . VASECTOMY      Family History  Problem Relation Age of Onset  . Osteoporosis Mother    . Parkinson's disease Father   . Cancer Father   . Alcohol abuse Other   . Asthma Other   . Depression Other   . Valvular heart disease Other   . Colon cancer Paternal Grandmother 59  . Esophageal cancer Neg Hx   . Stomach cancer Neg Hx   . Rectal cancer Neg Hx     SOCIAL HX: Non-smoker   Current Outpatient Medications:  .  amoxicillin-clavulanate (AUGMENTIN) 875-125 MG tablet, Take 1 tablet by mouth 2 (two) times daily., Disp: 20 tablet, Rfl: 0 .  albuterol (VENTOLIN HFA) 108 (90 Base) MCG/ACT inhaler, INHALE 2 PUFFS EVERY 6 HOURS AS NEEDED FOR COUGH, WHEEZE, SHORTNESS OF BREATH., Disp: 8.5 g, Rfl: 0 .  Alirocumab (PRALUENT) 75 MG/ML SOAJ, Inject 1 pen into the skin every 14 (fourteen) days., Disp: 2 mL, Rfl: 11 .  Cholecalciferol (VITAMIN D3) 25 MCG (1000 UT) CAPS, daily. , Disp: , Rfl:  .  ezetimibe (ZETIA) 10 MG tablet, TAKE 1 TABLET ONCE DAILY., Disp: 30 tablet, Rfl: 9 .  fluticasone (FLONASE) 50 MCG/ACT nasal spray, Place 2 sprays into both nostrils as needed for allergies or rhinitis., Disp: , Rfl:  .  fluticasone (FLOVENT HFA) 44 MCG/ACT inhaler, Inhale 2 puffs into the lungs in the morning and at bedtime., Disp: 1 Inhaler, Rfl: 12 .  Ibuprofen (ADVIL PO), Take by mouth as needed., Disp: , Rfl:  .  Multiple  Vitamins-Minerals (MULTIVITAMIN ADULT PO), daily. , Disp: , Rfl:  .  omeprazole (PRILOSEC) 40 MG capsule, Take one capsule shortly before breakfast each day., Disp: 30 capsule, Rfl: 3 .  sildenafil (VIAGRA) 100 MG tablet, Take by mouth., Disp: , Rfl:   EXAM:  VITALS per patient if applicable:  GENERAL: alert, oriented, appears well and in no acute distress  HEENT: atraumatic, conjunttiva clear, no obvious abnormalities on inspection of external nose and ears  NECK: normal movements of the head and neck  LUNGS: on inspection no signs of respiratory distress, breathing rate appears normal, no obvious gross SOB, gasping or wheezing  CV: no obvious cyanosis  MS:  moves all visible extremities without noticeable abnormality  PSYCH/NEURO: pleasant and cooperative, no obvious depression or anxiety, speech and thought processing grossly intact  ASSESSMENT AND PLAN:  Discussed the following assessment and plan:  Acute sinusitis symptoms.  Differential is viral versus bacterial. -We discussed watchful waiting but he would like to go and start antibiotic.  He does have family funeral to go to later in the week.  We discussed predictors of bacterial sinusitis.  -Start Augmentin 875 mg twice daily with food -Stay well-hydrated -Follow-up for any persistent or worsening symptoms     I discussed the assessment and treatment plan with the patient. The patient was provided an opportunity to ask questions and all were answered. The patient agreed with the plan and demonstrated an understanding of the instructions.   The patient was advised to call back or seek an in-person evaluation if the symptoms worsen or if the condition fails to improve as anticipated.     Carolann Littler, MD

## 2020-11-06 DIAGNOSIS — U071 COVID-19: Secondary | ICD-10-CM | POA: Diagnosis not present

## 2020-11-07 ENCOUNTER — Telehealth: Payer: Self-pay | Admitting: Cardiology

## 2020-11-07 NOTE — Telephone Encounter (Signed)
Called pt and informed him that Dr. Johney Frame would not be able to prescribe an antibiotic for COVID.  I advised him to contact his PCP or urgent care.  He thanked me for returning his call.

## 2020-11-07 NOTE — Telephone Encounter (Signed)
Pt tested positive for covid-19 on 11/06/20, Jake [name] is unable to get in touch with his PCP Dr. Elease Hashimoto at [place], pt would like to know if Dr. Johney Frame can prescribe him antibiotics. Please advise

## 2020-12-10 DIAGNOSIS — M7662 Achilles tendinitis, left leg: Secondary | ICD-10-CM | POA: Diagnosis not present

## 2020-12-10 DIAGNOSIS — M6702 Short Achilles tendon (acquired), left ankle: Secondary | ICD-10-CM | POA: Diagnosis not present

## 2020-12-10 DIAGNOSIS — M25572 Pain in left ankle and joints of left foot: Secondary | ICD-10-CM | POA: Diagnosis not present

## 2020-12-10 DIAGNOSIS — M79672 Pain in left foot: Secondary | ICD-10-CM | POA: Diagnosis not present

## 2020-12-17 ENCOUNTER — Other Ambulatory Visit: Payer: Self-pay | Admitting: Gastroenterology

## 2020-12-18 DIAGNOSIS — M79672 Pain in left foot: Secondary | ICD-10-CM | POA: Diagnosis not present

## 2021-01-10 DIAGNOSIS — M7662 Achilles tendinitis, left leg: Secondary | ICD-10-CM | POA: Diagnosis not present

## 2021-03-04 ENCOUNTER — Ambulatory Visit (INDEPENDENT_AMBULATORY_CARE_PROVIDER_SITE_OTHER): Payer: BC Managed Care – PPO | Admitting: Family Medicine

## 2021-03-04 ENCOUNTER — Other Ambulatory Visit: Payer: Self-pay

## 2021-03-04 ENCOUNTER — Ambulatory Visit (HOSPITAL_BASED_OUTPATIENT_CLINIC_OR_DEPARTMENT_OTHER)
Admission: RE | Admit: 2021-03-04 | Discharge: 2021-03-04 | Disposition: A | Discharge status: Home / Self Care | Payer: BC Managed Care – PPO | Source: Ambulatory Visit | Attending: Family Medicine | Admitting: Family Medicine

## 2021-03-04 ENCOUNTER — Encounter: Payer: Self-pay | Admitting: Family Medicine

## 2021-03-04 VITALS — BP 126/70 | HR 80 | Temp 98.1°F | Wt 181.0 lb

## 2021-03-04 DIAGNOSIS — R0600 Dyspnea, unspecified: Secondary | ICD-10-CM | POA: Insufficient documentation

## 2021-03-04 DIAGNOSIS — Z23 Encounter for immunization: Secondary | ICD-10-CM | POA: Diagnosis not present

## 2021-03-04 NOTE — Progress Notes (Signed)
Established Patient Office Visit  Subjective:  Patient ID: Jake [name], male    DOB: June 27, 1960  Age: 60 y.o. MRN: [id number]  CC:  Chief Complaint  Patient presents with   Shortness of Breath    Lingering symptom of covid, SOB off and on, just feels like he can quite get a deep enough breath    HPI Jake [name] presents for some mild shortness of breath intermittently since he had COVID back in May around [facility].  He really does not have any significant dyspnea with exertion.  He states he has sometimes difficulty getting a full deep breath.  No pleuritic pain.  No cough.  Denies any exertional dyspnea or any exertional chest pain.  He is followed by rheumatologist and did recently go on methotrexate was having some dyspnea prior to that.  Symptoms are really fairly mild.  Patient did quit smoking back in 1994 after about 10-pack-year history.  No hemoptysis.  No appetite or weight changes.  Past Medical History:  Diagnosis Date   Allergy    Arthritis    Asthma    CAD in native artery    a. nonobstructive by cardiac CTA.   Cataract    Coronary artery disease    GERD (gastroesophageal reflux disease)    Hyperlipidemia    Neuromuscular disorder ([facility])    carpal tunnel bilateral wrists   Pulmonary nodules    Sinus bradycardia     Past Surgical History:  Procedure Laterality Date   COLONOSCOPY     INGUINAL HERNIA REPAIR  2009   LUMBAR LAMINECTOMY  1997   NECK SURGERY  01/2010   c5-7   VASECTOMY      Family History  Problem Relation Age of Onset   Osteoporosis Mother    Parkinson's disease Father    Cancer Father    Alcohol abuse Other    Asthma Other    Depression Other    Valvular heart disease Other    Colon cancer Paternal Grandmother 99   Esophageal cancer Neg Hx    Stomach cancer Neg Hx    Rectal cancer Neg Hx     Social History   Socioeconomic History   Marital status: Married    Spouse name: [name]   Number of children: 4   Years of  education: BA   Highest education level: Not on file  Occupational History   Occupation: [employer]  Tobacco Use   Smoking status: Former    Packs/day: 0.50    Years: 20.00    Pack years: 10.00    Types: Cigarettes    Quit date: 06/22/1992    Years since quitting: 28.7   Smokeless tobacco: Never  Vaping Use   Vaping Use: Never used  Substance and Sexual Activity   Alcohol use: Yes    Alcohol/week: 10.0 standard drinks    Types: 10 Standard drinks or equivalent per week   Drug use: No   Sexual activity: Not on file  Other Topics Concern   Not on file  Social History Narrative   Patient is married [name]) and has four children.   Patient lives at home with his wife and children.   Patient is working  At [employer].   Patient has a BA degree.   Patient used both hands.   Patient drinks three cups of coffee daily.   Social Determinants of Health   Financial Resource Strain: Not on file  Food Insecurity: Not  on file  Transportation Needs: Not on file  Physical Activity: Not on file  Stress: Not on file  Social Connections: Not on file  Intimate Partner Violence: Not on file    Outpatient Medications Prior to Visit  Medication Sig Dispense Refill   albuterol (VENTOLIN HFA) 108 (90 Base) MCG/ACT inhaler INHALE 2 PUFFS EVERY 6 HOURS AS NEEDED FOR COUGH, WHEEZE, SHORTNESS OF BREATH. 8.5 g 0   Alirocumab (PRALUENT) 75 MG/ML SOAJ Inject 1 pen into the skin every 14 (fourteen) days. 2 mL 11   amoxicillin-clavulanate (AUGMENTIN) 875-125 MG tablet Take 1 tablet by mouth 2 (two) times daily. 20 tablet 0   Cholecalciferol (VITAMIN D3) 25 MCG (1000 UT) CAPS daily.      ezetimibe (ZETIA) 10 MG tablet TAKE 1 TABLET ONCE DAILY. 30 tablet 9   fluticasone (FLONASE) 50 MCG/ACT nasal spray Place 2 sprays into both nostrils as needed for allergies or rhinitis.     fluticasone (FLOVENT HFA) 44 MCG/ACT inhaler Inhale 2 puffs into the lungs in the morning and at bedtime. 1  Inhaler 12   Ibuprofen (ADVIL PO) Take by mouth as needed.     Multiple Vitamins-Minerals (MULTIVITAMIN ADULT PO) daily.      omeprazole (PRILOSEC) 40 MG capsule Take one capsule shortly before breakfast each day. 30 capsule 9   sildenafil (VIAGRA) 100 MG tablet Take by mouth.     No facility-administered medications prior to visit.    Allergies  Allergen Reactions   Latex     REACTION: RASH   Lipitor [Atorvastatin]     Muscle aches at 20 mg     Other Rash    Wool pants caused rash    Zocor [Simvastatin]     Muscle aches at 20 mg daily   Crestor [Rosuvastatin Calcium]     Muscle aches   Gluten Meal     Digestive symptoms    ROS Review of Systems  Constitutional:  Negative for chills and fever.  Respiratory:  Positive for shortness of breath. Negative for cough and wheezing.   Cardiovascular:  Negative for chest pain, palpitations and leg swelling.  Neurological:  Negative for dizziness.  Hematological:  Negative for adenopathy.     Objective:    Physical Exam Vitals reviewed.  Constitutional:      Appearance: He is well-developed.  Cardiovascular:     Rate and Rhythm: Normal rate and regular rhythm.     Heart sounds: No murmur heard. Pulmonary:     Effort: Pulmonary effort is normal.     Breath sounds: Normal breath sounds. No decreased breath sounds, wheezing or rales.  Musculoskeletal:     Right lower leg: No edema.     Left lower leg: No edema.  Neurological:     Mental Status: He is alert.    BP 126/70 (BP Location: Left Arm, Patient Position: Sitting, Cuff Size: Normal)   Pulse 80   Temp 98.1 F (36.7 C) (Oral)   Wt 181 lb (82.1 kg)   SpO2 96%   BMI 25.97 kg/m  Wt Readings from Last 3 Encounters:  03/04/21 181 lb (82.1 kg)  10/14/20 178 lb (80.7 kg)  08/20/20 180 lb (81.6 kg)     Health Maintenance Due  Topic Date Due   HIV Screening  Never done   Zoster Vaccines- Shingrix (1 of 2) Never done   COVID-19 Vaccine (3 - Booster for Pfizer  series) 02/04/2020    There are no preventive care reminders to display for  this patient.  Lab Results  Component Value Date   TSH 2.20 12/11/2019   Lab Results  Component Value Date   WBC 5.7 06/13/2020   HGB 15.2 06/13/2020   HCT 43.3 06/13/2020   MCV 88 06/13/2020   PLT 265 06/13/2020   Lab Results  Component Value Date   NA 143 06/13/2020   K 4.2 06/13/2020   CO2 26 06/13/2020   GLUCOSE 100 (H) 06/13/2020   BUN 10 06/13/2020   CREATININE 0.97 06/13/2020   BILITOT 0.7 09/23/2020   ALKPHOS 68 09/23/2020   AST 22 09/23/2020   ALT 17 09/23/2020   PROT 6.9 09/23/2020   ALBUMIN 4.6 09/23/2020   CALCIUM 8.8 06/13/2020   ANIONGAP 12 01/18/2019   GFR 88.80 07/04/2014   Lab Results  Component Value Date   CHOL 103 09/23/2020   Lab Results  Component Value Date   HDL 63 09/23/2020   Lab Results  Component Value Date   LDLCALC 25 09/23/2020   Lab Results  Component Value Date   TRIG 72 09/23/2020   Lab Results  Component Value Date   CHOLHDL 1.6 09/23/2020   No results found for: HGBA1C    Assessment & Plan:   Problem List Items Addressed This Visit   None Visit Diagnoses     Dyspnea, unspecified type    -  Primary   Relevant Orders   DG Chest 2 View   Need for immunization against influenza       Relevant Orders   Flu Vaccine QUAD 60mo+IM (Fluarix, Fluzone & Alfiuria Quad PF) (Completed)     Patient relates mild intermittent dyspnea at rest since having COVID back around [facility] Day.  Does not have any red flags such as weight loss, fever, persistent cough, pleuritic pain, chest pains, or exertional chest pain or dyspnea. -Obtain PA and lateral chest x-ray -Influenza vaccine given -Encouraged to set up complete physical -If chest x-ray normal and continues to have complaints of dyspnea or certainly any progression of symptoms consider pulmonary function testing.  No orders of the defined types were placed in this encounter.   Follow-up: No  follow-ups on file.    Carolann Littler, MD

## 2021-03-04 NOTE — Patient Instructions (Signed)
Set up complete physical.    Go for CXR at [facility] 8 AM to 5 PM.

## 2021-03-05 DIAGNOSIS — M898X7 Other specified disorders of bone, ankle and foot: Secondary | ICD-10-CM | POA: Diagnosis not present

## 2021-03-05 DIAGNOSIS — M6702 Short Achilles tendon (acquired), left ankle: Secondary | ICD-10-CM | POA: Diagnosis not present

## 2021-03-05 DIAGNOSIS — M7662 Achilles tendinitis, left leg: Secondary | ICD-10-CM | POA: Diagnosis not present

## 2021-03-18 ENCOUNTER — Ambulatory Visit (INDEPENDENT_AMBULATORY_CARE_PROVIDER_SITE_OTHER): Payer: BC Managed Care – PPO | Admitting: Family Medicine

## 2021-03-18 ENCOUNTER — Other Ambulatory Visit: Payer: Self-pay

## 2021-03-18 ENCOUNTER — Encounter: Payer: Self-pay | Admitting: Family Medicine

## 2021-03-18 VITALS — BP 128/84 | HR 75 | Temp 97.8°F | Ht 69.25 in | Wt 183.4 lb

## 2021-03-18 DIAGNOSIS — E785 Hyperlipidemia, unspecified: Secondary | ICD-10-CM

## 2021-03-18 DIAGNOSIS — Z125 Encounter for screening for malignant neoplasm of prostate: Secondary | ICD-10-CM | POA: Diagnosis not present

## 2021-03-18 DIAGNOSIS — Z0000 Encounter for general adult medical examination without abnormal findings: Secondary | ICD-10-CM | POA: Diagnosis not present

## 2021-03-18 LAB — HEPATIC FUNCTION PANEL
ALT: 17 U/L (ref 0–53)
AST: 19 U/L (ref 0–37)
Albumin: 4.5 g/dL (ref 3.5–5.2)
Alkaline Phosphatase: 57 U/L (ref 39–117)
Bilirubin, Direct: 0.2 mg/dL (ref 0.0–0.3)
Total Bilirubin: 0.8 mg/dL (ref 0.2–1.2)
Total Protein: 6.7 g/dL (ref 6.0–8.3)

## 2021-03-18 LAB — LIPID PANEL
Cholesterol: 101 mg/dL (ref 0–200)
HDL: 61 mg/dL (ref >39.00)
LDL Cholesterol: 28 mg/dL (ref 0–99)
NonHDL: 39.78
Total CHOL/HDL Ratio: 2
Triglycerides: 58 mg/dL (ref 0.0–149.0)
VLDL: 11.6 mg/dL (ref 0.0–40.0)

## 2021-03-18 LAB — CBC WITH DIFFERENTIAL/PLATELET
Basophils Absolute: 0 10*3/uL (ref 0.0–0.1)
Basophils Relative: 0.4 % (ref 0.0–3.0)
Eosinophils Absolute: 0.1 10*3/uL (ref 0.0–0.7)
Eosinophils Relative: 2.7 % (ref 0.0–5.0)
HCT: 40.5 % (ref 39.0–52.0)
Hemoglobin: 13.9 g/dL (ref 13.0–17.0)
Lymphocytes Relative: 39.1 % (ref 12.0–46.0)
Lymphs Abs: 2.1 10*3/uL (ref 0.7–4.0)
MCHC: 34.2 g/dL (ref 30.0–36.0)
MCV: 92.6 fl (ref 78.0–100.0)
Monocytes Absolute: 0.4 10*3/uL (ref 0.1–1.0)
Monocytes Relative: 8.2 % (ref 3.0–12.0)
Neutro Abs: 2.7 10*3/uL (ref 1.4–7.7)
Neutrophils Relative %: 49.6 % (ref 43.0–77.0)
Platelets: 265 10*3/uL (ref 150.0–400.0)
RBC: 4.37 Mil/uL (ref 4.22–5.81)
RDW: 12.9 % (ref 11.5–15.5)
WBC: 5.4 10*3/uL (ref 4.0–10.5)

## 2021-03-18 LAB — BASIC METABOLIC PANEL
BUN: 10 mg/dL (ref 6–23)
CO2: 27 mEq/L (ref 19–32)
Calcium: 9.2 mg/dL (ref 8.4–10.5)
Chloride: 104 mEq/L (ref 96–112)
Creatinine, Ser: 0.95 mg/dL (ref 0.40–1.50)
GFR: 86.72 mL/min (ref >60.00)
Glucose, Bld: 92 mg/dL (ref 70–99)
Potassium: 4 mEq/L (ref 3.5–5.1)
Sodium: 140 mEq/L (ref 135–145)

## 2021-03-18 LAB — PSA: PSA: 0.75 ng/mL (ref 0.10–4.00)

## 2021-03-18 LAB — TSH: TSH: 2.21 u[IU]/mL (ref 0.35–5.50)

## 2021-03-18 NOTE — Patient Instructions (Signed)
Consider Shingrix vaccine- and let us know if interested   

## 2021-03-18 NOTE — Progress Notes (Signed)
Established Patient Office Visit  Subjective:  Patient ID: Jake [name], male    DOB: [birth year]  Age: 60 y.o. MRN: [id number]  CC:  Chief Complaint  Patient presents with   Annual Exam    HPI Jake [name] presents for physical exam.  He has history of nonobstructive CAD, obstructive sleep apnea, GERD with history of esophageal stricture and Schatzki's ring, and hyperlipidemia.  He remains on Praluent.  Recently placed on methotrexate for fairly severe dyshidrotic eczema.  He is not sure this has helped much.    Health maintenance reviewed:  -Flu vaccine already given -Prior hepatitis C screen negative -Colonoscopy due 2025 -Tetanus due 2029 -He had previous Zostavax.  Undecided regarding Shingrix.  Family history reviewed-mother had some type of "blood cancer.  He does not think this was leukemia.  Father died of glioblastoma age 29.  His father also had history of heart disease and Parkinson's.  He had a brother that had alcohol abuse history and died of complications from that.  Other brother had schizophrenia  Social history-married with 4 sons.  Multiple hobbies.  Never smoked.  No regular alcohol use.  Past Medical History:  Diagnosis Date   Allergy    Arthritis    Asthma    CAD in native artery    a. nonobstructive by cardiac CTA.   Cataract    Coronary artery disease    GERD (gastroesophageal reflux disease)    Hyperlipidemia    Neuromuscular disorder (HCC)    carpal tunnel bilateral wrists   Pulmonary nodules    Sinus bradycardia     Past Surgical History:  Procedure Laterality Date   COLONOSCOPY     INGUINAL HERNIA REPAIR  2009   [place]   NECK SURGERY  01/2010   c5-7   VASECTOMY      Family History  Problem Relation Age of Onset   Cancer Mother        "blood cancer"   Osteoporosis Mother    Heart disease Father    Parkinson's disease Father    Cancer Father 70       glioblastoma   Schizophrenia Brother    Heart disease  Brother    Alcohol abuse Brother    Colon cancer Paternal Grandmother 99   Alcohol abuse Other    Asthma Other    Depression Other    Valvular heart disease Other    Esophageal cancer Neg Hx    Stomach cancer Neg Hx    Rectal cancer Neg Hx     Social History   Socioeconomic History   Marital status: Married    Spouse name: [name]   Number of children: 4   Years of education: BA   Highest education level: Not on file  Occupational History   Occupation: [employer]  Tobacco Use   Smoking status: Former    Packs/day: 0.50    Years: 20.00    Pack years: 10.00    Types: Cigarettes    Quit date: 06/22/1992    Years since quitting: 28.7   Smokeless tobacco: Never  Vaping Use   Vaping Use: Never used  Substance and Sexual Activity   Alcohol use: Yes    Alcohol/week: 10.0 standard drinks    Types: 10 Standard drinks or equivalent per week   Drug use: No   Sexual activity: Not on file  Other Topics Concern   Not on file  Social History Narrative   Patient  is married [name]) and has four children.   Patient lives at home with his wife and children.   Patient is working  At [employer].   Patient has a BA degree.   Patient used both hands.   Patient drinks three cups of coffee daily.   Social Determinants of Health   Financial Resource Strain: Not on file  Food Insecurity: Not on file  Transportation Needs: Not on file  Physical Activity: Not on file  Stress: Not on file  Social Connections: Not on file  Intimate Partner Violence: Not on file    Outpatient Medications Prior to Visit  Medication Sig Dispense Refill   albuterol (VENTOLIN HFA) 108 (90 Base) MCG/ACT inhaler INHALE 2 PUFFS EVERY 6 HOURS AS NEEDED FOR COUGH, WHEEZE, SHORTNESS OF BREATH. 8.5 g 0   Alirocumab (PRALUENT) 75 MG/ML SOAJ Inject 1 pen into the skin every 14 (fourteen) days. 2 mL 11   ezetimibe (ZETIA) 10 MG tablet TAKE 1 TABLET ONCE DAILY. 30 tablet 9   fluticasone  (FLOVENT HFA) 44 MCG/ACT inhaler Inhale 2 puffs into the lungs in the morning and at bedtime. 1 Inhaler 12   Ibuprofen (ADVIL PO) Take by mouth as needed.     methotrexate (RHEUMATREX) 2.5 MG tablet methotrexate sodium 2.5 mg tablet  TAKE 6 TABLETS ONCE A WEEK.     Multiple Vitamins-Minerals (MULTIVITAMIN ADULT PO) daily.      omeprazole (PRILOSEC) 40 MG capsule Take one capsule shortly before breakfast each day. 30 capsule 9   sildenafil (VIAGRA) 100 MG tablet Take by mouth.     amoxicillin-clavulanate (AUGMENTIN) 875-125 MG tablet Take 1 tablet by mouth 2 (two) times daily. (Patient not taking: Reported on 03/18/2021) 20 tablet 0   Cholecalciferol (VITAMIN D3) 25 MCG (1000 UT) CAPS daily.  (Patient not taking: Reported on 03/18/2021)     fluticasone (FLONASE) 50 MCG/ACT nasal spray Place 2 sprays into both nostrils as needed for allergies or rhinitis. (Patient not taking: Reported on 03/18/2021)     No facility-administered medications prior to visit.    Allergies  Allergen Reactions   Latex     REACTION: RASH   Lipitor [Atorvastatin]     Muscle aches at 20 mg     Other Rash    Wool pants caused rash    Zocor [Simvastatin]     Muscle aches at 20 mg daily   Crestor [Rosuvastatin Calcium]     Muscle aches   Gluten Meal     Digestive symptoms    ROS Review of Systems  Constitutional:  Negative for activity change, appetite change, fatigue and fever.  HENT:  Negative for congestion, ear pain and trouble swallowing.   Eyes:  Negative for pain and visual disturbance.  Respiratory:  Negative for cough, shortness of breath and wheezing.   Cardiovascular:  Negative for chest pain and palpitations.  Gastrointestinal:  Negative for abdominal distention, abdominal pain, blood in stool, constipation, diarrhea, nausea, rectal pain and vomiting.  Genitourinary:  Negative for dysuria, hematuria and testicular pain.  Musculoskeletal:  Negative for arthralgias and joint swelling.  Skin:   Positive for rash.  Neurological:  Negative for dizziness, syncope and headaches.  Hematological:  Negative for adenopathy.  Psychiatric/Behavioral:  Negative for confusion and dysphoric mood.      Objective:    Physical Exam Vitals reviewed.  Constitutional:      Appearance: Normal appearance.  HENT:     Right Ear: Ear canal normal.  Left Ear: Ear canal normal.     Mouth/Throat:     Mouth: Mucous membranes are moist.     Pharynx: Oropharynx is clear.  Cardiovascular:     Rate and Rhythm: Normal rate and regular rhythm.  Pulmonary:     Effort: Pulmonary effort is normal.     Breath sounds: Normal breath sounds.  Abdominal:     Palpations: Abdomen is soft.     Tenderness: There is no abdominal tenderness.     Comments: Ventral hernia which is soft and nontender  Musculoskeletal:     Cervical back: Neck supple.     Right lower leg: No edema.     Left lower leg: No edema.  Lymphadenopathy:     Cervical: No cervical adenopathy.  Neurological:     General: No focal deficit present.     Mental Status: He is alert.     Cranial Nerves: No cranial nerve deficit.  Psychiatric:        Mood and Affect: Mood normal.        Thought Content: Thought content normal.    BP 128/84 (BP Location: Right Arm, Patient Position: Sitting, Cuff Size: Normal)   Pulse 75   Temp 97.8 F (36.6 C) (Oral)   Ht 5' 9.25" (1.759 m)   Wt 183 lb 6.4 oz (83.2 kg)   SpO2 97%   BMI 26.89 kg/m  Wt Readings from Last 3 Encounters:  03/18/21 183 lb 6.4 oz (83.2 kg)  03/04/21 181 lb (82.1 kg)  10/14/20 178 lb (80.7 kg)     Health Maintenance Due  Topic Date Due   HIV Screening  Never done   Zoster Vaccines- Shingrix (1 of 2) Never done   COVID-19 Vaccine (3 - Booster for Pfizer series) 02/04/2020    There are no preventive care reminders to display for this patient.  Lab Results  Component Value Date   TSH 2.20 12/11/2019   Lab Results  Component Value Date   WBC 5.7 06/13/2020   HGB  15.2 06/13/2020   HCT 43.3 06/13/2020   MCV 88 06/13/2020   PLT 265 06/13/2020   Lab Results  Component Value Date   NA 143 06/13/2020   K 4.2 06/13/2020   CO2 26 06/13/2020   GLUCOSE 100 (H) 06/13/2020   BUN 10 06/13/2020   CREATININE 0.97 06/13/2020   BILITOT 0.7 09/23/2020   ALKPHOS 68 09/23/2020   AST 22 09/23/2020   ALT 17 09/23/2020   PROT 6.9 09/23/2020   ALBUMIN 4.6 09/23/2020   CALCIUM 8.8 06/13/2020   ANIONGAP 12 01/18/2019   GFR 88.80 07/04/2014   Lab Results  Component Value Date   CHOL 103 09/23/2020   Lab Results  Component Value Date   HDL 63 09/23/2020   Lab Results  Component Value Date   LDLCALC 25 09/23/2020   Lab Results  Component Value Date   TRIG 72 09/23/2020   Lab Results  Component Value Date   CHOLHDL 1.6 09/23/2020   No results found for: HGBA1C    Assessment & Plan:   Problem List Items Addressed This Visit   None Visit Diagnoses     Physical exam    -  Primary   Relevant Orders   Basic metabolic panel   Lipid panel   CBC with Differential/Platelet   TSH   Hepatic function panel   PSA     60 year old male with history of nonobstructive CAD.  Generally doing well.  We discussed the  following health maintenance items  -Flu vaccine already given -Tetanus up-to-date -He has had Zostavax and we discussed Shingrix.  He will consider. -Obtain screening labs as above -Colonoscopy up-to-date  No orders of the defined types were placed in this encounter.   Follow-up: No follow-ups on file.    Carolann Littler, MD

## 2021-03-19 DIAGNOSIS — M79672 Pain in left foot: Secondary | ICD-10-CM | POA: Diagnosis not present

## 2021-03-19 DIAGNOSIS — L4059 Other psoriatic arthropathy: Secondary | ICD-10-CM | POA: Diagnosis not present

## 2021-03-19 DIAGNOSIS — R21 Rash and other nonspecific skin eruption: Secondary | ICD-10-CM | POA: Diagnosis not present

## 2021-03-19 DIAGNOSIS — M130 Polyarthritis, unspecified: Secondary | ICD-10-CM | POA: Diagnosis not present

## 2021-03-19 DIAGNOSIS — M898X7 Other specified disorders of bone, ankle and foot: Secondary | ICD-10-CM | POA: Diagnosis not present

## 2021-03-19 DIAGNOSIS — R269 Unspecified abnormalities of gait and mobility: Secondary | ICD-10-CM | POA: Diagnosis not present

## 2021-03-19 NOTE — Telephone Encounter (Signed)
Dr. Burchette pt  

## 2021-03-19 NOTE — Telephone Encounter (Signed)
Please advise. Rx is not on the current med list 

## 2021-03-21 ENCOUNTER — Other Ambulatory Visit: Payer: Self-pay | Admitting: Family Medicine

## 2021-03-22 IMAGING — DX PORTABLE CHEST - 1 VIEW
1 series · 1 of 1 positions shown · non-contrast
Comparison: Radiograph 09/05/2017, CT 10/19/2017

CLINICAL DATA: Chest and epigastric pain for 1 day

EXAM:
PORTABLE CHEST 1 VIEW

[chest ap]
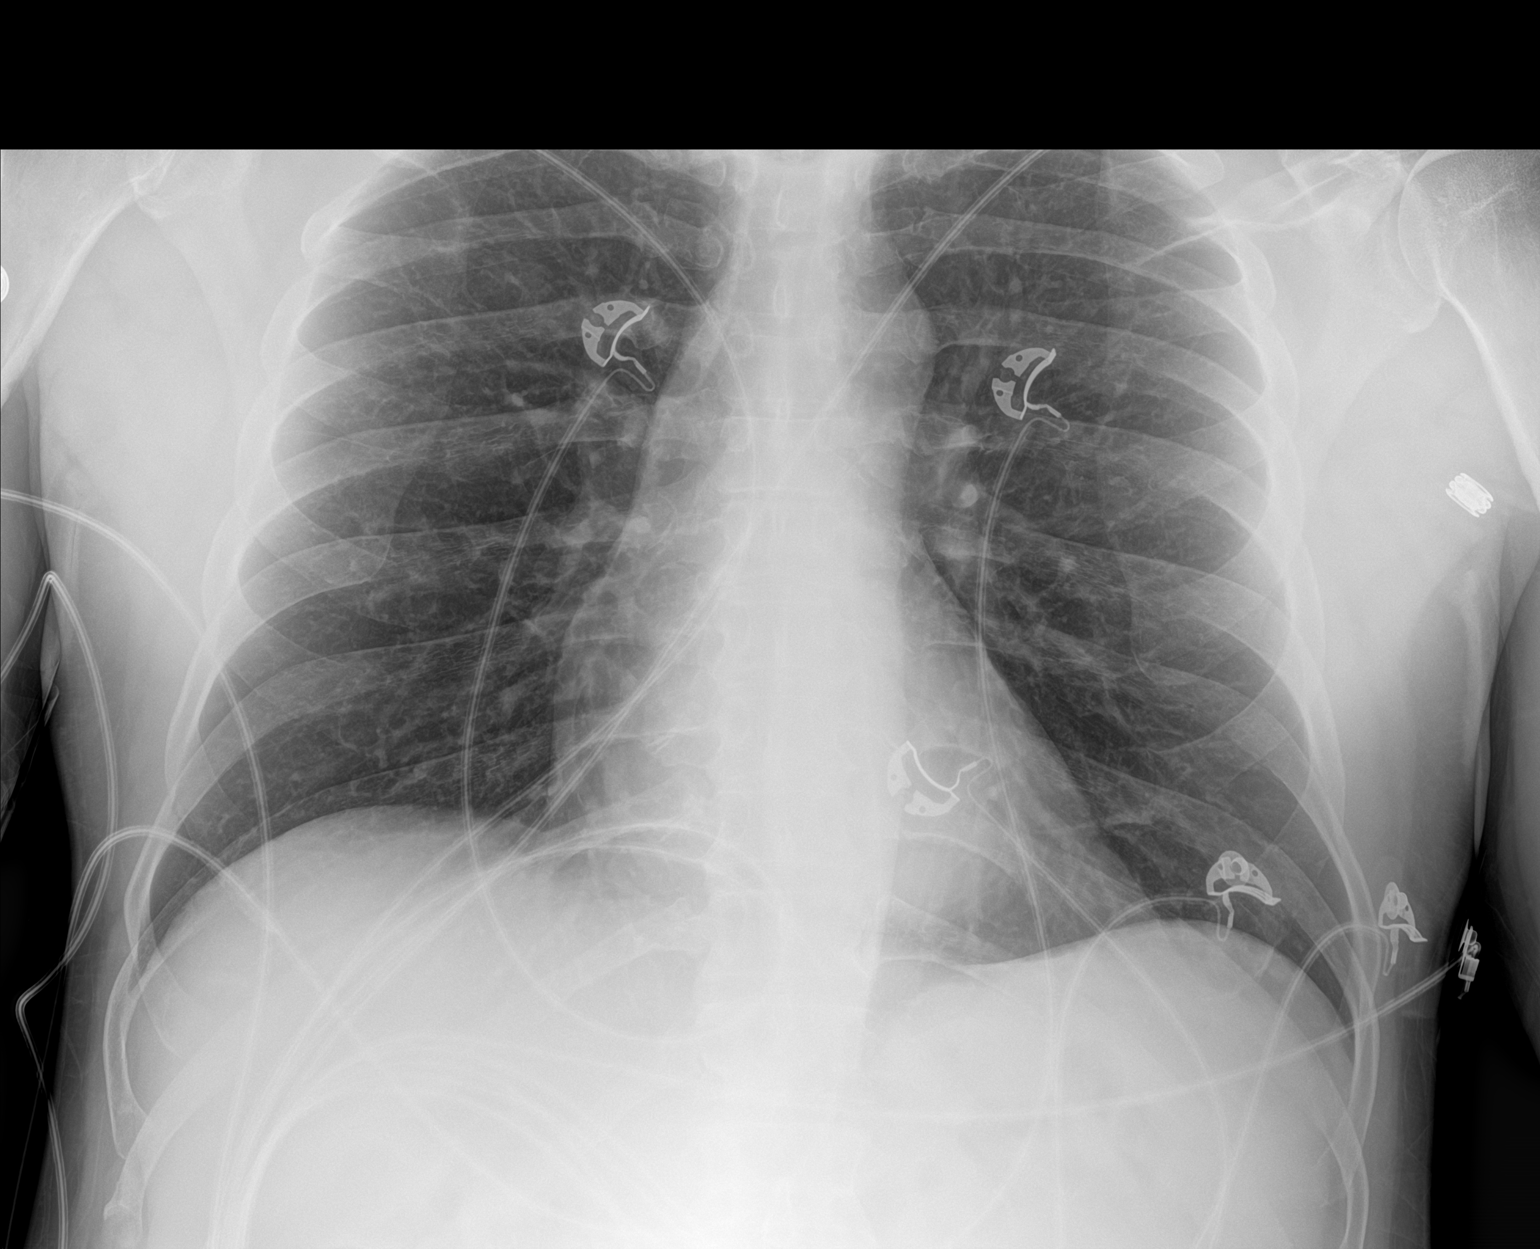

[1 of 1 positions shown; findings below may reference images not displayed]

FINDINGS: No consolidation, features of edema, pneumothorax, or effusion.
Pulmonary vascularity is normally distributed. The cardiomediastinal
contours are unremarkable. No acute osseous or soft tissue
abnormality. Cardiac monitoring leads overlie the chest.
IMPRESSION: No acute cardiopulmonary abnormality

## 2021-03-31 DIAGNOSIS — M898X7 Other specified disorders of bone, ankle and foot: Secondary | ICD-10-CM | POA: Diagnosis not present

## 2021-03-31 DIAGNOSIS — M79672 Pain in left foot: Secondary | ICD-10-CM | POA: Diagnosis not present

## 2021-03-31 DIAGNOSIS — R269 Unspecified abnormalities of gait and mobility: Secondary | ICD-10-CM | POA: Diagnosis not present

## 2021-04-03 DIAGNOSIS — D489 Neoplasm of uncertain behavior, unspecified: Secondary | ICD-10-CM | POA: Diagnosis not present

## 2021-04-03 DIAGNOSIS — Z23 Encounter for immunization: Secondary | ICD-10-CM | POA: Diagnosis not present

## 2021-04-03 DIAGNOSIS — L603 Nail dystrophy: Secondary | ICD-10-CM | POA: Diagnosis not present

## 2021-04-03 DIAGNOSIS — B351 Tinea unguium: Secondary | ICD-10-CM | POA: Diagnosis not present

## 2021-04-08 DIAGNOSIS — M898X7 Other specified disorders of bone, ankle and foot: Secondary | ICD-10-CM | POA: Diagnosis not present

## 2021-04-08 DIAGNOSIS — M79672 Pain in left foot: Secondary | ICD-10-CM | POA: Diagnosis not present

## 2021-04-08 DIAGNOSIS — R269 Unspecified abnormalities of gait and mobility: Secondary | ICD-10-CM | POA: Diagnosis not present

## 2021-04-10 NOTE — Progress Notes (Signed)
Cardiology Office Note:    Date:  04/11/2021   ID:  Kipp Brood, DOB August 14, 1960, MRN [id number]  PCP:  Eulas Post, MD   [facility] Providers Cardiologist:  Werner Lean, MD     Referring MD: Eulas Post, MD   CC: Transition to new cardiologist  History of Present Illness:    Jake [name] is a 60 y.o. male with a hx of Non obstructive CAD; HLD on Praluent, Asthma who presents for evaluation 04/11/21.  Patient notes that he is doing well.   Since last visit notes that  he has Achilles tendon issues and is starting PT. Is having issues with his skin: seen derm and rheum; on prednisone has improved this.  Has nail bed issues and  There are no interval hospital/ED visit.    Had COVID in June->  has had some shortness of breath that he describes as a yawn that couldn't finish.  Rare chest pain last week:  felt a fluttering feeling his his chest that lasted a few seconds and resolved.  Has a fair amount of activity related.  Past Medical History:  Diagnosis Date   Allergy    Arthritis    Asthma    CAD in native artery    a. nonobstructive by cardiac CTA.   Cataract    Coronary artery disease    GERD (gastroesophageal reflux disease)    Hyperlipidemia    Neuromuscular disorder (HCC)    carpal tunnel bilateral wrists   Pulmonary nodules    Sinus bradycardia     Past Surgical History:  Procedure Laterality Date   COLONOSCOPY     INGUINAL HERNIA REPAIR  2009   LUMBAR LAMINECTOMY  1997   NECK SURGERY  01/2010   c5-7   VASECTOMY      Current Medications: Current Meds  Medication Sig   albuterol (VENTOLIN HFA) 108 (90 Base) MCG/ACT inhaler INHALE 2 PUFFS EVERY 6 HOURS AS NEEDED FOR COUGH, WHEEZE, SHORTNESS OF BREATH.   Alirocumab (PRALUENT) 75 MG/ML SOAJ Inject 1 pen into the skin every 14 (fourteen) days.   ezetimibe (ZETIA) 10 MG tablet TAKE 1 TABLET ONCE DAILY.   fluticasone (FLONASE) 50 MCG/ACT nasal spray Place 2 sprays into both  nostrils as needed for allergies or rhinitis.   Ibuprofen (ADVIL PO) Take by mouth as needed.   Multiple Vitamins-Minerals (MULTIVITAMIN ADULT PO) daily.    omeprazole (PRILOSEC) 40 MG capsule Take one capsule shortly before breakfast each day.   sildenafil (VIAGRA) 100 MG tablet Take by mouth.     Allergies:   Latex, Lipitor [atorvastatin], Other, Zocor [simvastatin], Crestor [rosuvastatin calcium], and Gluten meal   Social History   Socioeconomic History   Marital status: Married    Spouse name: [name]   Number of children: 4   Years of education: BA   Highest education level: Not on file  Occupational History   Occupation: [employer]  Tobacco Use   Smoking status: Former    Packs/day: 0.50    Years: 20.00    Pack years: 10.00    Types: Cigarettes    Quit date: 06/22/1992    Years since quitting: 28.8   Smokeless tobacco: Never  Vaping Use   Vaping Use: Never used  Substance and Sexual Activity   Alcohol use: Yes    Alcohol/week: 10.0 standard drinks    Types: 10 Standard drinks or equivalent per week   Drug use: No   Sexual activity: Not  on file  Other Topics Concern   Not on file  Social History Narrative   Patient is married [name]) and has four children.   Patient lives at home with his wife and children.   Patient is working  At [employer].   Patient has a BA degree.   Patient used both hands.   Patient drinks three cups of coffee daily.   Social Determinants of Health   Financial Resource Strain: Not on file  Food Insecurity: Not on file  Transportation Needs: Not on file  Physical Activity: Not on file  Stress: Not on file  Social Connections: Not on file     Family History: The patient's family history includes Alcohol abuse in his brother and another family member; Asthma in an other family member; Cancer in his mother; Cancer (age of onset: 43) in his father; Colon cancer (age of onset: 102) in his paternal grandmother;  Depression in an other family member; Heart disease in his brother and father; Osteoporosis in his mother; Parkinson's disease in his father; Schizophrenia in his brother; Valvular heart disease in an other family member. There is no history of Esophageal cancer, Stomach cancer, or Rectal cancer.  ROS:   Please see the history of present illness.     All other systems reviewed and are negative.  EKGs/Labs/Other Studies Reviewed:    The following studies were reviewed today:  Stress Echocardiogram: Date: 04/19/2020 Results:Report only  Study Conclusions    - Stress ECG conclusions: There were no stress arrhythmias or     conduction abnormalities. The stress ECG was normal.   - Staged echo: There was no echocardiographic evidence for     stress-induced ischemia.    Cardiac CT: Date: 10/19/2017 Results: IMPRESSION: 1. Coronary calcium score of 120. This was 26 percentile for age and sex matched control. In 2014 calcium score was 40 and represented 80 percentile for age/sex.   2. Normal coronary origin with right dominance.   3. When compared to the prior study from 2014 there is mild progression of CAD with mild worsening of the proximal LAD lesion and ostial ramus intermedius lesion, still non-obstructive. We will obtain additional CT FFR analysis. (Negative)   Recent Labs: 03/18/2021: ALT 17; BUN 10; Creatinine, Ser 0.95; Hemoglobin 13.9; Platelets 265.0; Potassium 4.0; Sodium 140; TSH 2.21  Recent Lipid Panel    Component Value Date/Time   CHOL 101 03/18/2021 1032   CHOL 103 09/23/2020 0939   CHOL 101 (L) 12/24/2015 0750   CHOL 166 10/02/2014 0805   TRIG 58.0 03/18/2021 1032   TRIG 77 12/24/2015 0750   TRIG 97 10/02/2014 0805   HDL 61.00 03/18/2021 1032   HDL 63 09/23/2020 0939   HDL 66 12/24/2015 0750   HDL 50 10/02/2014 0805   CHOLHDL 2 03/18/2021 1032   VLDL 11.6 03/18/2021 1032   LDLCALC 28 03/18/2021 1032   LDLCALC 25 09/23/2020 0939   LDLCALC 20  12/24/2015 0750   LDLCALC 97 10/02/2014 0805   LDLDIRECT 135.0 05/08/2013 0754    Physical Exam:    VS:  BP 122/78   Pulse 75   Ht 5' 9.5" (1.765 m)   Wt 187 lb (84.8 kg)   SpO2 96%   BMI 27.22 kg/m     Wt Readings from Last 3 Encounters:  04/11/21 187 lb (84.8 kg)  03/18/21 183 lb 6.4 oz (83.2 kg)  03/04/21 181 lb (82.1 kg)    Gen: no distress, well nourished Neck: No  JVD, no  carotid bruit Ears: No  Pilar Plate Sign Cardiac: No Rubs or Gallops, no murmur, regular rate, +2 radial pulses Respiratory: Clear to auscultation bilaterally, normal effort, normal  respiratory rate GI: Soft, nontender, non-distended  MS: No  edema;  moves all extremities Integument: Has bilateral nail bed indentations most of his nials, he notes a left thumb blister Neuro:  At time of evaluation, alert and oriented to person/place/time/situation  Psych: Normal affect   ASSESSMENT:    1. Coronary artery disease involving native coronary artery of native heart without angina pectoris   2. Fatigue, unspecified type   3. Familial hypercholesterolemia    PLAN:    Coronary Artery Disease Nonobstructive Non cardiac CP HLD on Praluleunt Fatigue - asymptomatic  - anatomy: mild LAD and RCA - start ASA 81 mg PO daily - continue PCSK9i, goal LDL or at least < 70 - reviewed CT with patient - Anemia panel - we discussed pool related activity and physical therapy; if he is having issues with chest pain and SOB related to his new physical active, we have discussed sterss testing    Will plan for one year follow up unless new symptoms or abnormal test results warranting change in plan         Medication Adjustments/Labs and Tests Ordered: Current medicines are reviewed at length with the patient today.  Concerns regarding medicines are outlined above.  Orders Placed This Encounter  Procedures   Vitamin B12   Folate   Iron and TIBC   Ferritin    No orders of the defined types were placed in  this encounter.   Patient Instructions  Medication Instructions:  Your physician recommends that you continue on your current medications as directed. Please refer to the Current Medication list given to you today.  *If you need a refill on your cardiac medications before your next appointment, please call your pharmacy*   Lab Work: TODAY: anemia panel If you have labs (blood work) drawn today and your tests are completely normal, you will receive your results only by: [place] (if you have MyChart) OR A paper copy in the mail If you have any lab test that is abnormal or we need to change your treatment, we will call you to review the results.   Testing/Procedures: NONE   Follow-Up: At [facility], you and your health needs are our priority.  As part of our continuing mission to provide you with exceptional heart care, we have created designated Provider Care Teams.  These Care Teams include your primary Cardiologist (physician) and Advanced Practice Providers (APPs -  Physician Assistants and Nurse Practitioners) who all work together to provide you with the care you need, when you need it.   Your next appointment:   12 month(s)  The format for your next appointment:   In Person  Provider:   Werner Lean, MD       Signed, Werner Lean, MD  04/11/2021 10:12 AM    [place]

## 2021-04-11 ENCOUNTER — Other Ambulatory Visit: Payer: Self-pay

## 2021-04-11 ENCOUNTER — Ambulatory Visit (INDEPENDENT_AMBULATORY_CARE_PROVIDER_SITE_OTHER): Payer: BC Managed Care – PPO | Admitting: Internal Medicine

## 2021-04-11 ENCOUNTER — Encounter: Payer: Self-pay | Admitting: Internal Medicine

## 2021-04-11 VITALS — BP 122/78 | HR 75 | Ht 69.5 in | Wt 187.0 lb

## 2021-04-11 DIAGNOSIS — R5383 Other fatigue: Secondary | ICD-10-CM | POA: Diagnosis not present

## 2021-04-11 DIAGNOSIS — Z139 Encounter for screening, unspecified: Secondary | ICD-10-CM | POA: Diagnosis not present

## 2021-04-11 DIAGNOSIS — E7801 Familial hypercholesterolemia: Secondary | ICD-10-CM | POA: Diagnosis not present

## 2021-04-11 DIAGNOSIS — I2510 Atherosclerotic heart disease of native coronary artery without angina pectoris: Secondary | ICD-10-CM

## 2021-04-11 NOTE — Patient Instructions (Signed)
Medication Instructions:  Your physician recommends that you continue on your current medications as directed. Please refer to the Current Medication list given to you today.  *If you need a refill on your cardiac medications before your next appointment, please call your pharmacy*   Lab Work: TODAY: anemia panel If you have labs (blood work) drawn today and your tests are completely normal, you will receive your results only by: [place] (if you have MyChart) OR A paper copy in the mail If you have any lab test that is abnormal or we need to change your treatment, we will call you to review the results.   Testing/Procedures: NONE   Follow-Up: At [facility], you and your health needs are our priority.  As part of our continuing mission to provide you with exceptional heart care, we have created designated Provider Care Teams.  These Care Teams include your primary Cardiologist (physician) and Advanced Practice Providers (APPs -  Physician Assistants and Nurse Practitioners) who all work together to provide you with the care you need, when you need it.   Your next appointment:   12 month(s)  The format for your next appointment:   In Person  Provider:   Werner Lean, MD

## 2021-04-12 LAB — IRON AND TIBC
Iron Saturation: 36 % (ref 15–55)
Iron: 100 ug/dL (ref 38–169)
Total Iron Binding Capacity: 274 ug/dL (ref 250–450)
UIBC: 174 ug/dL (ref 111–343)

## 2021-04-12 LAB — FERRITIN: Ferritin: 87 ng/mL (ref 30–400)

## 2021-04-12 LAB — FOLATE: Folate: 11.4 ng/mL (ref >3.0)

## 2021-04-12 LAB — VITAMIN B12: Vitamin B-12: 716 pg/mL (ref 232–1245)

## 2021-04-15 DIAGNOSIS — M898X7 Other specified disorders of bone, ankle and foot: Secondary | ICD-10-CM | POA: Diagnosis not present

## 2021-04-15 DIAGNOSIS — M79672 Pain in left foot: Secondary | ICD-10-CM | POA: Diagnosis not present

## 2021-04-15 DIAGNOSIS — R269 Unspecified abnormalities of gait and mobility: Secondary | ICD-10-CM | POA: Diagnosis not present

## 2021-04-16 DIAGNOSIS — L609 Nail disorder, unspecified: Secondary | ICD-10-CM | POA: Diagnosis not present

## 2021-04-16 DIAGNOSIS — L603 Nail dystrophy: Secondary | ICD-10-CM | POA: Diagnosis not present

## 2021-04-16 DIAGNOSIS — B351 Tinea unguium: Secondary | ICD-10-CM | POA: Diagnosis not present

## 2021-04-22 DIAGNOSIS — H43393 Other vitreous opacities, bilateral: Secondary | ICD-10-CM | POA: Diagnosis not present

## 2021-04-23 DIAGNOSIS — M898X7 Other specified disorders of bone, ankle and foot: Secondary | ICD-10-CM | POA: Diagnosis not present

## 2021-04-23 DIAGNOSIS — R269 Unspecified abnormalities of gait and mobility: Secondary | ICD-10-CM | POA: Diagnosis not present

## 2021-04-23 DIAGNOSIS — M79672 Pain in left foot: Secondary | ICD-10-CM | POA: Diagnosis not present

## 2021-04-29 DIAGNOSIS — R269 Unspecified abnormalities of gait and mobility: Secondary | ICD-10-CM | POA: Diagnosis not present

## 2021-04-29 DIAGNOSIS — M898X7 Other specified disorders of bone, ankle and foot: Secondary | ICD-10-CM | POA: Diagnosis not present

## 2021-04-29 DIAGNOSIS — M79672 Pain in left foot: Secondary | ICD-10-CM | POA: Diagnosis not present

## 2021-05-06 ENCOUNTER — Telehealth: Payer: Self-pay | Admitting: Internal Medicine

## 2021-05-06 MED ORDER — PRALUENT 75 MG/ML ~~LOC~~ SOAJ: 1.0000 "pen " | SUBCUTANEOUS | 11 refills | Status: DC

## 2021-05-06 NOTE — Telephone Encounter (Addendum)
Pt does not need PA, already has active authorization on file. Refill sent to pharmacy with this info.

## 2021-05-06 NOTE — Telephone Encounter (Signed)
Pt c/o medication issue:  1. Name of Medication: Alirocumab ([place]) 75 MG/ML SOAJ  2. How are you currently taking this medication (dosage and times per day)?   3. Are you having a reaction (difficulty breathing--STAT)?   4. What is your medication issue? Jake [name] from [place]  is f/u on PA that was faxed on 05/04/21

## 2021-05-07 DIAGNOSIS — M79672 Pain in left foot: Secondary | ICD-10-CM | POA: Diagnosis not present

## 2021-05-07 DIAGNOSIS — M898X7 Other specified disorders of bone, ankle and foot: Secondary | ICD-10-CM | POA: Diagnosis not present

## 2021-05-07 DIAGNOSIS — R269 Unspecified abnormalities of gait and mobility: Secondary | ICD-10-CM | POA: Diagnosis not present

## 2021-05-14 DIAGNOSIS — L578 Other skin changes due to chronic exposure to nonionizing radiation: Secondary | ICD-10-CM | POA: Diagnosis not present

## 2021-05-14 DIAGNOSIS — L814 Other melanin hyperpigmentation: Secondary | ICD-10-CM | POA: Diagnosis not present

## 2021-05-14 DIAGNOSIS — R269 Unspecified abnormalities of gait and mobility: Secondary | ICD-10-CM | POA: Diagnosis not present

## 2021-05-14 DIAGNOSIS — M79672 Pain in left foot: Secondary | ICD-10-CM | POA: Diagnosis not present

## 2021-05-14 DIAGNOSIS — L570 Actinic keratosis: Secondary | ICD-10-CM | POA: Diagnosis not present

## 2021-05-14 DIAGNOSIS — L821 Other seborrheic keratosis: Secondary | ICD-10-CM | POA: Diagnosis not present

## 2021-05-14 DIAGNOSIS — D1801 Hemangioma of skin and subcutaneous tissue: Secondary | ICD-10-CM | POA: Diagnosis not present

## 2021-05-14 DIAGNOSIS — M898X7 Other specified disorders of bone, ankle and foot: Secondary | ICD-10-CM | POA: Diagnosis not present

## 2021-05-23 DIAGNOSIS — M79672 Pain in left foot: Secondary | ICD-10-CM | POA: Diagnosis not present

## 2021-05-23 DIAGNOSIS — M898X7 Other specified disorders of bone, ankle and foot: Secondary | ICD-10-CM | POA: Diagnosis not present

## 2021-05-23 DIAGNOSIS — R269 Unspecified abnormalities of gait and mobility: Secondary | ICD-10-CM | POA: Diagnosis not present

## 2021-06-16 DIAGNOSIS — R269 Unspecified abnormalities of gait and mobility: Secondary | ICD-10-CM | POA: Diagnosis not present

## 2021-06-16 DIAGNOSIS — M79672 Pain in left foot: Secondary | ICD-10-CM | POA: Diagnosis not present

## 2021-06-16 DIAGNOSIS — M898X7 Other specified disorders of bone, ankle and foot: Secondary | ICD-10-CM | POA: Diagnosis not present

## 2021-07-03 DIAGNOSIS — L4059 Other psoriatic arthropathy: Secondary | ICD-10-CM | POA: Diagnosis not present

## 2021-07-03 DIAGNOSIS — R21 Rash and other nonspecific skin eruption: Secondary | ICD-10-CM | POA: Diagnosis not present

## 2021-07-03 DIAGNOSIS — M130 Polyarthritis, unspecified: Secondary | ICD-10-CM | POA: Diagnosis not present

## 2021-07-09 ENCOUNTER — Telehealth: Payer: Self-pay

## 2021-07-09 NOTE — Telephone Encounter (Signed)
-----   Message from Leeroy Bock, [place] sent at 07/09/2021  3:18 PM EST ----- Can you call pt's pharmacy to see if they can run [place]? Pt left a voicemail saying we have PA on file for Repatha and not Praluent but I'm looking an approval from 05/06/21 that I got for Praluent so I'm not sure what the issue is at the pharmacy. His updated insurance is the [place] card. Thanks!

## 2021-07-09 NOTE — Telephone Encounter (Signed)
Called and spoke w/pharmacy and gave them copay card information bringing the cost down to $25:

## 2021-07-09 NOTE — Telephone Encounter (Signed)
Called and lmom the pt that the praluent issue has been resolved. And to contact us if they have future issues

## 2021-07-15 ENCOUNTER — Other Ambulatory Visit: Payer: Self-pay | Admitting: Physician Assistant

## 2021-07-16 DIAGNOSIS — L301 Dyshidrosis [pompholyx]: Secondary | ICD-10-CM | POA: Diagnosis not present

## 2021-07-16 DIAGNOSIS — Z23 Encounter for immunization: Secondary | ICD-10-CM | POA: Diagnosis not present

## 2021-07-30 DIAGNOSIS — M65841 Other synovitis and tenosynovitis, right hand: Secondary | ICD-10-CM | POA: Diagnosis not present

## 2021-07-30 DIAGNOSIS — M79644 Pain in right finger(s): Secondary | ICD-10-CM | POA: Diagnosis not present

## 2021-07-30 DIAGNOSIS — G5603 Carpal tunnel syndrome, bilateral upper limbs: Secondary | ICD-10-CM | POA: Diagnosis not present

## 2021-07-30 DIAGNOSIS — M65331 Trigger finger, right middle finger: Secondary | ICD-10-CM | POA: Diagnosis not present

## 2021-07-30 DIAGNOSIS — M25531 Pain in right wrist: Secondary | ICD-10-CM | POA: Diagnosis not present

## 2021-08-01 DIAGNOSIS — M79672 Pain in left foot: Secondary | ICD-10-CM | POA: Diagnosis not present

## 2021-08-01 DIAGNOSIS — M898X7 Other specified disorders of bone, ankle and foot: Secondary | ICD-10-CM | POA: Diagnosis not present

## 2021-08-01 DIAGNOSIS — R269 Unspecified abnormalities of gait and mobility: Secondary | ICD-10-CM | POA: Diagnosis not present

## 2021-08-07 DIAGNOSIS — M898X7 Other specified disorders of bone, ankle and foot: Secondary | ICD-10-CM | POA: Diagnosis not present

## 2021-08-07 DIAGNOSIS — M79672 Pain in left foot: Secondary | ICD-10-CM | POA: Diagnosis not present

## 2021-08-07 DIAGNOSIS — R269 Unspecified abnormalities of gait and mobility: Secondary | ICD-10-CM | POA: Diagnosis not present

## 2021-08-20 DIAGNOSIS — M79672 Pain in left foot: Secondary | ICD-10-CM | POA: Diagnosis not present

## 2021-08-20 DIAGNOSIS — M898X7 Other specified disorders of bone, ankle and foot: Secondary | ICD-10-CM | POA: Diagnosis not present

## 2021-08-20 DIAGNOSIS — R269 Unspecified abnormalities of gait and mobility: Secondary | ICD-10-CM | POA: Diagnosis not present

## 2021-08-27 DIAGNOSIS — G5601 Carpal tunnel syndrome, right upper limb: Secondary | ICD-10-CM | POA: Diagnosis not present

## 2021-08-27 DIAGNOSIS — M79644 Pain in right finger(s): Secondary | ICD-10-CM | POA: Diagnosis not present

## 2021-08-27 DIAGNOSIS — M65841 Other synovitis and tenosynovitis, right hand: Secondary | ICD-10-CM | POA: Diagnosis not present

## 2021-08-27 DIAGNOSIS — M25531 Pain in right wrist: Secondary | ICD-10-CM | POA: Diagnosis not present

## 2021-09-03 DIAGNOSIS — R269 Unspecified abnormalities of gait and mobility: Secondary | ICD-10-CM | POA: Diagnosis not present

## 2021-09-03 DIAGNOSIS — M79672 Pain in left foot: Secondary | ICD-10-CM | POA: Diagnosis not present

## 2021-09-03 DIAGNOSIS — M898X7 Other specified disorders of bone, ankle and foot: Secondary | ICD-10-CM | POA: Diagnosis not present

## 2021-09-24 DIAGNOSIS — R269 Unspecified abnormalities of gait and mobility: Secondary | ICD-10-CM | POA: Diagnosis not present

## 2021-09-24 DIAGNOSIS — M79672 Pain in left foot: Secondary | ICD-10-CM | POA: Diagnosis not present

## 2021-09-24 DIAGNOSIS — M898X7 Other specified disorders of bone, ankle and foot: Secondary | ICD-10-CM | POA: Diagnosis not present

## 2021-10-09 DIAGNOSIS — L4059 Other psoriatic arthropathy: Secondary | ICD-10-CM | POA: Diagnosis not present

## 2021-10-09 DIAGNOSIS — R21 Rash and other nonspecific skin eruption: Secondary | ICD-10-CM | POA: Diagnosis not present

## 2021-10-09 DIAGNOSIS — M1991 Primary osteoarthritis, unspecified site: Secondary | ICD-10-CM | POA: Diagnosis not present

## 2021-10-14 ENCOUNTER — Telehealth: Payer: Self-pay | Admitting: Family Medicine

## 2021-10-14 MED ORDER — ALBUTEROL SULFATE HFA 108 (90 BASE) MCG/ACT IN AERS: INHALATION_SPRAY | RESPIRATORY_TRACT | 2 refills | Status: DC

## 2021-10-14 NOTE — Telephone Encounter (Signed)
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  ?[place], [place] Phone:  [phone]  ?Fax:  [phone]  ?  ? ?

## 2021-10-15 MED ORDER — EPINEPHRINE 0.3 MG/0.3ML IJ SOAJ: 0.3000 mg | INTRAMUSCULAR | 2 refills | Status: DC | PRN

## 2021-10-15 NOTE — Telephone Encounter (Signed)
Rx sent 

## 2021-10-30 DIAGNOSIS — M65841 Other synovitis and tenosynovitis, right hand: Secondary | ICD-10-CM | POA: Diagnosis not present

## 2021-10-30 DIAGNOSIS — G5603 Carpal tunnel syndrome, bilateral upper limbs: Secondary | ICD-10-CM | POA: Diagnosis not present

## 2021-10-30 DIAGNOSIS — M65331 Trigger finger, right middle finger: Secondary | ICD-10-CM | POA: Diagnosis not present

## 2021-11-24 DIAGNOSIS — N400 Enlarged prostate without lower urinary tract symptoms: Secondary | ICD-10-CM | POA: Diagnosis not present

## 2021-11-24 DIAGNOSIS — N509 Disorder of male genital organs, unspecified: Secondary | ICD-10-CM | POA: Diagnosis not present

## 2021-11-24 DIAGNOSIS — K649 Unspecified hemorrhoids: Secondary | ICD-10-CM | POA: Diagnosis not present

## 2021-11-24 DIAGNOSIS — N5201 Erectile dysfunction due to arterial insufficiency: Secondary | ICD-10-CM | POA: Diagnosis not present

## 2021-12-23 ENCOUNTER — Other Ambulatory Visit: Payer: Self-pay

## 2021-12-23 MED ORDER — PRALUENT 75 MG/ML ~~LOC~~ SOAJ: 1.0000 "pen " | SUBCUTANEOUS | 11 refills | Status: DC

## 2022-02-23 DIAGNOSIS — M65331 Trigger finger, right middle finger: Secondary | ICD-10-CM | POA: Diagnosis not present

## 2022-02-23 DIAGNOSIS — G5603 Carpal tunnel syndrome, bilateral upper limbs: Secondary | ICD-10-CM | POA: Diagnosis not present

## 2022-02-23 DIAGNOSIS — M65841 Other synovitis and tenosynovitis, right hand: Secondary | ICD-10-CM | POA: Diagnosis not present

## 2022-03-02 ENCOUNTER — Telehealth: Payer: Self-pay | Admitting: Family Medicine

## 2022-03-02 NOTE — Telephone Encounter (Signed)
Pt is calling and would like to know if he is due for any PNA vaccine

## 2022-03-03 NOTE — Telephone Encounter (Signed)
Left message for pt to return my call.

## 2022-03-03 NOTE — Telephone Encounter (Signed)
Pt called, returning CMA's call. CMA was unavailable. Pt asked that CMA call back at his earliest convenience. 

## 2022-03-03 NOTE — Telephone Encounter (Signed)
Patient informed of the results and verbalized understanding 

## 2022-03-13 DIAGNOSIS — H9313 Tinnitus, bilateral: Secondary | ICD-10-CM | POA: Diagnosis not present

## 2022-03-13 DIAGNOSIS — H903 Sensorineural hearing loss, bilateral: Secondary | ICD-10-CM | POA: Diagnosis not present

## 2022-03-13 DIAGNOSIS — G253 Myoclonus: Secondary | ICD-10-CM | POA: Diagnosis not present

## 2022-03-30 ENCOUNTER — Ambulatory Visit (INDEPENDENT_AMBULATORY_CARE_PROVIDER_SITE_OTHER): Payer: BC Managed Care – PPO | Admitting: Family Medicine

## 2022-03-30 ENCOUNTER — Encounter: Payer: Self-pay | Admitting: Family Medicine

## 2022-03-30 VITALS — BP 116/70 | HR 70 | Temp 98.1°F | Ht 69.5 in | Wt 174.7 lb

## 2022-03-30 DIAGNOSIS — S61237A Puncture wound without foreign body of left little finger without damage to nail, initial encounter: Secondary | ICD-10-CM | POA: Diagnosis not present

## 2022-03-30 DIAGNOSIS — W458XXD Other foreign body or object entering through skin, subsequent encounter: Secondary | ICD-10-CM | POA: Diagnosis not present

## 2022-03-30 NOTE — Progress Notes (Signed)
Established Patient Office Visit  Subjective   Patient ID: Jake [name], male    DOB: [birth year]  Age: 61 y.o. MRN: [id number]  Chief Complaint  Patient presents with   Injury    Patient states a fishing hook got caught in the left 5th digit 4 days ago, states a friend of his used Lidocaine and removed the hook    HPI   Jake [name] is seen following a fishing hook injury.  [place] and was stuck with a hook left fifth digit.  This was in the distal pulp.  He had an EMT person on the trip he had brought Xylocaine and basically they numbed his finger and pushed the hook through and clipped off the barb.  Patient applied some topical antibiotic.  No signs of secondary infection.  He was uncertain regarding his last tetanus but it turns out this was in 2019.  No problems with the finger since then.  Past Medical History:  Diagnosis Date   Allergy    Arthritis    Asthma    CAD in native artery    a. nonobstructive by cardiac CTA.   Cataract    Coronary artery disease    GERD (gastroesophageal reflux disease)    Hyperlipidemia    Neuromuscular disorder (HCC)    carpal tunnel bilateral wrists   Pulmonary nodules    Sinus bradycardia    Past Surgical History:  Procedure Laterality Date   COLONOSCOPY     INGUINAL HERNIA REPAIR  2009   LUMBAR LAMINECTOMY  1997   NECK SURGERY  01/2010   c5-7   VASECTOMY      reports that he quit smoking about 29 years ago. His smoking use included cigarettes. He has a 10.00 pack-year smoking history. He has never used smokeless tobacco. He reports current alcohol use of about 10.0 standard drinks of alcohol per week. He reports that he does not use drugs. family history includes Alcohol abuse in his brother and another family member; Asthma in an other family member; Cancer in his mother; Cancer (age of onset: 71) in his father; Colon cancer (age of onset: 63) in his paternal grandmother; Depression in an  other family member; Heart disease in his brother and father; Osteoporosis in his mother; Parkinson's disease in his father; Schizophrenia in his brother; Valvular heart disease in an other family member. Allergies  Allergen Reactions   Latex     REACTION: RASH   Lipitor [Atorvastatin]     Muscle aches at 20 mg     Other Rash    Wool pants caused rash    Zocor [Simvastatin]     Muscle aches at 20 mg daily   Crestor [Rosuvastatin Calcium]     Muscle aches   Gluten Meal     Digestive symptoms    Review of Systems  Constitutional:  Negative for chills and fever.      Objective:     BP 116/70 (BP Location: Left Arm, Patient Position: Sitting, Cuff Size: Normal)   Pulse 70   Temp 98.1 F (36.7 C) (Oral)   Ht 5' 9.5" (1.765 m)   Wt 174 lb 11.2 oz (79.2 kg)   SpO2 97%   BMI 25.43 kg/m    Physical Exam Vitals reviewed.  Constitutional:      Appearance: Normal appearance.  Cardiovascular:     Rate and Rhythm: Normal rate and regular rhythm.  Musculoskeletal:  Comments: Left fifth digit reveals a couple of puncture wounds both distal to the DIP joint.  Full range of motion all joints of the hand.  No erythema.  No warmth.  No visible swelling.  Nontender.  Neurological:     Mental Status: He is alert.      No results found for any visits on 03/30/22.    The ASCVD Risk score (Arnett DK, et al., 2019) failed to calculate for the following reasons:   The valid total cholesterol range is 130 to 320 mg/dL    Assessment & Plan:   Problem List Items Addressed This Visit   None Visit Diagnoses     Fishing hook foreign body, subsequent encounter    -  Primary     Left fifth digit pain.  Recent foreign body with hook to the finger.  Tetanus up-to-date.  No signs of secondary infection.  Reassurance and observation  No follow-ups on file.    Carolann Littler, MD

## 2022-03-30 NOTE — Patient Instructions (Signed)
Set up physical soon.

## 2022-04-13 DIAGNOSIS — M1991 Primary osteoarthritis, unspecified site: Secondary | ICD-10-CM | POA: Diagnosis not present

## 2022-04-13 DIAGNOSIS — R21 Rash and other nonspecific skin eruption: Secondary | ICD-10-CM | POA: Diagnosis not present

## 2022-04-13 DIAGNOSIS — L4059 Other psoriatic arthropathy: Secondary | ICD-10-CM | POA: Diagnosis not present

## 2022-04-14 ENCOUNTER — Ambulatory Visit (INDEPENDENT_AMBULATORY_CARE_PROVIDER_SITE_OTHER): Payer: BC Managed Care – PPO | Admitting: Family Medicine

## 2022-04-14 VITALS — BP 122/82 | HR 89 | Temp 97.5°F | Ht 69.0 in | Wt 172.1 lb

## 2022-04-14 DIAGNOSIS — Z0000 Encounter for general adult medical examination without abnormal findings: Secondary | ICD-10-CM

## 2022-04-14 LAB — HEPATIC FUNCTION PANEL
ALT: 15 U/L (ref 0–53)
AST: 14 U/L (ref 0–37)
Albumin: 4.6 g/dL (ref 3.5–5.2)
Alkaline Phosphatase: 68 U/L (ref 39–117)
Bilirubin, Direct: 0.2 mg/dL (ref 0.0–0.3)
Total Bilirubin: 0.8 mg/dL (ref 0.2–1.2)
Total Protein: 7.2 g/dL (ref 6.0–8.3)

## 2022-04-14 LAB — CBC WITH DIFFERENTIAL/PLATELET
Basophils Absolute: 0.1 10*3/uL (ref 0.0–0.1)
Basophils Relative: 0.6 % (ref 0.0–3.0)
Eosinophils Absolute: 0.3 10*3/uL (ref 0.0–0.7)
Eosinophils Relative: 3.1 % (ref 0.0–5.0)
HCT: 48.6 % (ref 39.0–52.0)
Hemoglobin: 16.6 g/dL (ref 13.0–17.0)
Lymphocytes Relative: 25.7 % (ref 12.0–46.0)
Lymphs Abs: 2.4 10*3/uL (ref 0.7–4.0)
MCHC: 34.1 g/dL (ref 30.0–36.0)
MCV: 91.3 fl (ref 78.0–100.0)
Monocytes Absolute: 0.8 10*3/uL (ref 0.1–1.0)
Monocytes Relative: 8.3 % (ref 3.0–12.0)
Neutro Abs: 5.8 10*3/uL (ref 1.4–7.7)
Neutrophils Relative %: 62.3 % (ref 43.0–77.0)
Platelets: 322 10*3/uL (ref 150.0–400.0)
RBC: 5.33 Mil/uL (ref 4.22–5.81)
RDW: 13.1 % (ref 11.5–15.5)
WBC: 9.4 10*3/uL (ref 4.0–10.5)

## 2022-04-14 LAB — LIPID PANEL
Cholesterol: 114 mg/dL (ref 0–200)
HDL: 73.1 mg/dL (ref >39.00)
LDL Cholesterol: 19 mg/dL (ref 0–99)
NonHDL: 40.71
Total CHOL/HDL Ratio: 2
Triglycerides: 109 mg/dL (ref 0.0–149.0)
VLDL: 21.8 mg/dL (ref 0.0–40.0)

## 2022-04-14 LAB — BASIC METABOLIC PANEL
BUN: 14 mg/dL (ref 6–23)
CO2: 33 mEq/L — ABNORMAL HIGH (ref 19–32)
Calcium: 9.6 mg/dL (ref 8.4–10.5)
Chloride: 100 mEq/L (ref 96–112)
Creatinine, Ser: 0.94 mg/dL (ref 0.40–1.50)
GFR: 87.17 mL/min (ref >60.00)
Glucose, Bld: 92 mg/dL (ref 70–99)
Potassium: 4.9 mEq/L (ref 3.5–5.1)
Sodium: 138 mEq/L (ref 135–145)

## 2022-04-14 LAB — PSA: PSA: 1.07 ng/mL (ref 0.10–4.00)

## 2022-04-14 LAB — TSH: TSH: 1.29 u[IU]/mL (ref 0.35–5.50)

## 2022-04-14 MED ORDER — ALBUTEROL SULFATE HFA 108 (90 BASE) MCG/ACT IN AERS: INHALATION_SPRAY | RESPIRATORY_TRACT | 2 refills | Status: DC

## 2022-04-14 MED ORDER — SILDENAFIL CITRATE 50 MG PO TABS: 50.0000 mg | ORAL_TABLET | Freq: Every day | ORAL | 11 refills | Status: DC | PRN

## 2022-04-14 NOTE — Progress Notes (Signed)
Established Patient Office Visit  Subjective   Patient ID: Jake [name], male    DOB: September 06, 1960  Age: 61 y.o. MRN: [id number]  Chief Complaint  Patient presents with   Annual Exam    Fasting    HPI   Jake [name] is seen for complete physical.  Generally doing well.  He has chronic problems including hyperlipidemia, nonobstructive CAD, history of GERD, history of Schatzki's ring.  Also has history of severe eczema.  Followed by rheumatologist and on [place].  He takes Praluent and Zetia for his lipids.  Does not take Prilosec regularly.  No active reflux symptoms.  He has lost some weight since last year which he states is intentional.  Health maintenance reviewed:  -Flu vaccine already given -Prior hepatitis C antibody negative -Tetanus due 2029 -Has had Zostavax.  Declines Shingrix.  Family history reviewed-mother had some type of "blood cancer.  He does not think this was leukemia.  Father died of glioblastoma age 53.  His father also had history of heart disease and Parkinson's.  He had a brother that had alcohol abuse history and died of complications from that.  Other brother had schizophrenia   Social history-married with 4 sons.  Multiple hobbies.  Never smoked.  No regular alcohol use.  No grandchildren yet.  He enjoys bonsai especially but has many other hobbies including beekeeping  Past Medical History:  Diagnosis Date   Allergy    Arthritis    Asthma    CAD in native artery    a. nonobstructive by cardiac CTA.   Cataract    Coronary artery disease    GERD (gastroesophageal reflux disease)    Hyperlipidemia    Neuromuscular disorder (HCC)    carpal tunnel bilateral wrists   Pulmonary nodules    Sinus bradycardia    Past Surgical History:  Procedure Laterality Date   COLONOSCOPY     INGUINAL HERNIA REPAIR  2009   LUMBAR LAMINECTOMY  1997   NECK SURGERY  01/2010   c5-7   VASECTOMY      reports that he quit smoking about 29 years ago. His smoking use  included cigarettes. He has a 10.00 pack-year smoking history. He has never used smokeless tobacco. He reports current alcohol use of about 10.0 standard drinks of alcohol per week. He reports that he does not use drugs. family history includes Alcohol abuse in his brother and another family member; Asthma in an other family member; Cancer in his mother; Cancer (age of onset: 36) in his father; Colon cancer (age of onset: 19) in his paternal grandmother; Depression in an other family member; Heart disease in his brother and father; Osteoporosis in his mother; Parkinson's disease in his father; Schizophrenia in his brother; Valvular heart disease in an other family member. Allergies  Allergen Reactions   Latex     REACTION: RASH   Lipitor [Atorvastatin]     Muscle aches at 20 mg     Other Rash    Wool pants caused rash    Zocor [Simvastatin]     Muscle aches at 20 mg daily   Crestor [Rosuvastatin Calcium]     Muscle aches   Gluten Meal     Digestive symptoms      Review of Systems  Constitutional:  Negative for chills, fever, malaise/fatigue and weight loss.  HENT:  Negative for hearing loss.   Eyes:  Negative for blurred vision and double vision.  Respiratory:  Negative for cough and shortness of  breath.   Cardiovascular:  Negative for chest pain, palpitations and leg swelling.  Gastrointestinal:  Negative for abdominal pain, blood in stool, constipation and diarrhea.  Genitourinary:  Negative for dysuria.  Skin:  Negative for rash.  Neurological:  Negative for dizziness, speech change, seizures, loss of consciousness and headaches.  Psychiatric/Behavioral:  Negative for depression.       Objective:     BP 122/82 (BP Location: Right Arm, Patient Position: Sitting, Cuff Size: Normal)   Pulse 89   Temp (!) 97.5 F (36.4 C) (Oral)   Ht '5\' 9"'$  (1.753 m)   Wt 172 lb 1.6 oz (78.1 kg)   SpO2 98%   BMI 25.41 kg/m  BP Readings from Last 3 Encounters:  04/14/22 122/82  03/30/22  116/70  04/11/21 122/78   Wt Readings from Last 3 Encounters:  04/14/22 172 lb 1.6 oz (78.1 kg)  03/30/22 174 lb 11.2 oz (79.2 kg)  04/11/21 187 lb (84.8 kg)      Physical Exam Vitals reviewed.  Constitutional:      General: He is not in acute distress.    Appearance: He is well-developed.  HENT:     Head: Normocephalic and atraumatic.     Right Ear: External ear normal.     Left Ear: External ear normal.  Eyes:     Conjunctiva/sclera: Conjunctivae normal.     Pupils: Pupils are equal, round, and reactive to light.  Neck:     Thyroid: No thyromegaly.  Cardiovascular:     Rate and Rhythm: Normal rate and regular rhythm.     Heart sounds: Normal heart sounds. No murmur heard. Pulmonary:     Effort: No respiratory distress.     Breath sounds: No wheezing or rales.  Abdominal:     General: Bowel sounds are normal. There is no distension.     Palpations: Abdomen is soft.     Tenderness: There is no abdominal tenderness. There is no guarding or rebound.     Comments: He has small ventral hernia which is soft and nontender  Musculoskeletal:     Cervical back: Normal range of motion and neck supple.  Lymphadenopathy:     Cervical: No cervical adenopathy.  Skin:    Findings: No rash.  Neurological:     Mental Status: He is alert and oriented to person, place, and time.     Cranial Nerves: No cranial nerve deficit.     Deep Tendon Reflexes: Reflexes normal.      No results found for any visits on 04/14/22.    The ASCVD Risk score (Arnett DK, et al., 2019) failed to calculate for the following reasons:   The valid total cholesterol range is 130 to 320 mg/dL    Assessment & Plan:   Problem List Items Addressed This Visit   None Visit Diagnoses     Physical exam    -  Primary   Relevant Orders   Basic metabolic panel   Lipid panel   CBC with Differential/Platelet   TSH   Hepatic function panel   PSA     Generally healthy 61 year old male.  He has history of  mild nonobstructive CAD.  On Praluent and Zetia. -Flu vaccine already given -Tetanus up-to-date -Colonoscopy up-to-date -Check labs as above -Discussed Shingrix and he will consider at some point possibly  No follow-ups on file.    Carolann Littler, MD

## 2022-04-15 ENCOUNTER — Encounter: Payer: BC Managed Care – PPO | Admitting: Family Medicine

## 2022-04-15 DIAGNOSIS — R1031 Right lower quadrant pain: Secondary | ICD-10-CM | POA: Diagnosis not present

## 2022-04-21 ENCOUNTER — Other Ambulatory Visit: Payer: Self-pay | Admitting: Surgery

## 2022-04-21 DIAGNOSIS — R1031 Right lower quadrant pain: Secondary | ICD-10-CM

## 2022-04-22 ENCOUNTER — Encounter: Payer: Self-pay | Admitting: Internal Medicine

## 2022-04-22 ENCOUNTER — Telehealth (HOSPITAL_COMMUNITY): Payer: Self-pay | Admitting: *Deleted

## 2022-04-22 ENCOUNTER — Ambulatory Visit: Payer: BC Managed Care – PPO | Attending: Internal Medicine | Admitting: Internal Medicine

## 2022-04-22 VITALS — BP 126/74 | HR 78 | Ht 69.0 in | Wt 174.8 lb

## 2022-04-22 DIAGNOSIS — J4520 Mild intermittent asthma, uncomplicated: Secondary | ICD-10-CM

## 2022-04-22 DIAGNOSIS — E782 Mixed hyperlipidemia: Secondary | ICD-10-CM

## 2022-04-22 DIAGNOSIS — I2510 Atherosclerotic heart disease of native coronary artery without angina pectoris: Secondary | ICD-10-CM | POA: Diagnosis not present

## 2022-04-22 DIAGNOSIS — R072 Precordial pain: Secondary | ICD-10-CM | POA: Diagnosis not present

## 2022-04-22 NOTE — Telephone Encounter (Signed)
Patient given detailed instructions per Myocardial Perfusion Study Information Sheet for the test on 04/28/2022 at 8:00. Patient notified to arrive 15 minutes early and that it is imperative to arrive on time for appointment to keep from having the test rescheduled.  If you need to cancel or reschedule your appointment, please call the office within 24 hours of your appointment. . Patient verbalized understanding.Jake [name]

## 2022-04-22 NOTE — Patient Instructions (Addendum)
Medication Instructions:  Your physician recommends that you continue on your current medications as directed. Please refer to the Current Medication list given to you today.  *If you need a refill on your cardiac medications before your next appointment, please call your pharmacy*  Testing/Procedures: Your physician has requested that you have en exercise stress myoview. For further information please visit [email or web address]. Please follow instruction sheet, as given.   Your physician has recommended that you have a pulmonary function test. Pulmonary Function Tests are a group of tests that measure how well air moves in and out of your lungs.   Follow-Up: At [facility] you and your health needs are our priority.  As part of our continuing mission to provide you with exceptional heart care, we have created designated Provider Care Teams.  These Care Teams include your primary Cardiologist (physician) and Advanced Practice Providers (APPs -  Physician Assistants and Nurse Practitioners) who all work together to provide you with the care you need, when you need it.  Your next appointment:   1 year(s)  The format for your next appointment:   In Person  Provider:   Werner Lean, MD   Other Instructions Please arrive 15 minutes prior to your appointment time for registration and insurance purposes.  The test will take approximately 45 minutes to complete.  How to prepare for your Exercise Stress Test: Do bring a list of your current medications with you.  If not listed below, you may take your medications as normal. Do wear comfortable clothes (no dresses or overalls) and walking shoes, tennis shoes preferred (no heels or open toed shoes are allowed) Do Not wear cologne, perfume, aftershave or lotions (deodorant is allowed). Please report to [place], [address] for your test.  If these instructions are not followed, your test will have to be  rescheduled.  If you have questions or concerns about your appointment, you can call the Stress Lab at [phone].  If you cannot keep your appointment, please provide 24 hours notification to the Stress Lab, to avoid a possible $50 charge to your account.  Important Information About Sugar

## 2022-04-22 NOTE — Progress Notes (Signed)
Cardiology Office Note:    Date:  04/22/2022   ID:  Jake [name], DOB 14-Dec-1960, MRN [id number]  PCP:  Eulas Post, MD   [facility] Providers Cardiologist:  Werner Lean, MD     Referring MD: Eulas Post, MD   CC: CP  History of Present Illness:    Jake [name] is a 61 y.o. male with a hx of Non obstructive CAD; HLD on Praluent, Asthma who presents for evaluation 04/11/21. 2023: Achilles tendon PT; back on Praulent  Patient notes that he is doing better. He can run- if he is chased.     LDL 19. Since last visit notes that he is over all doing well . There are no interval hospital/ED visit.    Still having chest fluttering.   A little bit achy. Since having COVID-19 still doesn't feel like he can't get a good breath; he describes this as a yawn that is stuck in the 11th hour. Helps with inhaler but  Notes DOE and no PND/Orthopnea.  No weight gain or leg swelling.  He is seeing surgery for a hernia evaluation.    Past Medical History:  Diagnosis Date   Allergy    Arthritis    Asthma    CAD in native artery    a. nonobstructive by cardiac CTA.   Cataract    Coronary artery disease    GERD (gastroesophageal reflux disease)    Hyperlipidemia    Neuromuscular disorder (HCC)    carpal tunnel bilateral wrists   Pulmonary nodules    Sinus bradycardia     Past Surgical History:  Procedure Laterality Date   COLONOSCOPY     INGUINAL HERNIA REPAIR  2009   LUMBAR LAMINECTOMY  1997   NECK SURGERY  01/2010   c5-7   VASECTOMY      Current Medications: Current Meds  Medication Sig   albuterol (VENTOLIN HFA) 108 (90 Base) MCG/ACT inhaler INHALE 2 PUFFS EVERY 6 HOURS AS NEEDED FOR COUGH, WHEEZE, SHORTNESS OF BREATH.   Alirocumab (PRALUENT) 75 MG/ML SOAJ Inject 1 pen  into the skin every 14 (fourteen) days.   diclofenac Sodium (VOLTAREN) 1 % GEL APPLY 2 GRAMS TO THE AFFECTED AREA(S) BY TOPICAL ROUTE 4 TIMES PER DAY   EPINEPHrine 0.3  mg/0.3 mL IJ SOAJ injection Inject 0.3 mg into the muscle as needed for anaphylaxis.   ezetimibe (ZETIA) 10 MG tablet TAKE ONE TABLET BY MOUTH ONCE DAILY.   fluticasone (FLONASE) 50 MCG/ACT nasal spray Place 2 sprays into both nostrils as needed for allergies or rhinitis.   Ibuprofen (ADVIL PO) Take by mouth as needed.   Loperamide HCl (IMODIUM PO) Take by mouth as needed.   Multiple Vitamins-Minerals (MULTIVITAMIN ADULT PO) daily.    omeprazole (PRILOSEC) 40 MG capsule Take one capsule shortly before breakfast each day.   OTEZLA 30 MG TABS Take 1 tablet by mouth 2 (two) times daily.   sildenafil (VIAGRA) 50 MG tablet Take 1 tablet (50 mg total) by mouth daily as needed for erectile dysfunction.     Allergies:   Latex, Lipitor [atorvastatin], Other, Zocor [simvastatin], Crestor [rosuvastatin calcium], and Gluten meal   Social History   Socioeconomic History   Marital status: Married    Spouse name: [name]   Number of children: 4   Years of education: BA   Highest education level: Not on file  Occupational History   Occupation: [employer]  Tobacco Use   Smoking status: Former  Packs/day: 0.50    Years: 20.00    Total pack years: 10.00    Types: Cigarettes    Quit date: 06/22/1992    Years since quitting: 29.8   Smokeless tobacco: Never  Vaping Use   Vaping Use: Never used  Substance and Sexual Activity   Alcohol use: Yes    Alcohol/week: 10.0 standard drinks of alcohol    Types: 10 Standard drinks or equivalent per week   Drug use: No   Sexual activity: Not on file  Other Topics Concern   Not on file  Social History Narrative   Patient is married [name]) and has four children.   Patient lives at home with his wife and children.   Patient is working  At [employer].   Patient has a BA degree.   Patient used both hands.   Patient drinks three cups of coffee daily.   Social Determinants of Health   Financial Resource Strain: Not on file  Food  Insecurity: Not on file  Transportation Needs: Not on file  Physical Activity: Not on file  Stress: Not on file  Social Connections: Not on file    Social: had Achilles issues in 2022.  He is a Doctor, general practice.   Family History: The patient's family history includes Alcohol abuse in his brother and another family member; Asthma in an other family member; Cancer in his mother; Cancer (age of onset: 25) in his father; Colon cancer (age of onset: 85) in his paternal grandmother; Depression in an other family member; Heart disease in his brother and father; Osteoporosis in his mother; Parkinson's disease in his father; Schizophrenia in his brother; Valvular heart disease in an other family member. There is no history of Esophageal cancer, Stomach cancer, or Rectal cancer.  ROS:   Please see the history of present illness.     All other systems reviewed and are negative.  EKGs/Labs/Other Studies Reviewed:    EKG 04/22/22: SR with 1st HB  Stress Echocardiogram: Date: 04/19/2020 Results:Report only  Study Conclusions    - Stress ECG conclusions: There were no stress arrhythmias or     conduction abnormalities. The stress ECG was normal.   - Staged echo: There was no echocardiographic evidence for     stress-induced ischemia.    Cardiac CT: Date: 10/19/2017 Results: IMPRESSION: 1. Coronary calcium score of 120. This was 38 percentile for age and sex matched control. In 2014 calcium score was 40 and represented 80 percentile for age/sex.   2. Normal coronary origin with right dominance.   3. When compared to the prior study from 2014 there is mild progression of CAD with mild worsening of the proximal LAD lesion and ostial ramus intermedius lesion, still non-obstructive. We will obtain additional CT FFR analysis. (Negative)   Recent Labs: 04/14/2022: ALT 15; BUN 14; Creatinine, Ser 0.94; Hemoglobin 16.6; Platelets 322.0; Potassium 4.9; Sodium 138; TSH 1.29  Recent Lipid Panel     Component Value Date/Time   CHOL 114 04/14/2022 1109   CHOL 103 09/23/2020 0939   CHOL 101 (L) 12/24/2015 0750   CHOL 166 10/02/2014 0805   TRIG 109.0 04/14/2022 1109   TRIG 77 12/24/2015 0750   TRIG 97 10/02/2014 0805   HDL 73.10 04/14/2022 1109   HDL 63 09/23/2020 0939   HDL 66 12/24/2015 0750   HDL 50 10/02/2014 0805   CHOLHDL 2 04/14/2022 1109   VLDL 21.8 04/14/2022 1109   LDLCALC 19 04/14/2022 1109   LDLCALC 25  09/23/2020 0939   LDLCALC 20 12/24/2015 0750   LDLCALC 97 10/02/2014 0805   LDLDIRECT 135.0 05/08/2013 0754    Physical Exam:    VS:  BP 126/74   Pulse 78   Ht '5\' 9"'$  (1.753 m)   Wt 174 lb 12.8 oz (79.3 kg)   SpO2 99%   BMI 25.81 kg/m     Wt Readings from Last 3 Encounters:  04/22/22 174 lb 12.8 oz (79.3 kg)  04/14/22 172 lb 1.6 oz (78.1 kg)  03/30/22 174 lb 11.2 oz (79.2 kg)    Gen: no distress, well nourished Neck: No JVD Ears: No  Pilar Plate Sign Cardiac: No Rubs or Gallops, no murmur, regular rate, +2 radial pulses Respiratory: Clear to auscultation bilaterally, normal effort, normal  respiratory rate GI: Soft, nontender, non-distended  MS: No  edema;  moves all extremities Neuro:  At time of evaluation, alert and oriented to person/place/time/situation  Psych: Normal affect  ASSESSMENT:    1. Precordial chest pain   2. Coronary artery disease involving native coronary artery of native heart without angina pectoris   3. Mixed hyperlipidemia   4. Mild intermittent asthma without complication    PLAN:    Coronary Artery Disease Nonobstructive HLD on Praluleunt Fatigue SR with 1st HB - chest pain has persisted; we discussed that if this was the case we would pursue stress testing; he is also pending a abdominal surgery; will get NM stress test (should be able to exercise, otherwise he only haw 1st HB; he could get Lexiscan) - asymptomatic  - anatomy: mild LAD and RCA - ASA 81 mg PO daily - continue PCSK9i, goal LDL or at least < 70  Mild  intermittent asthma - no active wheezing; ok for the above - will get PFTs  Will plan for one year follow up unless new symptoms or abnormal test results warranting change in plan         Medication Adjustments/Labs and Tests Ordered: Current medicines are reviewed at length with the patient today.  Concerns regarding medicines are outlined above.  Orders Placed This Encounter  Procedures   Cardiac Stress Test: Informed Consent Details: Physician/Practitioner Attestation; Transcribe to consent form and obtain patient signature   MYOCARDIAL PERFUSION IMAGING   EKG 12-Lead   Pulmonary Function Test    No orders of the defined types were placed in this encounter.    Patient Instructions  Medication Instructions:  Your physician recommends that you continue on your current medications as directed. Please refer to the Current Medication list given to you today.  *If you need a refill on your cardiac medications before your next appointment, please call your pharmacy*  Testing/Procedures: Your physician has requested that you have en exercise stress myoview. For further information please visit [email or web address]. Please follow instruction sheet, as given.   Your physician has recommended that you have a pulmonary function test. Pulmonary Function Tests are a group of tests that measure how well air moves in and out of your lungs.   Follow-Up: At [facility], you and your health needs are our priority.  As part of our continuing mission to provide you with exceptional heart care, we have created designated Provider Care Teams.  These Care Teams include your primary Cardiologist (physician) and Advanced Practice Providers (APPs -  Physician Assistants and Nurse Practitioners) who all work together to provide you with the care you need, when you need it.  Your next appointment:   1 year(s)  The format  for your next appointment:   In Person  Provider:   Werner Lean, MD   Other Instructions Please arrive 15 minutes prior to your appointment time for registration and insurance purposes.  The test will take approximately 45 minutes to complete.  How to prepare for your Exercise Stress Test: Do bring a list of your current medications with you.  If not listed below, you may take your medications as normal. Do wear comfortable clothes (no dresses or overalls) and walking shoes, tennis shoes preferred (no heels or open toed shoes are allowed) Do Not wear cologne, perfume, aftershave or lotions (deodorant is allowed). Please report to [place], [address] for your test.  If these instructions are not followed, your test will have to be rescheduled.  If you have questions or concerns about your appointment, you can call the Stress Lab at [phone].  If you cannot keep your appointment, please provide 24 hours notification to the Stress Lab, to avoid a possible $50 charge to your account.  Important Information About Sugar         Signed, Werner Lean, MD  04/22/2022 9:34 AM    [facility]

## 2022-04-28 ENCOUNTER — Encounter (HOSPITAL_COMMUNITY): Payer: BC Managed Care – PPO

## 2022-04-29 ENCOUNTER — Telehealth (HOSPITAL_COMMUNITY): Payer: Self-pay | Admitting: *Deleted

## 2022-04-29 NOTE — Telephone Encounter (Signed)
Patient given detailed instructions per Myocardial Perfusion Study Information Sheet for the test on 112/5/23 Patient notified to arrive 15 minutes early and that it is imperative to arrive on time for appointment to keep from having the test rescheduled.  If you need to cancel or reschedule your appointment, please call the office within 24 hours of your appointment. . Patient verbalized understanding.Kirstie Peri

## 2022-04-30 ENCOUNTER — Ambulatory Visit (INDEPENDENT_AMBULATORY_CARE_PROVIDER_SITE_OTHER): Payer: BC Managed Care – PPO | Admitting: Nurse Practitioner

## 2022-04-30 ENCOUNTER — Encounter: Payer: Self-pay | Admitting: Nurse Practitioner

## 2022-04-30 VITALS — BP 126/84 | HR 79 | Temp 98.2°F | Wt 173.2 lb

## 2022-04-30 DIAGNOSIS — J069 Acute upper respiratory infection, unspecified: Secondary | ICD-10-CM

## 2022-04-30 LAB — POCT INFLUENZA A/B
Influenza A, POC: NEGATIVE
Influenza B, POC: NEGATIVE

## 2022-04-30 MED ORDER — PREDNISONE 10 MG PO TABS: ORAL_TABLET | ORAL | 0 refills | Status: DC

## 2022-04-30 MED ORDER — HYDROCOD POLI-CHLORPHE POLI ER 10-8 MG/5ML PO SUER: 5.0000 mL | Freq: Two times a day (BID) | ORAL | 0 refills | Status: DC | PRN

## 2022-04-30 NOTE — Progress Notes (Signed)
Acute Office Visit  Subjective:     Patient ID: Jake [name], male    DOB: Jun 23, 1960, 61 y.o.   MRN: [id number]  Chief Complaint  Patient presents with   Cough    Pt c/o chronic coughing headache chest congestion mild chest pressure sinus congestion  x 7 days states has taken 2 covid test both were negative.    HPI Patient is in today for coughing, chest congestion, and headache for the last week. He started feeling better, and then his symptoms worsened again.   UPPER RESPIRATORY TRACT INFECTION  Fever: yes - subjective Cough: yes- productive, clear Shortness of breath: yes Wheezing: yes Chest pain: no Chest tightness: yes Chest congestion: yes Nasal congestion: yes Runny nose: yes Post nasal drip: yes Sneezing: yes Sore throat: yes Swollen glands: no Sinus pressure: no Headache: yes Face pain: no Toothache: no Ear pain: no bilateral Ear pressure: no bilateral Eyes red/itching:no Eye drainage/crusting: no  Vomiting: no Rash: no Fatigue: yes Sick contacts: no Strep contacts: no  Context: fluctuating Recurrent sinusitis: no Relief with OTC cold/cough medications: no  Treatments attempted: nasal mist spray, drinking fluids, ibuprofen  ROS See pertinent positives and negatives per HPI.     Objective:    BP 126/84 (BP Location: Right Arm, Patient Position: Sitting, Cuff Size: Normal)   Pulse 79   Temp 98.2 F (36.8 C) (Temporal)   Wt 173 lb 3.2 oz (78.6 kg)   SpO2 98%   BMI 25.58 kg/m    Physical Exam Vitals and nursing note reviewed.  Constitutional:      Appearance: Normal appearance.  HENT:     Head: Normocephalic.     Right Ear: Tympanic membrane, ear canal and external ear normal.     Left Ear: Tympanic membrane, ear canal and external ear normal.     Nose:     Right Sinus: No maxillary sinus tenderness or frontal sinus tenderness.     Left Sinus: No maxillary sinus tenderness or frontal sinus tenderness.  Eyes:      Conjunctiva/sclera: Conjunctivae normal.  Cardiovascular:     Rate and Rhythm: Normal rate and regular rhythm.     Pulses: Normal pulses.     Heart sounds: Normal heart sounds.  Pulmonary:     Effort: Pulmonary effort is normal.     Breath sounds: Normal breath sounds.  Musculoskeletal:     Cervical back: Normal range of motion and neck supple. No tenderness.  Lymphadenopathy:     Cervical: No cervical adenopathy.  Skin:    General: Skin is warm.  Neurological:     General: No focal deficit present.     Mental Status: He is alert and oriented to person, place, and time.  Psychiatric:        Mood and Affect: Mood normal.        Behavior: Behavior normal.        Thought Content: Thought content normal.        Judgment: Judgment normal.     Results for orders placed or performed in visit on 04/30/22  POCT Influenza A/B  Result Value Ref Range   Influenza A, POC Negative Negative   Influenza B, POC Negative Negative        Assessment & Plan:   Problem List Items Addressed This Visit   None Visit Diagnoses     Upper respiratory tract infection, unspecified type    -  Primary   Flu neg in office, home covid test  neg. Most likely viral. Will treat with prednisone taper and tussionex prn cough. PDMP reviewed. Encourage fluids, rest.   Relevant Orders   POCT Influenza A/B (Completed)       Meds ordered this encounter  Medications   predniSONE (DELTASONE) 10 MG tablet    Sig: Take 6 tablets today, then 5 tablets tomorrow, then decrease by 1 tablet every day until gone    Dispense:  21 tablet    Refill:  0   chlorpheniramine-HYDROcodone (TUSSIONEX) 10-8 MG/5ML    Sig: Take 5 mLs by mouth every 12 (twelve) hours as needed for cough.    Dispense:  70 mL    Refill:  0    Return if symptoms worsen or fail to improve.  Charyl Dancer, NP

## 2022-04-30 NOTE — Patient Instructions (Signed)
It was great to see you!  Start prednisone taper, 6 tablets today, 5 tomorrow, 4 the next day, and keep decreasing by 1 until gone. Take this in the morning with food.   Start tussionex twice a day as needed for cough.   Let's follow-up if your symptoms worsen or don't improve.   Take care,  Vance Peper, NP

## 2022-05-01 ENCOUNTER — Encounter: Payer: Self-pay | Admitting: Nurse Practitioner

## 2022-05-05 ENCOUNTER — Ambulatory Visit (HOSPITAL_COMMUNITY): Payer: BC Managed Care – PPO | Attending: Internal Medicine

## 2022-05-05 DIAGNOSIS — R072 Precordial pain: Secondary | ICD-10-CM | POA: Insufficient documentation

## 2022-05-05 LAB — MYOCARDIAL PERFUSION IMAGING
Angina Index: 0
Duke Treadmill Score: 10
Estimated workload: 11.7
Exercise duration (min): 10 min
Exercise duration (sec): 1 s
LV dias vol: 92 mL (ref 62–150)
LV sys vol: 36 mL
MPHR: 158 {beats}/min
Nuc Stress EF: 61 %
Peak HR: 150 {beats}/min
Percent HR: 94 %
Rest HR: 66 {beats}/min
Rest Nuclear Isotope Dose: 10.8 mCi
SDS: 0
SRS: 0
SSS: 0
ST Depression (mm): 0 mm
Stress Nuclear Isotope Dose: 30.6 mCi
TID: 0.86

## 2022-05-05 MED ORDER — TECHNETIUM TC 99M TETROFOSMIN IV KIT
30.6000 | PACK | Freq: Once | INTRAVENOUS | Status: AC | PRN
  Administered 2022-05-05: 30.6 via INTRAVENOUS

## 2022-05-05 MED ORDER — TECHNETIUM TC 99M TETROFOSMIN IV KIT
10.8000 | PACK | Freq: Once | INTRAVENOUS | Status: AC | PRN
  Administered 2022-05-05: 10.8 via INTRAVENOUS

## 2022-05-06 ENCOUNTER — Ambulatory Visit (HOSPITAL_COMMUNITY)
Admission: RE | Admit: 2022-05-06 | Discharge: 2022-05-06 | Disposition: A | Discharge status: Home / Self Care | Payer: BC Managed Care – PPO | Source: Ambulatory Visit | Attending: Internal Medicine | Admitting: Internal Medicine

## 2022-05-06 DIAGNOSIS — J4520 Mild intermittent asthma, uncomplicated: Secondary | ICD-10-CM | POA: Insufficient documentation

## 2022-05-06 LAB — PULMONARY FUNCTION TEST
DL/VA % pred: 89 %
DL/VA: 3.75 ml/min/mmHg/L
DLCO unc % pred: 89 %
DLCO unc: 23.71 ml/min/mmHg
FEF 25-75 Post: 3.58 L/sec
FEF 25-75 Pre: 3.34 L/sec
FEF2575-%Change-Post: 7 %
FEF2575-%Pred-Post: 128 %
FEF2575-%Pred-Pre: 120 %
FEV1-%Change-Post: 2 %
FEV1-%Pred-Post: 110 %
FEV1-%Pred-Pre: 106 %
FEV1-Post: 3.76 L
FEV1-Pre: 3.65 L
FEV1FVC-%Change-Post: 3 %
FEV1FVC-%Pred-Pre: 105 %
FEV6-%Change-Post: 1 %
FEV6-%Pred-Post: 106 %
FEV6-%Pred-Pre: 104 %
FEV6-Post: 4.58 L
FEV6-Pre: 4.53 L
FEV6FVC-%Change-Post: 1 %
FEV6FVC-%Pred-Post: 104 %
FEV6FVC-%Pred-Pre: 103 %
FVC-%Change-Post: 0 %
FVC-%Pred-Post: 101 %
FVC-%Pred-Pre: 101 %
FVC-Post: 4.6 L
FVC-Pre: 4.62 L
Post FEV1/FVC ratio: 82 %
Post FEV6/FVC ratio: 100 %
Pre FEV1/FVC ratio: 79 %
Pre FEV6/FVC Ratio: 98 %
RV % pred: 117 %
RV: 2.61 L
TLC % pred: 109 %
TLC: 7.44 L

## 2022-05-06 MED ORDER — ALBUTEROL SULFATE (2.5 MG/3ML) 0.083% IN NEBU
2.5000 mg | INHALATION_SOLUTION | Freq: Once | RESPIRATORY_TRACT | Status: AC
  Administered 2022-05-06: 2.5 mg via RESPIRATORY_TRACT

## 2022-05-12 ENCOUNTER — Ambulatory Visit
Admission: RE | Admit: 2022-05-12 | Discharge: 2022-05-12 | Disposition: A | Discharge status: Home / Self Care | Payer: BC Managed Care – PPO | Source: Ambulatory Visit | Attending: Surgery | Admitting: Surgery

## 2022-05-12 DIAGNOSIS — I700 Atherosclerosis of aorta: Secondary | ICD-10-CM | POA: Diagnosis not present

## 2022-05-12 DIAGNOSIS — D485 Neoplasm of uncertain behavior of skin: Secondary | ICD-10-CM | POA: Diagnosis not present

## 2022-05-12 DIAGNOSIS — N400 Enlarged prostate without lower urinary tract symptoms: Secondary | ICD-10-CM | POA: Diagnosis not present

## 2022-05-12 DIAGNOSIS — R1031 Right lower quadrant pain: Secondary | ICD-10-CM

## 2022-05-12 DIAGNOSIS — N281 Cyst of kidney, acquired: Secondary | ICD-10-CM | POA: Diagnosis not present

## 2022-05-12 DIAGNOSIS — L578 Other skin changes due to chronic exposure to nonionizing radiation: Secondary | ICD-10-CM | POA: Diagnosis not present

## 2022-05-12 DIAGNOSIS — L821 Other seborrheic keratosis: Secondary | ICD-10-CM | POA: Diagnosis not present

## 2022-05-12 DIAGNOSIS — M25551 Pain in right hip: Secondary | ICD-10-CM | POA: Diagnosis not present

## 2022-05-12 DIAGNOSIS — D1801 Hemangioma of skin and subcutaneous tissue: Secondary | ICD-10-CM | POA: Diagnosis not present

## 2022-05-12 DIAGNOSIS — L814 Other melanin hyperpigmentation: Secondary | ICD-10-CM | POA: Diagnosis not present

## 2022-05-12 MED ORDER — IOPAMIDOL (ISOVUE-300) INJECTION 61%
100.0000 mL | Freq: Once | INTRAVENOUS | Status: AC | PRN
  Administered 2022-05-12: 100 mL via INTRAVENOUS

## 2022-05-14 DIAGNOSIS — H26492 Other secondary cataract, left eye: Secondary | ICD-10-CM | POA: Diagnosis not present

## 2022-05-29 DIAGNOSIS — N509 Disorder of male genital organs, unspecified: Secondary | ICD-10-CM | POA: Diagnosis not present

## 2022-05-29 DIAGNOSIS — R102 Pelvic and perineal pain: Secondary | ICD-10-CM | POA: Diagnosis not present

## 2022-05-29 DIAGNOSIS — L304 Erythema intertrigo: Secondary | ICD-10-CM | POA: Diagnosis not present

## 2022-06-03 ENCOUNTER — Other Ambulatory Visit: Payer: Self-pay | Admitting: Internal Medicine

## 2022-06-03 DIAGNOSIS — E782 Mixed hyperlipidemia: Secondary | ICD-10-CM

## 2022-06-03 DIAGNOSIS — I25119 Atherosclerotic heart disease of native coronary artery with unspecified angina pectoris: Secondary | ICD-10-CM

## 2022-06-03 DIAGNOSIS — I2510 Atherosclerotic heart disease of native coronary artery without angina pectoris: Secondary | ICD-10-CM

## 2022-06-03 DIAGNOSIS — R1031 Right lower quadrant pain: Secondary | ICD-10-CM | POA: Diagnosis not present

## 2022-06-03 DIAGNOSIS — N451 Epididymitis: Secondary | ICD-10-CM | POA: Diagnosis not present

## 2022-06-11 DIAGNOSIS — H2513 Age-related nuclear cataract, bilateral: Secondary | ICD-10-CM | POA: Diagnosis not present

## 2022-06-17 DIAGNOSIS — N451 Epididymitis: Secondary | ICD-10-CM | POA: Diagnosis not present

## 2022-07-07 ENCOUNTER — Other Ambulatory Visit (HOSPITAL_COMMUNITY): Payer: Self-pay

## 2022-07-09 ENCOUNTER — Telehealth: Payer: Self-pay

## 2022-07-09 NOTE — Telephone Encounter (Signed)
Pharmacy Patient Advocate Encounter  Prior Authorization for Praluent '75MG'$ /ML has been approved.    KEY# [id number] Effective dates: 07/07/22 through 07/06/23

## 2022-07-30 ENCOUNTER — Ambulatory Visit (INDEPENDENT_AMBULATORY_CARE_PROVIDER_SITE_OTHER): Payer: BC Managed Care – PPO | Admitting: Internal Medicine

## 2022-07-30 ENCOUNTER — Encounter: Payer: Self-pay | Admitting: Internal Medicine

## 2022-07-30 VITALS — BP 120/80 | HR 76 | Temp 97.7°F | Wt 174.7 lb

## 2022-07-30 DIAGNOSIS — M545 Low back pain, unspecified: Secondary | ICD-10-CM

## 2022-07-30 DIAGNOSIS — M546 Pain in thoracic spine: Secondary | ICD-10-CM | POA: Diagnosis not present

## 2022-07-30 LAB — POC URINALSYSI DIPSTICK (AUTOMATED)
Bilirubin, UA: NEGATIVE
Blood, UA: NEGATIVE
Glucose, UA: NEGATIVE
Ketones, UA: NEGATIVE
Leukocytes, UA: NEGATIVE
Nitrite, UA: NEGATIVE
Protein, UA: NEGATIVE
Spec Grav, UA: 1.01 (ref 1.010–1.025)
Urobilinogen, UA: 0.2 E.U./dL
pH, UA: 6 (ref 5.0–8.0)

## 2022-07-30 NOTE — Progress Notes (Signed)
Established Patient Office Visit     CC/Reason for Visit: "I think I have a UTI"  HPI: Jake [name] is a 62 y.o. male who is coming in today for the above mentioned reasons.  For a couple weeks he has been having low-grade right thoracic back pain.  He has been more physically active lately.  He had a CT scan in December that showed a small right inguinal hernia.  He has not had any dysuria, hematuria but is concerned that he might have a UTI.   Past Medical/Surgical History: Past Medical History:  Diagnosis Date   Allergy    Arthritis    Asthma    CAD in native artery    a. nonobstructive by cardiac CTA.   Cataract    Coronary artery disease    GERD (gastroesophageal reflux disease)    Hyperlipidemia    Neuromuscular disorder (HCC)    carpal tunnel bilateral wrists   Pulmonary nodules    Sinus bradycardia     Past Surgical History:  Procedure Laterality Date   COLONOSCOPY     INGUINAL HERNIA REPAIR  2009   LUMBAR LAMINECTOMY  1997   NECK SURGERY  01/2010   c5-7   VASECTOMY      Social History:  reports that he quit smoking about 30 years ago. His smoking use included cigarettes. He has a 10.00 pack-year smoking history. He has never used smokeless tobacco. He reports current alcohol use of about 10.0 standard drinks of alcohol per week. He reports that he does not use drugs.  Allergies: Allergies  Allergen Reactions   Latex     REACTION: RASH   Lipitor [Atorvastatin]     Muscle aches at 20 mg     Other Rash    Wool pants caused rash    Zocor [Simvastatin]     Muscle aches at 20 mg daily   Crestor [Rosuvastatin Calcium]     Muscle aches   Gluten Meal     Digestive symptoms    Family History:  Family History  Problem Relation Age of Onset   Cancer Mother        "blood cancer"   Osteoporosis Mother    Heart disease Father    Parkinson's disease Father    Cancer Father 84       glioblastoma   Schizophrenia Brother    Heart disease Brother     Alcohol abuse Brother    Colon cancer Paternal Grandmother 99   Alcohol abuse Other    Asthma Other    Depression Other    Valvular heart disease Other    Esophageal cancer Neg Hx    Stomach cancer Neg Hx    Rectal cancer Neg Hx      Current Outpatient Medications:    albuterol (VENTOLIN HFA) 108 (90 Base) MCG/ACT inhaler, INHALE 2 PUFFS EVERY 6 HOURS AS NEEDED FOR COUGH, WHEEZE, SHORTNESS OF BREATH., Disp: 8.5 g, Rfl: 2   diclofenac Sodium (VOLTAREN) 1 % GEL, APPLY 2 GRAMS TO THE AFFECTED AREA(S) BY TOPICAL ROUTE 4 TIMES PER DAY, Disp: , Rfl:    EPINEPHrine 0.3 mg/0.3 mL IJ SOAJ injection, Inject 0.3 mg into the muscle as needed for anaphylaxis., Disp: 2 each, Rfl: 2   ezetimibe (ZETIA) 10 MG tablet, TAKE ONE TABLET BY MOUTH ONCE DAILY., Disp: 90 tablet, Rfl: 3   fluticasone (FLONASE) 50 MCG/ACT nasal spray, Place 2 sprays into both nostrils as needed for allergies or rhinitis., Disp: ,  Rfl:    Ibuprofen (ADVIL PO), Take by mouth as needed., Disp: , Rfl:    Loperamide HCl (IMODIUM PO), Take by mouth as needed., Disp: , Rfl:    Multiple Vitamins-Minerals (MULTIVITAMIN ADULT PO), daily. , Disp: , Rfl:    omeprazole (PRILOSEC) 40 MG capsule, Take one capsule shortly before breakfast each day., Disp: 30 capsule, Rfl: 9   OTEZLA 30 MG TABS, Take 1 tablet by mouth 2 (two) times daily., Disp: , Rfl:    PRALUENT 75 MG/ML SOAJ, INJECT 1 PEN INTO THE SKIN EVERY 2 WEEKS, Disp: 2 mL, Rfl: 11   sildenafil (VIAGRA) 50 MG tablet, Take 1 tablet (50 mg total) by mouth daily as needed for erectile dysfunction., Disp: 6 tablet, Rfl: 11  Review of Systems:  Negative unless indicated in HPI.   Physical Exam: Vitals:   07/30/22 0710  BP: 120/80  Pulse: 76  Temp: 97.7 F (36.5 C)  TempSrc: Oral  SpO2: 98%  Weight: 174 lb 11.2 oz (79.2 kg)    Body mass index is 25.8 kg/m.   Physical Exam Vitals reviewed.  Constitutional:      Appearance: Normal appearance.  HENT:     Head:  Normocephalic and atraumatic.  Eyes:     Conjunctiva/sclera: Conjunctivae normal.  Skin:    General: Skin is warm and dry.  Neurological:     General: No focal deficit present.     Mental Status: He is alert and oriented to person, place, and time.  Psychiatric:        Mood and Affect: Mood normal.        Behavior: Behavior normal.        Thought Content: Thought content normal.        Judgment: Judgment normal.      Impression and Plan:  Low back pain, unspecified back pain laterality, unspecified chronicity, unspecified whether sciatica present - Plan: POCT Urinalysis Dipstick (Automated)  Acute right-sided thoracic back pain  -In office urine dipstick is negative. -Suspect this is likely simply musculoskeletal back pain.  Have advised back stretches, as needed NSAIDs.  He will follow-up with [place] if symptoms do not resolve.  Time spent:23 minutes reviewing chart, interviewing and examining patient and formulating plan of care.     Lelon Frohlich, MD [facility]

## 2022-08-01 ENCOUNTER — Other Ambulatory Visit: Payer: Self-pay | Admitting: Internal Medicine

## 2022-08-12 DIAGNOSIS — N5082 Scrotal pain: Secondary | ICD-10-CM | POA: Diagnosis not present

## 2022-08-17 DIAGNOSIS — G5603 Carpal tunnel syndrome, bilateral upper limbs: Secondary | ICD-10-CM | POA: Diagnosis not present

## 2022-08-17 DIAGNOSIS — M65331 Trigger finger, right middle finger: Secondary | ICD-10-CM | POA: Diagnosis not present

## 2022-08-17 DIAGNOSIS — M13841 Other specified arthritis, right hand: Secondary | ICD-10-CM | POA: Diagnosis not present

## 2022-09-29 DIAGNOSIS — L570 Actinic keratosis: Secondary | ICD-10-CM | POA: Diagnosis not present

## 2022-09-29 DIAGNOSIS — L304 Erythema intertrigo: Secondary | ICD-10-CM | POA: Diagnosis not present

## 2022-09-29 DIAGNOSIS — D485 Neoplasm of uncertain behavior of skin: Secondary | ICD-10-CM | POA: Diagnosis not present

## 2022-10-14 DIAGNOSIS — L4059 Other psoriatic arthropathy: Secondary | ICD-10-CM | POA: Diagnosis not present

## 2022-10-14 DIAGNOSIS — R21 Rash and other nonspecific skin eruption: Secondary | ICD-10-CM | POA: Diagnosis not present

## 2022-10-14 DIAGNOSIS — M1991 Primary osteoarthritis, unspecified site: Secondary | ICD-10-CM | POA: Diagnosis not present

## 2022-10-14 DIAGNOSIS — M722 Plantar fascial fibromatosis: Secondary | ICD-10-CM | POA: Diagnosis not present

## 2023-01-07 ENCOUNTER — Telehealth: Payer: Self-pay | Admitting: Pharmacy Technician

## 2023-01-07 ENCOUNTER — Other Ambulatory Visit (HOSPITAL_COMMUNITY): Payer: Self-pay

## 2023-01-07 NOTE — Telephone Encounter (Signed)
Pharmacy Patient Advocate Encounter   Received notification from CoverMyMeds that prior authorization for Praluent 75MG /ML auto-injectors is required/requested.   Insurance verification completed.   The patient is insured through [place] .   Per test claim: PA required; PA started via CoverMyMeds. KEY [id number] . Waiting for clinical questions to populate.

## 2023-01-07 NOTE — Telephone Encounter (Signed)
Pharmacy Patient Advocate Encounter   Received notification from CoverMyMeds that prior authorization for Praluent 75MG /ML auto-injectors is required/requested.   Insurance verification completed.   The patient is insured through [facility] .   Per test claim: PA required; [place] submitted to [employer] via CoverMyMeds Key/confirmation #/EOC [facility]   Status is pending

## 2023-01-08 ENCOUNTER — Other Ambulatory Visit (HOSPITAL_COMMUNITY): Payer: Self-pay

## 2023-01-08 NOTE — Telephone Encounter (Signed)
Pharmacy Patient Advocate Encounter  Received notification from [facility] that Prior Authorization for Praluent 75MG /ML auto-injectors has been APPROVED from 07/07/2022 to 07/06/2023   PA #/Case ID/Reference #: [id number]

## 2023-01-13 ENCOUNTER — Ambulatory Visit (INDEPENDENT_AMBULATORY_CARE_PROVIDER_SITE_OTHER): Payer: BC Managed Care – PPO | Admitting: Family Medicine

## 2023-01-13 ENCOUNTER — Encounter: Payer: Self-pay | Admitting: Family Medicine

## 2023-01-13 VITALS — BP 132/60 | HR 85 | Temp 98.0°F | Ht 69.0 in | Wt 181.3 lb

## 2023-01-13 DIAGNOSIS — H9201 Otalgia, right ear: Secondary | ICD-10-CM

## 2023-01-13 MED ORDER — CIPRO HC 0.2-1 % OT SUSP: 3.0000 [drp] | Freq: Two times a day (BID) | OTIC | 0 refills | Status: DC

## 2023-01-13 NOTE — Progress Notes (Unsigned)
Established Patient Office Visit  Subjective   Patient ID: Jake [name], male    DOB: 1960/11/03  Age: 62 y.o. MRN: [id number]  Chief Complaint  Patient presents with   Ear Pain    Patient complains of Bilateral ear pain, x2 days     HPI  {History (Optional):23778} [name] is seen with concerns for possible right ear infection.  He states Monday he was at his sister-in-law's pool and pool perhaps had not been cleaned well.  There is some cloudiness and haziness in the water.  After swimming he noticed some right ear pain especially yesterday.  No drainage.  Has had otitis externa in the past.  Currently no left ear symptoms.  No fever.  No recent sinus congestion.  Past Medical History:  Diagnosis Date   Allergy    Arthritis    Asthma    CAD in native artery    a. nonobstructive by cardiac CTA.   Cataract    Coronary artery disease    GERD (gastroesophageal reflux disease)    Hyperlipidemia    Neuromuscular disorder (HCC)    carpal tunnel bilateral wrists   Pulmonary nodules    Sinus bradycardia    Past Surgical History:  Procedure Laterality Date   COLONOSCOPY     INGUINAL HERNIA REPAIR  2009   LUMBAR LAMINECTOMY  1997   NECK SURGERY  01/2010   c5-7   VASECTOMY      reports that he quit smoking about 30 years ago. His smoking use included cigarettes. He started smoking about 50 years ago. He has a 10 pack-year smoking history. He has never used smokeless tobacco. He reports current alcohol use of about 10.0 standard drinks of alcohol per week. He reports that he does not use drugs. family history includes Alcohol abuse in his brother and another family member; Asthma in an other family member; Cancer in his mother; Cancer (age of onset: 39) in his father; Colon cancer (age of onset: 56) in his paternal grandmother; Depression in an other family member; Heart disease in his brother and father; Osteoporosis in his mother; Parkinson's disease in his father; Schizophrenia in  his brother; Valvular heart disease in an other family member. Allergies  Allergen Reactions   Latex     REACTION: RASH   Lipitor [Atorvastatin]     Muscle aches at 20 mg     Other Rash    Wool pants caused rash    Zocor [Simvastatin]     Muscle aches at 20 mg daily   Crestor [Rosuvastatin Calcium]     Muscle aches   Gluten Meal     Digestive symptoms    Review of Systems  Constitutional:  Negative for chills and fever.  HENT:  Positive for ear pain. Negative for congestion, ear discharge, hearing loss and tinnitus.       Objective:     BP 132/60 (BP Location: Left Arm, Patient Position: Sitting, Cuff Size: Normal)   Pulse 85   Temp 98 F (36.7 C) (Oral)   Ht 5\' 9"  (1.753 m)   Wt 181 lb 4.8 oz (82.2 kg)   SpO2 98%   BMI 26.77 kg/m  {Vitals History (Optional):23777}  Physical Exam Vitals reviewed.  Constitutional:      Appearance: Normal appearance.  HENT:     Ears:     Comments: Both the right and left external canals appear normal.  No erythema.  No visible edema.  No exudate.  Eardrums show no  acute changes Neurological:     Mental Status: He is alert.      No results found for any visits on 01/13/23.  {Labs (Optional):23779}  The ASCVD Risk score (Arnett DK, et al., 2019) failed to calculate for the following reasons:   The valid total cholesterol range is 130 to 320 mg/dL    Assessment & Plan:   Right otalgia with no evidence for acute otitis media or otitis externa at this time.  He has been prone to swimmer's ear frequently in the past.  Keep ear dry.  We did write for Cipro HC drops to use 3 drops right ear twice daily if symptoms worsen   Evelena Peat, MD

## 2023-01-27 DIAGNOSIS — M79671 Pain in right foot: Secondary | ICD-10-CM | POA: Diagnosis not present

## 2023-01-27 DIAGNOSIS — M7662 Achilles tendinitis, left leg: Secondary | ICD-10-CM | POA: Diagnosis not present

## 2023-01-27 DIAGNOSIS — M898X7 Other specified disorders of bone, ankle and foot: Secondary | ICD-10-CM | POA: Diagnosis not present

## 2023-01-27 DIAGNOSIS — M6702 Short Achilles tendon (acquired), left ankle: Secondary | ICD-10-CM | POA: Diagnosis not present

## 2023-01-28 DIAGNOSIS — G5603 Carpal tunnel syndrome, bilateral upper limbs: Secondary | ICD-10-CM | POA: Diagnosis not present

## 2023-01-28 DIAGNOSIS — M65841 Other synovitis and tenosynovitis, right hand: Secondary | ICD-10-CM | POA: Diagnosis not present

## 2023-01-28 DIAGNOSIS — M79644 Pain in right finger(s): Secondary | ICD-10-CM | POA: Diagnosis not present

## 2023-02-06 DIAGNOSIS — Z23 Encounter for immunization: Secondary | ICD-10-CM | POA: Diagnosis not present

## 2023-02-06 DIAGNOSIS — S61011A Laceration without foreign body of right thumb without damage to nail, initial encounter: Secondary | ICD-10-CM | POA: Diagnosis not present

## 2023-03-18 DIAGNOSIS — H903 Sensorineural hearing loss, bilateral: Secondary | ICD-10-CM | POA: Diagnosis not present

## 2023-03-18 DIAGNOSIS — H9193 Unspecified hearing loss, bilateral: Secondary | ICD-10-CM | POA: Diagnosis not present

## 2023-03-18 DIAGNOSIS — Z0110 Encounter for examination of ears and hearing without abnormal findings: Secondary | ICD-10-CM | POA: Diagnosis not present

## 2023-03-18 DIAGNOSIS — H9313 Tinnitus, bilateral: Secondary | ICD-10-CM | POA: Diagnosis not present

## 2023-03-18 DIAGNOSIS — G253 Myoclonus: Secondary | ICD-10-CM | POA: Diagnosis not present

## 2023-03-23 ENCOUNTER — Telehealth: Payer: Self-pay | Admitting: Family Medicine

## 2023-03-23 MED ORDER — CIPROFLOXACIN-DEXAMETHASONE 0.3-0.1 % OT SUSP: 3.0000 [drp] | Freq: Two times a day (BID) | OTIC | 0 refills | Status: DC

## 2023-03-23 NOTE — Telephone Encounter (Signed)
Jake [name] with [facility] is calling and ciprofloxacin-hydrocortisone (CIPRO HC) OTIC suspension  is not covered however generic ciprodex is covered . The rx is from 01-13-2023

## 2023-03-23 NOTE — Telephone Encounter (Signed)
Rx sent 

## 2023-04-05 DIAGNOSIS — L821 Other seborrheic keratosis: Secondary | ICD-10-CM | POA: Diagnosis not present

## 2023-04-05 DIAGNOSIS — D229 Melanocytic nevi, unspecified: Secondary | ICD-10-CM | POA: Diagnosis not present

## 2023-04-05 DIAGNOSIS — L814 Other melanin hyperpigmentation: Secondary | ICD-10-CM | POA: Diagnosis not present

## 2023-04-05 DIAGNOSIS — L578 Other skin changes due to chronic exposure to nonionizing radiation: Secondary | ICD-10-CM | POA: Diagnosis not present

## 2023-04-14 DIAGNOSIS — M722 Plantar fascial fibromatosis: Secondary | ICD-10-CM | POA: Diagnosis not present

## 2023-04-14 DIAGNOSIS — M1991 Primary osteoarthritis, unspecified site: Secondary | ICD-10-CM | POA: Diagnosis not present

## 2023-04-14 DIAGNOSIS — R21 Rash and other nonspecific skin eruption: Secondary | ICD-10-CM | POA: Diagnosis not present

## 2023-04-14 DIAGNOSIS — L4059 Other psoriatic arthropathy: Secondary | ICD-10-CM | POA: Diagnosis not present

## 2023-04-16 ENCOUNTER — Other Ambulatory Visit: Payer: Self-pay | Admitting: Family Medicine

## 2023-04-19 DIAGNOSIS — Z111 Encounter for screening for respiratory tuberculosis: Secondary | ICD-10-CM | POA: Diagnosis not present

## 2023-04-22 ENCOUNTER — Ambulatory Visit: Payer: BC Managed Care – PPO | Attending: Internal Medicine | Admitting: Internal Medicine

## 2023-04-22 ENCOUNTER — Encounter: Payer: Self-pay | Admitting: Internal Medicine

## 2023-04-22 VITALS — BP 130/76 | HR 76 | Ht 69.0 in | Wt 183.0 lb

## 2023-04-22 DIAGNOSIS — I2510 Atherosclerotic heart disease of native coronary artery without angina pectoris: Secondary | ICD-10-CM

## 2023-04-22 DIAGNOSIS — L409 Psoriasis, unspecified: Secondary | ICD-10-CM

## 2023-04-22 DIAGNOSIS — R072 Precordial pain: Secondary | ICD-10-CM | POA: Diagnosis not present

## 2023-04-22 DIAGNOSIS — E782 Mixed hyperlipidemia: Secondary | ICD-10-CM | POA: Diagnosis not present

## 2023-04-22 NOTE — Patient Instructions (Addendum)
Medication Instructions:  Your physician has recommended you make the following change in your medication:  1-STOP Zetia  *If you need a refill on your cardiac medications before your next appointment, please call your pharmacy*  Lab Work: Your physician recommends that you return for lab work in: 2 months for fasting lipid panel and ALT.  If you have labs (blood work) drawn today and your tests are completely normal, you will receive your results only by: MyChart Message (if you have MyChart) OR A paper copy in the mail If you have any lab test that is abnormal or we need to change your treatment, we will call you to review the results.  Testing/Procedures: None today.  Follow-Up: At [facility], you and your health needs are our priority.  As part of our continuing mission to provide you with exceptional heart care, we have created designated Provider Care Teams.  These Care Teams include your primary Cardiologist (physician) and Advanced Practice Providers (APPs -  Physician Assistants and Nurse Practitioners) who all work together to provide you with the care you need, when you need it.  We recommend signing up for the patient portal called "MyChart".  Sign up information is provided on this After Visit Summary.  MyChart is used to connect with patients for Virtual Visits (Telemedicine).  Patients are able to view lab/test results, encounter notes, upcoming appointments, etc.  Non-urgent messages can be sent to your provider as well.   To learn more about what you can do with MyChart, go to [email or web address].    Your next appointment:   1 year(s)  Provider:   Christell Constant, MD

## 2023-04-22 NOTE — Progress Notes (Signed)
Cardiology Office Note:    Date:  04/22/2023   ID:  Jake [name], DOB 1960/11/15, MRN [id number]  PCP:  Kristian Covey, MD   [facility] Providers Cardiologist:  Christell Constant, MD     Referring MD: Kristian Covey, MD   CC: CAD f/u  History of Present Illness:    Jake [name] is a 62 y.o. male with a hx of Non obstructive CAD; HLD on Praluent, Asthma who presents for evaluation 04/11/21. 2023: Achilles tendon PT; back on Praulent 2024: able to get back to running.  Discussed the use of AI scribe software for clinical note transcription with the patient, who gave verbal consent to proceed.  Jake [name], a 62 year old individual with a history of non-obstructive coronary artery disease, hyperlipidemia managed with Praluent, and asthma, presented with concerns related to post-COVID-19 symptoms and non-cardiac chest pain. The patient reported feeling "not quite himself" and experienced prolonged issues following a COVID-19 infection. He also reported occasional chest discomfort, which was not associated with any cardiac or pulmonary issues based on previous tests.  However, he reported feeling "hiccupy" in his chest and a general sense of fatigue. Despite these symptoms, the patient remained active, engaging in weight training several times a week and maintaining a beekeeping operation.  In addition to his cardiovascular concerns, the patient was also managing psoriasis with [place], but due to side effects, he was transitioning to [employer]. The patient had a partial Achilles tendon rupture in 2022, attributed to a sudden movement while running on soft sand.  The patient's LDL levels were well-controlled with Praluent, and he was considering discontinuing Zetia. The patient's asthma was well-controlled, and pulmonary function tests were normal. Despite these well-managed conditions, the patient reported not feeling right since his COVID-19 infection, which has impacted  his cardiovascular health and overall well-being.   Past Medical History:  Diagnosis Date   Allergy    Arthritis    Asthma    CAD in native artery    a. nonobstructive by cardiac CTA.   Cataract    Coronary artery disease    GERD (gastroesophageal reflux disease)    Hyperlipidemia    Neuromuscular disorder (HCC)    carpal tunnel bilateral wrists   Pulmonary nodules    Sinus bradycardia     Past Surgical History:  Procedure Laterality Date   COLONOSCOPY     INGUINAL HERNIA REPAIR  2009   LUMBAR LAMINECTOMY  1997   NECK SURGERY  01/2010   c5-7   VASECTOMY      Current Medications: Current Meds  Medication Sig   albuterol (VENTOLIN HFA) 108 (90 Base) MCG/ACT inhaler INHALE 2 PUFFS EVERY 6 HOURS AS NEEDED FOR COUGH, WHEEZE, SHORTNESS OF BREATH.   diclofenac Sodium (VOLTAREN) 1 % GEL APPLY 2 GRAMS TO THE AFFECTED AREA(S) BY TOPICAL ROUTE 4 TIMES PER DAY   EPINEPHrine 0.3 mg/0.3 mL IJ SOAJ injection Inject 0.3 mg into the muscle as needed for anaphylaxis.   fluticasone (FLONASE) 50 MCG/ACT nasal spray Place 2 sprays into both nostrils as needed for allergies or rhinitis.   Ibuprofen (ADVIL PO) Take by mouth as needed.   Loperamide HCl (IMODIUM PO) Take by mouth as needed.   Multiple Vitamins-Minerals (MULTIVITAMIN ADULT PO) daily.    omeprazole (PRILOSEC) 40 MG capsule Take one capsule shortly before breakfast each day.   OTEZLA 30 MG TABS Take 1 tablet by mouth 2 (two) times daily.   PRALUENT 75 MG/ML SOAJ INJECT 1 PEN INTO  THE SKIN EVERY 2 WEEKS   sildenafil (VIAGRA) 50 MG tablet Take 1 tablet (50 mg total) by mouth daily as needed for erectile dysfunction.   [DISCONTINUED] ciprofloxacin-dexamethasone (CIPRODEX) OTIC suspension Place 3 drops into both ears 2 (two) times daily.   [DISCONTINUED] ciprofloxacin-hydrocortisone (CIPRO HC) OTIC suspension Place 3 drops into both ears 2 (two) times daily.   [DISCONTINUED] ezetimibe (ZETIA) 10 MG tablet TAKE ONE TABLET BY MOUTH ONCE  DAILY.     Allergies:   Latex, Lipitor [atorvastatin], Other, Zocor [simvastatin], Crestor [rosuvastatin calcium], and Gluten meal   Social History   Socioeconomic History   Marital status: Married    Spouse name: Pollyann Glen   Number of children: 4   Years of education: BA   Highest education level: Not on file  Occupational History   Occupation: [employer]  Tobacco Use   Smoking status: Former    Current packs/day: 0.00    Average packs/day: 0.5 packs/day for 20.0 years (10.0 ttl pk-yrs)    Types: Cigarettes    Start date: 06/22/1972    Quit date: 06/22/1992    Years since quitting: 30.8   Smokeless tobacco: Never  Vaping Use   Vaping status: Never Used  Substance and Sexual Activity   Alcohol use: Yes    Alcohol/week: 10.0 standard drinks of alcohol    Types: 10 Standard drinks or equivalent per week   Drug use: No   Sexual activity: Not on file  Other Topics Concern   Not on file  Social History Narrative   Patient is married Pollyann Glen) and has four children.   Patient lives at home with his wife and children.   Patient is working  At [employer].   Patient has a BA degree.   Patient used both hands.   Patient drinks three cups of coffee daily.   Social Determinants of Health   Financial Resource Strain: Not on file  Food Insecurity: Low Risk  (03/18/2023)   Received from [facility]   Hunger Vital Sign    Worried About Running Out of Food in the Last Year: Never true    Ran Out of Food in the Last Year: Never true  Transportation Needs: No Transportation Needs (03/18/2023)   Received from [facility]    In the past 12 months, has lack of reliable transportation kept you from medical appointments, meetings, work or from getting things needed for daily living? : No  Physical Activity: Not on file  Stress: Not on file  Social Connections: Unknown (02/26/2022)   [facility]   Social Network     Social Network: Not on file    Social: had Achilles issues in 2022.  He is a Systems developer.   Family History: The patient's family history includes Alcohol abuse in his brother and another family member; Asthma in an other family member; Cancer in his mother; Cancer (age of onset: 79) in his father; Colon cancer (age of onset: 23) in his paternal grandmother; Depression in an other family member; Heart disease in his brother and father; Osteoporosis in his mother; Parkinson's disease in his father; Schizophrenia in his brother; Valvular heart disease in an other family member. There is no history of Esophageal cancer, Stomach cancer, or Rectal cancer.  ROS:   Please see the history of present illness.     EKGs/Labs/Other Studies Reviewed:    Cardiac Studies & Procedures     STRESS TESTS  MYOCARDIAL PERFUSION IMAGING 05/05/2022  Narrative   The study is normal. The study is low risk.   No ST deviation was noted.   Left ventricular function is normal. Nuclear stress EF: 61 %. The left ventricular ejection fraction is normal (55-65%). End diastolic cavity size is normal. End systolic cavity size is normal.   Prior study not available for comparison.  Normal stress test, low risk.      CT SCANS  CT CORONARY MORPH W/CTA COR W/SCORE 10/19/2017  Addendum 10/19/2017  3:22 PM ADDENDUM REPORT: 10/19/2017 15:20  CLINICAL DATA:  62 year old male with known non-obstructive CAD.  EXAM: Cardiac/Coronary  CT  TECHNIQUE: The patient was scanned on a [employer].  FINDINGS: A 120 kV prospective scan was triggered in the descending thoracic aorta at 111 HU's. Axial non-contrast 3 mm slices were carried out through the heart. The data set was analyzed on a dedicated work station and scored using the Agatson method. Gantry rotation speed was 250 msecs and collimation was .6 mm. No beta blockade and 0.8 mg of sl NTG was given. The 3D data set was reconstructed in 5% intervals of the  67-82 % of the R-R cycle. Diastolic phases were analyzed on a dedicated work station using MPR, MIP and VRT modes. The patient received 80 cc of contrast.  Aorta:  Normal size.  No calcifications.  No dissection.  Aortic Valve:  Trileaflet.  No calcifications.  Coronary Arteries:  Normal coronary origin.  Right dominance.  RCA is a large dominant artery that gives rise to PDA and PLVB. There is mild calcified plaque in the proximal RCA with associated stenosis 0-25%. Otherwise there ar luminal irregularities in the mid, distal RCA, PDA and PLA.  Left main is a large artery that gives rise to LAD, ramus intermedius and LCX arteries. LM has minimal non-calcified luminal irregularities.  LAD is a medium size vessel with a mild plague in its proximal and mid portion with associate stenosis 25-50% but possibly 50-69%. Mid to distal LAD has minimal non-calcified plaque with 0-25% stenosis.  D1 and D2 are small with no significant stenoses.  RI is moderate caliber vessel that further divides into two vessels and gives rise to a small septal branch. RI supplies a large territory of anterolateral wall. Ostial portion of the RI has mild to moderate mostly calcified plaque with associated stenosis 50-69%, sub-branches have only mild non-calcified plaque.  LCX is a non-dominant artery that gives rise to one small OM1 branch. There is no plaque.  Other findings:  Normal pulmonary vein drainage into the left atrium.  Normal let atrial appendage without a thrombus.  Normal size of the pulmonary artery.  IMPRESSION: 1. Coronary calcium score of 120. This was 3 percentile for age and sex matched control. In 2014 calcium score was 40 and represented 80 percentile for age/sex.  2. Normal coronary origin with right dominance.  3. When compared to the prior study from 2014 there is mild progression of CAD with mild worsening of the proximal LAD lesion and ostial ramus intermedius  lesion, still non-obstructive. We will obtain additional CT FFR analysis.   Electronically Signed By: Tobias Alexander On: 10/19/2017 15:20  Narrative EXAM: OVER-READ INTERPRETATION  CT CHEST  The following report is an over-read performed by radiologist Dr. Trudie Reed of [facility] on 10/19/2017. This over-read does not include interpretation of cardiac or coronary anatomy or pathology. The coronary calcium score/coronary CTA interpretation by the cardiologist is attached.  COMPARISON:  Chest CT 06/21/2015.  FINDINGS: A few scattered tiny pulmonary nodules are noted, largest of which is in the periphery of the right lower lobe (axial image 15 of series 13) measuring 4 mm. Within the visualized portions of the thorax there are no other larger suspicious appearing pulmonary nodules or masses, there is no acute consolidative airspace disease, no pleural effusions, no pneumothorax and no lymphadenopathy. Visualized portions of the upper abdomen are unremarkable. There are no aggressive appearing lytic or blastic lesions noted in the visualized portions of the skeleton.  IMPRESSION: 1. Multiple tiny pulmonary nodules measuring 4 mm or less in size, nonspecific but statistically likely benign. No follow-up needed if patient is low-risk (and has no known or suspected primary neoplasm). Non-contrast chest CT can be considered in 12 months if patient is high-risk. This recommendation follows the consensus statement: Guidelines for Management of Incidental Pulmonary Nodules Detected on CT Images: From the Fleischner Society 2017; Radiology 2017; 284:228-243.  Electronically Signed: By: Trudie Reed M.D. On: 10/19/2017 09:24   CT SCANS  CT CORONARY MORPH W/CTA COR W/SCORE 04/26/2013  Addendum 04/26/2013  6:53 PM ADDENDUM REPORT: 04/26/2013 18:51  CLINICAL DATA:  Chest pain  EXAM: Coronary Calcium Score, Coronary CTA  TECHNIQUE: The patient was  scanned on a Philips 256 scanner. Axial non-contrast 3mm slices were carried out through the heart. The data set was analyzed on a dedicated work station and scored using the Agatson method. A 120 kV retrospective scan was triggered in the descending thoracic aorta at 111 HU's. Gantry rotation speed was 270 msecs and collimation was .9 mm. 5 mg iv Metoprolol and 0.4 mg sl NTG was given. The 3D data set was reconstructed in 2-3% intervals of the diastole (73-83% of RR cycle). The patient received 80 cc of contrast.  FINDINGS: Non-cardiac: No significant non cardiac findings on limited lung and soft tissue windows. See separate report from [facility].  Coronary arteries:  Left main is a large vessel with minimal non-calcified plague with 0-25 % stenosis. Left main gives rise to LAD, ramus intermedium and LCx artery.  LAD is a large vessel with a mid plague in its ostial portion with associated 25-50% stenosis. There are 2 other calcified plagues in the proximal LAD with associated 25-50% stenosis. LAD only has 0-25% stenosis in its mid and distal segment. First diagonal vessel is rather small and only has mild luminal irregularities.  RI is moderate caliber vessel that further divides into two vessels and gives rise to the first septal branch. These two ramuses supply the entire anterolateral wall and have only mild luminal irregularities.  LCx is a small to moderate caliber non-dominant vessel that gives rise to one very small obtuse marginal branch. There is mild non-calcified plague in the proximal LCx artery associated with 0-25% stenosis.  RCA is a large dominant vessel that gives rise to PDA and PLVB. RCA has a mild calcified plague that is associated with 0-25% stenosis. The rest of RCA has only luminal irregularities. PDA is a large vessel that runs all the way to the apex. There are only luminal irregularities. PLVB is a small vessel without significant  stenosis.  Pericardium: Normal  IMPRESSION: 1. Coronary calcium score of 39.82. This was 42 percentile for age and sex matched control.  2.  Non-obstructive CAD.  Intense medical management is recommended.  Tobias Alexander   Electronically Signed By: Tobias Alexander On: 04/26/2013 18:51  Narrative EXAM: OVER-READ INTERPRETATION  CT CHEST  The following report  is an over-read performed by radiologist Dr. Francee Nodal [facility] on 04/26/2013. This over-read does not include interpretation of cardiac or coronary anatomy or pathology. The interpretation by the cardiologist is attached.  COMPARISON:  Chest radiographs 04/12/2013  FINDINGS: The visualized mediastinum appears normal. There are no pathologically enlarged mediastinal or hilar lymph nodes. There is mild distal esophageal wall thickening.  There is no pleural or pericardial effusion. There is a 3 mm noncalcified subpleural nodule in the right middle lobe on image number 24. Along the inferior aspect of the right major fissure is a 4 mm subpleural nodule on image 31. No pulmonary nodules are demonstrated on the left. The visualized upper abdomen appears unremarkable.  IMPRESSION: 1. Small subpleural nodules at the right lung base as described, typically benign. If the patient is at high risk for bronchogenic carcinoma, follow-up chest CT at 1year is recommended. If the patient is at low risk, no follow-up is needed. This recommendation follows the consensus statement: Guidelines for Management of Small Pulmonary Nodules Detected on CT Scans: A Statement from the Fleischner Society as published in Radiology 2005; 237:395-400. 2. Mild distal esophageal wall thickening, potentially related to reflux. 3. Cardiac/coronary findings are reported separately.  Electronically Signed: By: Roxy Horseman M.D. On: 04/26/2013 17:13           Recent Labs: No results found for requested labs within last  365 days.  Recent Lipid Panel    Component Value Date/Time   CHOL 114 04/14/2022 1109   CHOL 103 09/23/2020 0939   CHOL 101 (L) 12/24/2015 0750   CHOL 166 10/02/2014 0805   TRIG 109.0 04/14/2022 1109   TRIG 77 12/24/2015 0750   TRIG 97 10/02/2014 0805   HDL 73.10 04/14/2022 1109   HDL 63 09/23/2020 0939   HDL 66 12/24/2015 0750   HDL 50 10/02/2014 0805   CHOLHDL 2 04/14/2022 1109   VLDL 21.8 04/14/2022 1109   LDLCALC 19 04/14/2022 1109   LDLCALC 25 09/23/2020 0939   LDLCALC 20 12/24/2015 0750   LDLCALC 97 10/02/2014 0805   LDLDIRECT 135.0 05/08/2013 0754    Physical Exam:    VS:  BP 130/76   Pulse 76   Ht 5\' 9"  (1.753 m)   Wt 183 lb (83 kg)   SpO2 97%   BMI 27.02 kg/m     Wt Readings from Last 3 Encounters:  04/22/23 183 lb (83 kg)  01/13/23 181 lb 4.8 oz (82.2 kg)  07/30/22 174 lb 11.2 oz (79.2 kg)    Gen: no distress, well nourished Neck: No JVD Ears: No  Homero Fellers Sign Cardiac: No Rubs or Gallops, no murmur, regular rate, +2 radial pulses Respiratory: Clear to auscultation bilaterally, normal effort, normal  respiratory rate GI: Soft, nontender, non-distended  MS: No  edema;  moves all extremities Neuro:  At time of evaluation, alert and oriented to person/place/time/situation  Psych: Normal affect  ASSESSMENT:    1. Coronary artery disease involving native coronary artery of native heart without angina pectoris   2. Precordial chest pain   3. Mixed hyperlipidemia   4. Psoriasis     PLAN:    Non-obstructive Coronary Artery Disease - Asymptomatic non-obstructive coronary artery disease. 2023 stress test was unremarkable. Currently on Praluent with excellent LDL levels. Discussed increasing physical activity to improve cardiovascular health and potentially reduce medication burden. Explained that if symptoms such as angina occur, further evaluation including possible cardiac catheterization may be necessary. - Stop Zetia  - Order  fasting lipids and ALT in a  couple of months   Hyperlipidemia - Well-controlled hyperlipidemia on Praluent. LDL levels are excellent. Plan to stop Zetia and monitor lipid levels to ensure no significant rebound occurs. Emphasized continued adherence to Praluent. - Continue Praluent; rest as above  Asthma Mild intermittent asthma. Pulmonary function tests were normal. Asthma is well controlled. - Continue current PCP management  Psoriasis - Psoriasis with recent flare managed with a 12-day prednisone pack. Transitioning from [place] to [place] due to side effects. Discussed psoriasis as it relates to risk for CAD  First degree HB - no evidence of significant disease; he is very active; monitor  General Health Maintenance - Encouraged to increase physical activity to improve cardiovascular health and potentially reduce medication burden. Discussed participation in water aerobics and regular exercise as beneficial.  Follow-up - Follow-up in one year unless new issues occur.        Medication Adjustments/Labs and Tests Ordered: Current medicines are reviewed at length with the patient today.  Concerns regarding medicines are outlined above.  Orders Placed This Encounter  Procedures   ALT   Lipid panel   EKG 12-Lead    No orders of the defined types were placed in this encounter.    Patient Instructions  Medication Instructions:  Your physician has recommended you make the following change in your medication:  1-STOP Zetia  *If you need a refill on your cardiac medications before your next appointment, please call your pharmacy*  Lab Work: Your physician recommends that you return for lab work in: 2 months for fasting lipid panel and ALT.  If you have labs (blood work) drawn today and your tests are completely normal, you will receive your results only by: MyChart Message (if you have MyChart) OR A paper copy in the mail If you have any lab test that is abnormal or we need to change your  treatment, we will call you to review the results.  Testing/Procedures: None today.  Follow-Up: At [facility], you and your health needs are our priority.  As part of our continuing mission to provide you with exceptional heart care, we have created designated Provider Care Teams.  These Care Teams include your primary Cardiologist (physician) and Advanced Practice Providers (APPs -  Physician Assistants and Nurse Practitioners) who all work together to provide you with the care you need, when you need it.  We recommend signing up for the patient portal called "MyChart".  Sign up information is provided on this After Visit Summary.  MyChart is used to connect with patients for Virtual Visits (Telemedicine).  Patients are able to view lab/test results, encounter notes, upcoming appointments, etc.  Non-urgent messages can be sent to your provider as well.   To learn more about what you can do with MyChart, go to [email or web address].    Your next appointment:   1 year(s)  Provider:   Christell Constant, MD       Signed, Christell Constant, MD  04/22/2023 2:05 PM    [facility]

## 2023-05-16 ENCOUNTER — Other Ambulatory Visit: Payer: Self-pay | Admitting: Family Medicine

## 2023-06-16 ENCOUNTER — Other Ambulatory Visit: Payer: Self-pay | Admitting: Family Medicine

## 2023-06-22 DIAGNOSIS — D3131 Benign neoplasm of right choroid: Secondary | ICD-10-CM | POA: Diagnosis not present

## 2023-06-23 DIAGNOSIS — E782 Mixed hyperlipidemia: Secondary | ICD-10-CM | POA: Diagnosis not present

## 2023-06-23 DIAGNOSIS — R072 Precordial pain: Secondary | ICD-10-CM | POA: Diagnosis not present

## 2023-06-23 DIAGNOSIS — I2510 Atherosclerotic heart disease of native coronary artery without angina pectoris: Secondary | ICD-10-CM | POA: Diagnosis not present

## 2023-06-23 LAB — LIPID PANEL
Chol/HDL Ratio: 2.2 {ratio} (ref 0.0–5.0)
Cholesterol, Total: 141 mg/dL (ref 100–199)
HDL: 65 mg/dL (ref >39)
LDL Chol Calc (NIH): 64 mg/dL (ref 0–99)
Triglycerides: 57 mg/dL (ref 0–149)
VLDL Cholesterol Cal: 12 mg/dL (ref 5–40)

## 2023-06-23 LAB — ALT: ALT: 23 [IU]/L (ref 0–44)

## 2023-06-24 ENCOUNTER — Other Ambulatory Visit (HOSPITAL_COMMUNITY): Payer: Self-pay

## 2023-06-24 ENCOUNTER — Telehealth: Payer: Self-pay | Admitting: Internal Medicine

## 2023-06-24 ENCOUNTER — Encounter: Payer: Self-pay | Admitting: Internal Medicine

## 2023-06-24 DIAGNOSIS — E782 Mixed hyperlipidemia: Secondary | ICD-10-CM

## 2023-06-24 MED ORDER — EZETIMIBE 10 MG PO TABS
10.0000 mg | ORAL_TABLET | Freq: Every day | ORAL | 3 refills | Status: AC
  Filled 2023-06-24: qty 90, 90d supply, fill #0

## 2023-06-24 NOTE — Telephone Encounter (Signed)
Spoke with patient and he will start back taking his Zetia. Zetia sent to pharmacy per patient request.  He also states he would like call to discuss his ALT \

## 2023-06-24 NOTE — Telephone Encounter (Signed)
Pt c/o medication issue:  1. Name of Medication:    2. How are you currently taking this medication (dosage and times per day)?    3. Are you having a reaction (difficulty breathing--STAT)? no  4. What is your medication issue? Patient would like to be back on this mediatation. Please advise    And would like for the dr to review his labs. States that his ALT has went up 6 points. Please advise

## 2023-06-25 NOTE — Telephone Encounter (Signed)
Called pt in regards to liver function.  Pt reports had lab work with Dr. Mallie Mussel liver function was 17 and now it is 23. Advised pt 23 is well WNL nothing of concern at this time.  Pt reports will have repeat lab work in March with Dr. Mallie Mussel. No further concerns at this time.

## 2023-06-30 ENCOUNTER — Telehealth: Payer: Self-pay

## 2023-06-30 ENCOUNTER — Other Ambulatory Visit (HOSPITAL_COMMUNITY): Payer: Self-pay

## 2023-06-30 NOTE — Telephone Encounter (Signed)
Pharmacy Patient Advocate Encounter   Received notification from CoverMyMeds that prior authorization for Sildenafil Citrate 50MG  tabletsis required/requested.   Insurance verification completed.   The patient is insured through [facility] .   Per test claim: Refill too soon. PA is not needed at this time. Medication was filled 06/22/23. Next eligible fill date is 07/22/23.  Per test claim: Insurance covers 4 tablets per 30 days  Key: [id number] - Archived

## 2023-07-07 ENCOUNTER — Other Ambulatory Visit: Payer: Self-pay | Admitting: Internal Medicine

## 2023-07-07 DIAGNOSIS — I2510 Atherosclerotic heart disease of native coronary artery without angina pectoris: Secondary | ICD-10-CM

## 2023-07-07 DIAGNOSIS — I25119 Atherosclerotic heart disease of native coronary artery with unspecified angina pectoris: Secondary | ICD-10-CM

## 2023-07-07 DIAGNOSIS — E782 Mixed hyperlipidemia: Secondary | ICD-10-CM

## 2023-07-08 ENCOUNTER — Telehealth: Payer: Self-pay | Admitting: Pharmacy Technician

## 2023-07-08 ENCOUNTER — Other Ambulatory Visit (HOSPITAL_COMMUNITY): Payer: Self-pay

## 2023-07-08 NOTE — Telephone Encounter (Signed)
 Pharmacy Patient Advocate Encounter   Received notification from Fax that prior authorization for Praluent  75MG /ML auto-injectors is required/requested.   Insurance verification completed.   The patient is insured through [facility] .   Per test claim: PA required; PA submitted to above mentioned insurance via CoverMyMeds Key/confirmation #/EOC [facility] Status is pending   Other pa expired 07/06/2023  Lf 07/05/23 28ds

## 2023-07-09 NOTE — Telephone Encounter (Signed)
 Spoke with Carlon Chester from [place] and Praluent  PA approved and is valid through 07/07/2024

## 2023-07-12 NOTE — Telephone Encounter (Signed)
 Patient filled 07/05/23

## 2023-07-14 DIAGNOSIS — M1991 Primary osteoarthritis, unspecified site: Secondary | ICD-10-CM | POA: Diagnosis not present

## 2023-07-14 DIAGNOSIS — L4059 Other psoriatic arthropathy: Secondary | ICD-10-CM | POA: Diagnosis not present

## 2023-07-14 DIAGNOSIS — M722 Plantar fascial fibromatosis: Secondary | ICD-10-CM | POA: Diagnosis not present

## 2023-07-14 DIAGNOSIS — R21 Rash and other nonspecific skin eruption: Secondary | ICD-10-CM | POA: Diagnosis not present

## 2023-07-28 ENCOUNTER — Ambulatory Visit (INDEPENDENT_AMBULATORY_CARE_PROVIDER_SITE_OTHER): Payer: BC Managed Care – PPO | Admitting: Family Medicine

## 2023-07-28 VITALS — BP 122/70 | HR 87 | Temp 98.3°F | Ht 68.5 in | Wt 185.1 lb

## 2023-07-28 DIAGNOSIS — Z7952 Long term (current) use of systemic steroids: Secondary | ICD-10-CM

## 2023-07-28 DIAGNOSIS — Z0000 Encounter for general adult medical examination without abnormal findings: Secondary | ICD-10-CM | POA: Diagnosis not present

## 2023-07-28 DIAGNOSIS — Z23 Encounter for immunization: Secondary | ICD-10-CM | POA: Diagnosis not present

## 2023-07-28 MED ORDER — SILDENAFIL CITRATE 100 MG PO TABS: 50.0000 mg | ORAL_TABLET | Freq: Every day | ORAL | 11 refills | Status: DC | PRN

## 2023-07-28 MED ORDER — ALBUTEROL SULFATE HFA 108 (90 BASE) MCG/ACT IN AERS: INHALATION_SPRAY | RESPIRATORY_TRACT | 2 refills | Status: AC

## 2023-07-28 NOTE — Progress Notes (Signed)
 Established Patient Office Visit  Subjective   Patient ID: KYCE GING, male    DOB: 1960/06/09  Age: 63 y.o. MRN: [id number]  Chief Complaint  Patient presents with   Annual Exam    HPI   Jake [name] is seen for physical exam.  He has history of GERD, psoriasis, hyperlipidemia, CAD.  He is followed by rheumatologist and currently on Skyrizi.  He remains on Praluent and Zetia for hyperlipidemia.  Due for follow-up labs.  He has had some weight gain in the past year and knows he needs to lose a few pounds.  Requesting refills of sildenafil for as needed use and also albuterol.  Health maintenance reviewed:  Health Maintenance  Topic Date Due   Pneumococcal Vaccine 30-68 Years old (1 of 2 - PCV) Never done   HIV Screening  Never done   Zoster Vaccines- Shingrix (1 of 2) 04/02/1979   COVID-19 Vaccine (3 - Pfizer risk series) 10/02/2019   INFLUENZA VACCINE  12/31/2022   Colonoscopy  01/25/2024   DTaP/Tdap/Td (3 - Tdap) 01/29/2028   Hepatitis C Screening  Completed   HPV VACCINES  Aged Out   -He would like to consider pneumonia vaccine especially in view of his immunosuppressant therapy. -Also would like to get Shingrix.  He has had Zostavax previously. -Also inquires about possible DEXA scan.  He has been on prednisone use frequently in the past.  Family history reviewed-mother had some type of "blood cancer.  He does not think this was leukemia.  Father died of glioblastoma age 63.  His father also had history of heart disease and Parkinson's.  He had a brother that had alcohol abuse history and died of complications from that.  Other brother had schizophrenia   Social history-married with 4 sons.  Multiple hobbies.  Never smoked.  No regular alcohol use.  No grandchildren yet.  He enjoys bonsai especially but has many other hobbies including beekeeping  Past Medical History:  Diagnosis Date   Allergy    Arthritis    Asthma    CAD in native artery    a. nonobstructive by  cardiac CTA.   Cataract    Coronary artery disease    GERD (gastroesophageal reflux disease)    Hyperlipidemia    Neuromuscular disorder (HCC)    carpal tunnel bilateral wrists   Pulmonary nodules    Sinus bradycardia    Past Surgical History:  Procedure Laterality Date   COLONOSCOPY     INGUINAL HERNIA REPAIR  2009   LUMBAR LAMINECTOMY  1997   NECK SURGERY  01/2010   c5-7   VASECTOMY      reports that he quit smoking about 31 years ago. His smoking use included cigarettes. He started smoking about 51 years ago. He has a 10 pack-year smoking history. He has never used smokeless tobacco. He reports current alcohol use of about 10.0 standard drinks of alcohol per week. He reports that he does not use drugs. family history includes Alcohol abuse in his brother and another family member; Asthma in an other family member; Cancer in his mother; Cancer (age of onset: 71) in his father; Colon cancer (age of onset: 52) in his paternal grandmother; Depression in an other family member; Heart disease in his brother and father; Osteoporosis in his mother; Parkinson's disease in his father; Schizophrenia in his brother; Valvular heart disease in an other family member. Allergies  Allergen Reactions   Latex     REACTION: RASH   Lipitor [  Atorvastatin]     Muscle aches at 20 mg     Other Rash    Wool pants caused rash    Zocor [Simvastatin]     Muscle aches at 20 mg daily   Crestor [Rosuvastatin Calcium]     Muscle aches   Gluten Meal     Digestive symptoms      Review of Systems  Constitutional:  Negative for chills, fever, malaise/fatigue and weight loss.  HENT:  Negative for hearing loss.   Eyes:  Negative for blurred vision and double vision.  Respiratory:  Negative for cough and shortness of breath.   Cardiovascular:  Negative for chest pain, palpitations and leg swelling.  Gastrointestinal:  Negative for abdominal pain, blood in stool, constipation and diarrhea.  Genitourinary:   Negative for dysuria.  Skin:  Negative for rash.  Neurological:  Negative for dizziness, speech change, seizures, loss of consciousness and headaches.  Psychiatric/Behavioral:  Negative for depression.       Objective:     BP 122/70 (BP Location: Left Arm, Patient Position: Sitting, Cuff Size: Normal)   Pulse 87   Temp 98.3 F (36.8 C) (Oral)   Ht 5' 8.5" (1.74 m)   Wt 185 lb 1.6 oz (84 kg)   SpO2 96%   BMI 27.73 kg/m  BP Readings from Last 3 Encounters:  07/28/23 122/70  04/22/23 130/76  01/13/23 132/60   Wt Readings from Last 3 Encounters:  07/28/23 185 lb 1.6 oz (84 kg)  04/22/23 183 lb (83 kg)  01/13/23 181 lb 4.8 oz (82.2 kg)      Physical Exam Vitals reviewed.  Constitutional:      General: He is not in acute distress.    Appearance: He is well-developed.  HENT:     Head: Normocephalic and atraumatic.     Right Ear: External ear normal.     Left Ear: External ear normal.  Eyes:     Conjunctiva/sclera: Conjunctivae normal.     Pupils: Pupils are equal, round, and reactive to light.  Neck:     Thyroid: No thyromegaly.  Cardiovascular:     Rate and Rhythm: Normal rate and regular rhythm.     Heart sounds: Normal heart sounds. No murmur heard. Pulmonary:     Effort: No respiratory distress.     Breath sounds: No wheezing or rales.  Abdominal:     General: Bowel sounds are normal. There is no distension.     Palpations: Abdomen is soft.     Tenderness: There is no abdominal tenderness. There is no guarding or rebound.     Comments: Ventral hernia which is soft and nontender  Musculoskeletal:     Cervical back: Normal range of motion and neck supple.  Lymphadenopathy:     Cervical: No cervical adenopathy.  Skin:    Findings: No rash.  Neurological:     Mental Status: He is alert and oriented to person, place, and time.     Cranial Nerves: No cranial nerve deficit.      No results found for any visits on 07/28/23.    The 10-year ASCVD risk score  (Arnett DK, et al., 2019) is: 6.5%    Assessment & Plan:   Problem List Items Addressed This Visit   None Visit Diagnoses       On prednisone therapy    -  Primary   Relevant Orders   DG Bone Density     Physical exam  Relevant Orders   Basic metabolic panel   Lipid panel   CBC with Differential/Platelet   Hepatic function panel   PSA     63 year old male with chronic problems as above.  We discussed several health maintenance items today as follows  -Discussed importance of gradual weight loss -Discussed newer guidelines regarding pneumonia vaccination.  He would like to proceed with that.  Prevnar 20 given. -Patient requesting Shingrix vaccine.  No contraindications.  Shingrix given and booster in 2 to 6 months -We discussed baseline DEXA scan with his chronic prednisone use in the past and he will set up.  Order provided -Obtain follow-up screening labs as above -Recommend continued annual flu vaccine  No follow-ups on file.    Evelena Peat, MD

## 2023-07-28 NOTE — Addendum Note (Signed)
 Addended by: Christy Sartorius on: 07/28/2023 03:12 PM   Modules accepted: Orders

## 2023-07-29 LAB — LIPID PANEL
Cholesterol: 103 mg/dL (ref 0–200)
HDL: 65.8 mg/dL (ref >39.00)
LDL Cholesterol: 18 mg/dL (ref 0–99)
NonHDL: 37.67
Total CHOL/HDL Ratio: 2
Triglycerides: 97 mg/dL (ref 0.0–149.0)
VLDL: 19.4 mg/dL (ref 0.0–40.0)

## 2023-07-29 LAB — CBC WITH DIFFERENTIAL/PLATELET
Basophils Absolute: 0 10*3/uL (ref 0.0–0.1)
Basophils Relative: 0.3 % (ref 0.0–3.0)
Eosinophils Absolute: 0.3 10*3/uL (ref 0.0–0.7)
Eosinophils Relative: 2.5 % (ref 0.0–5.0)
HCT: 44.2 % (ref 39.0–52.0)
Hemoglobin: 15 g/dL (ref 13.0–17.0)
Lymphocytes Relative: 29 % (ref 12.0–46.0)
Lymphs Abs: 2.9 10*3/uL (ref 0.7–4.0)
MCHC: 33.9 g/dL (ref 30.0–36.0)
MCV: 93.1 fL (ref 78.0–100.0)
Monocytes Absolute: 0.7 10*3/uL (ref 0.1–1.0)
Monocytes Relative: 7.4 % (ref 3.0–12.0)
Neutro Abs: 6 10*3/uL (ref 1.4–7.7)
Neutrophils Relative %: 60.8 % (ref 43.0–77.0)
Platelets: 330 10*3/uL (ref 150.0–400.0)
RBC: 4.75 Mil/uL (ref 4.22–5.81)
RDW: 12.3 % (ref 11.5–15.5)
WBC: 9.9 10*3/uL (ref 4.0–10.5)

## 2023-07-29 LAB — HEPATIC FUNCTION PANEL
ALT: 15 U/L (ref 0–53)
AST: 17 U/L (ref 0–37)
Albumin: 4.6 g/dL (ref 3.5–5.2)
Alkaline Phosphatase: 62 U/L (ref 39–117)
Bilirubin, Direct: 0.1 mg/dL (ref 0.0–0.3)
Total Bilirubin: 0.5 mg/dL (ref 0.2–1.2)
Total Protein: 6.9 g/dL (ref 6.0–8.3)

## 2023-07-29 LAB — BASIC METABOLIC PANEL
BUN: 13 mg/dL (ref 6–23)
CO2: 32 meq/L (ref 19–32)
Calcium: 9.1 mg/dL (ref 8.4–10.5)
Chloride: 102 meq/L (ref 96–112)
Creatinine, Ser: 0.95 mg/dL (ref 0.40–1.50)
GFR: 85.3 mL/min (ref >60.00)
Glucose, Bld: 106 mg/dL — ABNORMAL HIGH (ref 70–99)
Potassium: 3.6 meq/L (ref 3.5–5.1)
Sodium: 143 meq/L (ref 135–145)

## 2023-07-29 LAB — PSA: PSA: 1.25 ng/mL (ref 0.10–4.00)

## 2023-08-02 ENCOUNTER — Telehealth: Payer: Self-pay

## 2023-08-02 NOTE — Telephone Encounter (Signed)
 Copied from CRM [id number]. Topic: Clinical - Medical Advice >> Aug 02, 2023  3:35 PM Adaysia C wrote: Reason for CRM: Patient wanted to inform the provider he ate before his previous lab work which is why his glucose was a little higher than the fasting glucose levels but he does not want the provider to be concerned.

## 2023-08-02 NOTE — Telephone Encounter (Signed)
 Copied from CRM [id number]. Topic: Clinical - Lab/Test Results >> Aug 02, 2023  3:29 PM Elizebeth Brooking wrote: Reason for CRM: Patient called in returning missed call regarding lab results, relayed lab results to patient , patient confirmed he had no other questions

## 2023-08-02 NOTE — Telephone Encounter (Signed)
 Please see result note

## 2023-08-02 NOTE — Telephone Encounter (Signed)
 Noted.  No problem.  Kristian Covey MD [facility]

## 2023-08-11 ENCOUNTER — Ambulatory Visit (INDEPENDENT_AMBULATORY_CARE_PROVIDER_SITE_OTHER)
Admission: RE | Admit: 2023-08-11 | Discharge: 2023-08-11 | Disposition: A | Discharge status: Home / Self Care | Payer: BC Managed Care – PPO | Source: Ambulatory Visit | Attending: Family Medicine | Admitting: Family Medicine

## 2023-08-11 DIAGNOSIS — Z7952 Long term (current) use of systemic steroids: Secondary | ICD-10-CM

## 2023-08-17 ENCOUNTER — Telehealth: Payer: Self-pay

## 2023-08-17 NOTE — Telephone Encounter (Signed)
 Copied from CRM [id number]. Topic: Clinical - Lab/Test Results >> Aug 17, 2023  1:18 PM Denese Killings wrote: Reason for CRM: Patient is requesting a callback from Dr. Caryl Never regarding imaging results.

## 2023-08-18 NOTE — Telephone Encounter (Signed)
 Please see result note

## 2023-09-03 DIAGNOSIS — E291 Testicular hypofunction: Secondary | ICD-10-CM | POA: Diagnosis not present

## 2023-09-09 ENCOUNTER — Ambulatory Visit: Payer: Self-pay

## 2023-09-09 NOTE — Telephone Encounter (Signed)
 Chief Complaint: tick bite Symptoms: tick bite Frequency: removed last night at 2200 Pertinent Negatives: Patient denies fever, headache, N/V, chills, night sweats, headache Disposition: [] ED /[] Urgent Care (no appt availability in office) / [] Appointment(In office/virtual)/ []  [place]/ [x] Home Care/ [] Refused Recommended Disposition /[]  Mobile Bus/ []  Follow-up with PCP Additional Notes: Pt reports tick bite to his scrotum. Pt states it was a deer tick and he removed it last night at 2200. Pt endorses "a little" redness and tenderness. Pt denies fever, chills, headache, N/V. Pt endorses some joint pain at baseline d/t arthritis. Pt removed the tick with tweezers and used alcohol on the site. RN gave home care and advised the pt monitor for symptoms. Pt verbalized understanding.    Reason for Disposition  Deer tick bite with no complications  Answer Assessment - Initial Assessment Questions 1. ATTACHED:  "Is the tick still on the skin?"  (e.g., yes, no, unsure)     "I think I got it, but I can't be sure. I am going to look at it with a magnifying glass. I put it in a tiny vial. It's been walking around in there." 2. ONSET - TICK STILL ATTACHED:  "How long do you think the tick has been on your skin?" (e.g., hours, days, unsure)  Note:  Is there a recent activity (camping, hiking) where the caller may have been exposed?     Unattached - was on his scrotum. It was on "for several hours", he removed it around 2200pm last night. Pt states it may have bit him on Monday when he was in [place] and had to lie on the ground in the pine needles. 3. ONSET - TICK NOT STILL ATTACHED: "If the tick has been removed, how long do you think the tick was attached before you removed it?" (e.g., 5 hours, 2 days). "When was this?"     Unattached  4. LOCATION: "Where is the tick bite located?" (e.g., arm, leg)     Scrotum  5. TYPE of TICK: "Is it a wood tick or a deer tick?" (e.g., deer  tick, wood tick; unsure)     Deer tick  6. SIZE of TICK: "How big is the tick?" (e.g., size of poppy seed, apple seed, watermelon seed; unsure) Note: Deer ticks can be the size of a poppy seed (nymph) or an apple seed (adult).       1 mm 7. ENGORGED: "Did the tick look flat or engorged (full, swollen)?" (e.g., flat, engorged; unsure)     "Pretty flat" 8. OTHER SYMPTOMS: "Do you have any other symptoms?" (e.g., fever, rash, redness at bite area, red ring around bite)     "A little redness and swelling", denies spreading redness. "A little sensitive", "The point of contact I can see it looks like an injection site almost." "When it was removed I got a hold of 70% alcohol and applied it, there was a little bleeding going on." Denies fever or chills. "I've had a few nosebleeds lately and I had one this AM", "I have been having nosebleeds for 2 weeks - 3 in 2 wks." Denies headache. Endorses joint pain and stiffness "I have that already from arthritis."  Protocols used: Tick Bite-A-AH

## 2023-09-09 NOTE — Telephone Encounter (Signed)
 This RN made the first attempt to contact the patient. Patient did not answer, RN LVM with a callback number.  Copied From CRM [id number]. Reason for Triage: TICK BITE IN THE GROWING AREA, REQUESTING DR Caryl Never SEND HIM SOME MEDS IN

## 2023-09-23 DIAGNOSIS — M13841 Other specified arthritis, right hand: Secondary | ICD-10-CM | POA: Diagnosis not present

## 2023-09-23 DIAGNOSIS — M65841 Other synovitis and tenosynovitis, right hand: Secondary | ICD-10-CM | POA: Diagnosis not present

## 2023-09-23 DIAGNOSIS — G5603 Carpal tunnel syndrome, bilateral upper limbs: Secondary | ICD-10-CM | POA: Diagnosis not present

## 2023-10-06 ENCOUNTER — Telehealth: Payer: Self-pay | Admitting: Pharmacy Technician

## 2023-10-06 ENCOUNTER — Other Ambulatory Visit (HOSPITAL_COMMUNITY): Payer: Self-pay

## 2023-10-06 NOTE — Telephone Encounter (Signed)
 Pharmacy Patient Advocate Encounter   Received notification from CoverMyMeds that prior authorization for praluent  is required/requested.   Insurance verification completed.   The patient is insured through [facility] .   Per test claim: PA required; PA submitted to above mentioned insurance via CoverMyMeds Key/confirmation #/EOC [id number] Status is pending   Plan limits rejects only goes 2ml for 27 ds or 6ml for 82 ds right now

## 2023-10-07 ENCOUNTER — Encounter: Payer: Self-pay | Admitting: Pharmacy Technician

## 2023-10-07 ENCOUNTER — Other Ambulatory Visit (HOSPITAL_COMMUNITY): Payer: Self-pay

## 2023-10-07 NOTE — Telephone Encounter (Signed)
    But the 61ml/28ds will go today. The 31ml/84ds will go on 10/09/23 per insurance when I called. Lmom for pt and sent mychart to advise him

## 2023-10-12 DIAGNOSIS — D485 Neoplasm of uncertain behavior of skin: Secondary | ICD-10-CM | POA: Diagnosis not present

## 2023-10-12 DIAGNOSIS — L408 Other psoriasis: Secondary | ICD-10-CM | POA: Diagnosis not present

## 2023-10-12 DIAGNOSIS — M1991 Primary osteoarthritis, unspecified site: Secondary | ICD-10-CM | POA: Diagnosis not present

## 2023-10-12 DIAGNOSIS — R21 Rash and other nonspecific skin eruption: Secondary | ICD-10-CM | POA: Diagnosis not present

## 2023-10-12 DIAGNOSIS — L4059 Other psoriatic arthropathy: Secondary | ICD-10-CM | POA: Diagnosis not present

## 2023-10-12 DIAGNOSIS — L309 Dermatitis, unspecified: Secondary | ICD-10-CM | POA: Diagnosis not present

## 2023-10-12 DIAGNOSIS — M722 Plantar fascial fibromatosis: Secondary | ICD-10-CM | POA: Diagnosis not present

## 2023-11-05 DIAGNOSIS — E291 Testicular hypofunction: Secondary | ICD-10-CM | POA: Diagnosis not present

## 2023-11-08 ENCOUNTER — Ambulatory Visit (INDEPENDENT_AMBULATORY_CARE_PROVIDER_SITE_OTHER)

## 2023-11-08 DIAGNOSIS — Z23 Encounter for immunization: Secondary | ICD-10-CM

## 2023-11-08 NOTE — Progress Notes (Signed)
 Patient is in office today for a nurse visit for [employer]. Patient Injection was given in the  Right deltoid. Patient tolerated injection well.

## 2023-11-25 ENCOUNTER — Encounter: Payer: Self-pay | Admitting: Pediatrics

## 2023-12-21 ENCOUNTER — Ambulatory Visit: Payer: Self-pay

## 2023-12-21 ENCOUNTER — Encounter: Payer: Self-pay | Admitting: Pediatrics

## 2023-12-21 ENCOUNTER — Ambulatory Visit (AMBULATORY_SURGERY_CENTER)

## 2023-12-21 VITALS — Ht 68.5 in | Wt 185.0 lb

## 2023-12-21 DIAGNOSIS — Z860100 Personal history of colon polyps, unspecified: Secondary | ICD-10-CM

## 2023-12-21 MED ORDER — NA SULFATE-K SULFATE-MG SULF 17.5-3.13-1.6 GM/177ML PO SOLN: 1.0000 | Freq: Once | ORAL | 0 refills | Status: AC

## 2023-12-21 NOTE — Progress Notes (Signed)
 No egg or soy allergy known to patient  No issues known to pt with past sedation with any surgeries or procedures Patient denies ever being told they had issues or difficulty with intubation  No FH of Malignant Hyperthermia Pt is not on diet pills Pt is not on  home 02  Pt is not on blood thinners  Pt denies issues with constipation  No A fib or A flutter Have any cardiac testing pending-- no LOA: independent  Prep: suprep   Patient's chart reviewed by Norleen Schillings CNRA prior to previsit and patient appropriate for the [facility].  Previsit completed and red dot placed by patient's name on their procedure day (on provider's schedule).     PV completed with patient. Prep instructions sent via mychart and hard copt given at [facility]

## 2023-12-21 NOTE — Telephone Encounter (Signed)
 FYI Only or Action Required?: FYI only for provider.  Patient was last seen in primary care on 07/28/2023 by Micheal Wolm ORN, MD.  Called Nurse Triage reporting Abdominal Pain.  Symptoms began several weeks ago.  Interventions attempted: OTC medications: advil .  Symptoms are: gradually worsening.  Triage Disposition: See Physician Within 24 Hours  Patient/caregiver understands and will follow disposition?: Yes, will follow disposition  Copied from CRM [id number]. Topic: Clinical - Red Word Triage >> Dec 21, 2023  8:52 AM Frederich PARAS wrote: [facility] that prompted transfer to Nurse Triage: pain PT Calling he had stomach ongoing belly ache,  gastro things going on Reason for Disposition  [1] MODERATE pain (e.g., interferes with normal activities) AND [2] pain comes and goes (cramps) AND [3] present > 24 hours  (Exception: Pain with Vomiting or Diarrhea - see that Guideline.)  Answer Assessment - Initial Assessment Questions 1. LOCATION: Where does it hurt?      Upper, mostly 2. RADIATION: Does the pain shoot anywhere else? (e.g., chest, back)     Stays in the upper abd, sometimes goes to lower R side 3. ONSET: When did the pain begin? (Minutes, hours or days ago)      3 days more intense, but ongoing before that-end of June 4. SUDDEN: Gradual or sudden onset?     gradual 5. PATTERN Does the pain come and go, or is it constant?     intermittent 6. SEVERITY: How bad is the pain?  (e.g., Scale 1-10; mild, moderate, or severe)     moderate 7. RECURRENT SYMPTOM: Have you ever had this type of stomach pain before? If Yes, ask: When was the last time? and What happened that time?      denies 8. CAUSE: What do you think is causing the stomach pain? (e.g., gallstones, recent abdominal surgery)     denies 9. RELIEVING/AGGRAVATING FACTORS: What makes it better or worse? (e.g., antacids, bending or twisting motion, bowel movement)     Food makes it worse,  10. OTHER  SYMPTOMS: Do you have any other symptoms? (e.g., back pain, diarrhea, fever, urination pain, vomiting)       Loose stools on occasion, nausea,  Pt concerned with s/s and having to have a scope while symptomatic, pt advised to call GI and advise them of concerns. Pt scheduled with PCP, states pain ongoing, worse in last 3 days. Worse with food, worse with coffee. Denies regular NSAID use.  Protocols used: Abdominal Pain - Male-A-AH

## 2023-12-22 ENCOUNTER — Encounter: Payer: Self-pay | Admitting: Family Medicine

## 2023-12-22 ENCOUNTER — Ambulatory Visit (INDEPENDENT_AMBULATORY_CARE_PROVIDER_SITE_OTHER): Admitting: Family Medicine

## 2023-12-22 VITALS — BP 100/60 | HR 69 | Temp 97.5°F | Wt 189.6 lb

## 2023-12-22 DIAGNOSIS — R1013 Epigastric pain: Secondary | ICD-10-CM

## 2023-12-22 DIAGNOSIS — K439 Ventral hernia without obstruction or gangrene: Secondary | ICD-10-CM | POA: Diagnosis not present

## 2023-12-22 NOTE — Progress Notes (Signed)
 Established Patient Office Visit  Subjective   Patient ID: Jake [name], male    DOB: [birth year]  Age: 63 y.o. MRN: [id number]  Chief Complaint  Patient presents with   Abdominal Pain   Diarrhea    HPI   Jake is seen today with some recent upper abdominal pain more at epigastric area that is midline.  He has a history of GERD and reported Barrett's esophagus.  Also has history of colon polyps and in process of getting repeat colonoscopy scheduled.  Last EGD 2022.  He will codirector pylori testing negative at that time.  EGD then showed some gastritis changes.  Patient has been using some nonsteroidals occasionally recently.  Occasional alcohol use.  Denies any recent appetite changes.  He had some recent mild weight gain.  No dysphagia.  Pain does not radiate.  No melena.  No nausea or vomiting.  Started over-the-counter omeprazole  20 mg daily just a couple days ago and has not seen much change thus far.  No right upper quadrant pain.  Has had some occasional loose stools recently but apparently had similar in the past.  Has taken over-the-counter Imodium.  No bloody stools.  Past Medical History:  Diagnosis Date   Allergy    Arthritis    Asthma    CAD in native artery    a. nonobstructive by cardiac CTA.   Cataract    Coronary artery disease    GERD (gastroesophageal reflux disease)    Hyperlipidemia    Neuromuscular disorder (HCC)    carpal tunnel bilateral wrists   Pulmonary nodules    Sinus bradycardia    Past Surgical History:  Procedure Laterality Date   COLONOSCOPY     INGUINAL HERNIA REPAIR  2009   LUMBAR LAMINECTOMY  1997   NECK SURGERY  01/2010   c5-7   VASECTOMY      reports that he quit smoking about 31 years ago. His smoking use included cigarettes. He started smoking about 51 years ago. He has a 10 pack-year smoking history. He has never used smokeless tobacco. He reports current alcohol use of about 10.0 standard drinks of alcohol per week. He reports  that he does not use drugs. family history includes Alcohol abuse in his brother and another family member; Asthma in an other family member; Cancer in his mother; Cancer (age of onset: 37) in his father; Colon cancer (age of onset: 53) in his paternal grandmother; Depression in an other family member; Heart disease in his brother and father; Osteoporosis in his mother; Parkinson's disease in his father; Schizophrenia in his brother; Valvular heart disease in an other family member. Allergies  Allergen Reactions   Statins Other (See Comments)    atorvastatin   Muscle aches at 20 mg     Muscle aches at 20 mg  rosuvastatin  calcium   atorvastatin  calcium   simvastatin    Gluten Meal Other (See Comments)    Digestive symptoms   Lanolin Dermatitis    Wool products   Latex Dermatitis, Hives and Other (See Comments)    REACTION: RASH  REACTION: RASH / ITCHING   Lipitor [Atorvastatin ]     Muscle aches at 20 mg     Other Rash    Wool pants caused rash    Zocor  [Simvastatin ]     Muscle aches at 20 mg daily   Crestor  [Rosuvastatin  Calcium ]     Muscle aches    Review of Systems  Constitutional:  Negative for chills, fever and weight loss.  Respiratory:  Negative for shortness of breath.   Cardiovascular:  Negative for chest pain.  Gastrointestinal:  Positive for abdominal pain. Negative for blood in stool, melena, nausea and vomiting.  Neurological:  Negative for dizziness.      Objective:     BP 100/60 (BP Location: Left Arm, Patient Position: Sitting, Cuff Size: Normal)   Pulse 69   Temp (!) 97.5 F (36.4 C) (Oral)   Wt 189 lb 9.6 oz (86 kg)   SpO2 96%   BMI 28.41 kg/m  BP Readings from Last 3 Encounters:  12/22/23 100/60  07/28/23 122/70  04/22/23 130/76   Wt Readings from Last 3 Encounters:  12/22/23 189 lb 9.6 oz (86 kg)  12/21/23 185 lb (83.9 kg)  07/28/23 185 lb 1.6 oz (84 kg)      Physical Exam Vitals reviewed.  Constitutional:      General: He is not in  acute distress.    Appearance: He is not ill-appearing.  Cardiovascular:     Rate and Rhythm: Normal rate and regular rhythm.  Pulmonary:     Effort: Pulmonary effort is normal.     Breath sounds: Normal breath sounds.  Abdominal:     General: Bowel sounds are normal.     Comments: Abdomen is soft and nondistended.  He has fairly large ventral hernia which is soft and nontender.  No reproducible tenderness.  No hepatomegaly or splenomegaly.  No masses palpated other than ventral hernia  Neurological:     Mental Status: He is alert.      No results found for any visits on 12/22/23.    The ASCVD Risk score (Arnett DK, et al., 2019) failed to calculate for the following reasons:   The valid total cholesterol range is 130 to 320 mg/dL    Assessment & Plan:   #1 epigastric abdominal pain for a couple weeks.  Past history of GERD, Barrett's esophagus, and chronic gastritis.  Has not had any recent hematemesis or melena.  No dizziness.  Currently pain is relatively mild.  He has pending follow-up for colonoscopy with GI.  We suggested the following  -Leave off all non-steroidals - Leave off alcohol as much as possible Continue omeprazole  but increase to 40 mg daily- -supplement with Pepcid 20 mg once or twice daily as needed - Watch for any melena or other evidence for bleeding - Be in touch with GI within the next week or 2 if symptoms not improving with the above  #2 ventral hernia.  Currently asymptomatic.  Nontender.  Reviewed signs and symptoms of strangulation.  Wolm Scarlet, MD

## 2023-12-22 NOTE — Patient Instructions (Signed)
 Would increase the Omeprazole  to 40 mg daily  Try to avoid alcohol as much as possible  AVOID all NSAIDS.  Follow up with GI if symptoms  not improving in couple weeks with the Omeprazole .

## 2024-01-03 ENCOUNTER — Encounter: Admitting: Pediatrics

## 2024-01-04 ENCOUNTER — Other Ambulatory Visit: Payer: Self-pay | Admitting: Internal Medicine

## 2024-01-04 DIAGNOSIS — E782 Mixed hyperlipidemia: Secondary | ICD-10-CM

## 2024-01-04 DIAGNOSIS — I2510 Atherosclerotic heart disease of native coronary artery without angina pectoris: Secondary | ICD-10-CM

## 2024-01-04 DIAGNOSIS — I25119 Atherosclerotic heart disease of native coronary artery with unspecified angina pectoris: Secondary | ICD-10-CM

## 2024-01-18 ENCOUNTER — Telehealth: Payer: Self-pay

## 2024-01-18 NOTE — Telephone Encounter (Signed)
 Copied from CRM [id number]. Topic: Clinical - Medication Question >> Jan 18, 2024  8:01 AM Jake [name] wrote: Reason for CRM: Patient would like to know the options for getting the Antimalarial medications prior to going out of the country in mid February 2026.    CB#[phone]

## 2024-01-18 NOTE — Telephone Encounter (Signed)
 Patient informed of the message below.

## 2024-01-18 NOTE — Telephone Encounter (Unsigned)
 Copied from CRM [id number]. Topic: Clinical - Medication Question >> Jan 18, 2024  8:01 AM Suzen RAMAN wrote: Reason for CRM: Patient would like to know the options for getting the Antimalarial medications prior to going out of the country in mid February 2026.    CB#[phone]

## 2024-01-20 DIAGNOSIS — L4059 Other psoriatic arthropathy: Secondary | ICD-10-CM | POA: Diagnosis not present

## 2024-01-20 DIAGNOSIS — R21 Rash and other nonspecific skin eruption: Secondary | ICD-10-CM | POA: Diagnosis not present

## 2024-01-20 DIAGNOSIS — M1991 Primary osteoarthritis, unspecified site: Secondary | ICD-10-CM | POA: Diagnosis not present

## 2024-01-20 DIAGNOSIS — M722 Plantar fascial fibromatosis: Secondary | ICD-10-CM | POA: Diagnosis not present

## 2024-02-01 ENCOUNTER — Telehealth: Payer: Self-pay

## 2024-02-01 NOTE — Telephone Encounter (Signed)
 I spoke with the patient and informed patient that prescription is not required per [facility] and [facility] does not have vaccination in stick at this time. Patient reported he will go to another pharmacy to receive vaccine.

## 2024-02-01 NOTE — Telephone Encounter (Signed)
 Copied from CRM [id number]. Topic: General - Other >> Feb 01, 2024  3:17 PM [name] wrote: Reason for CRM: Patient called in stating that at [facility] they are telling him to he needs a prescription from PCP to get the covid vaccine since he's not 63 years old.Patient would like to know if he could have this prescription written?He would like to know if the prescription could be sent to pharmacy and if he could have written paper prescription for him to pick up in case [facility] doesn't have it in stock?  [facility] [place], [place] - [name]  Phone: [phone] Fax: [phone]

## 2024-02-07 DIAGNOSIS — E291 Testicular hypofunction: Secondary | ICD-10-CM | POA: Diagnosis not present

## 2024-02-10 ENCOUNTER — Other Ambulatory Visit: Payer: Self-pay | Admitting: Family Medicine

## 2024-02-20 NOTE — Progress Notes (Signed)
 " [facility] Initial Consultation   Referring Provider Micheal Wolm ORN, MD [address] [place],  [place] [address]  Primary Care Provider Burchette, Wolm ORN, MD  Patient Profile: Jake [name] is a 63 y.o. male who is seen in consultation in the [facility] at the request of Dr. Micheal for evaluation and management of the problem(s) noted below.  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  History of Present Illness Jake [name] is a 63 year old male with a history of CAD, psoriasis, HLD, allergic rhinitis who returns to the gastroenterology office for follow-up of indigestion, gluten intolerance, change in bowel habits and history of colon polyps  Indigestion and gluten intolerance - Significant indigestion and upper abdominal pressure attributed to hiatal hernia and ventral hernia above the umbilicus - Abdominal pain primarily on the right side, sometimes resembling renal pain - Endorses associated nausea but no vomiting - Denies GERD symptoms including regurgitation, heartburn, pyrosis - Not currently on reflux medications  - Previous abdominal ultrasound, HIDA scan and CT scan negative  - Reports history of a form of gluten intolerance - He reports that previous celiac testing was negative, possibly affected by gluten-free diet at the time of testing - I do not see celiac serologies in the computer - Notes that while he restricts gluten and food he does not avoid gluten and other products such as toothpaste or lip balm - Interested in gluten challenge with follow-up upper endoscopy for investigation of  indigestion and biopsies for celiac disease  Change in bowel habits - Reports change in stool caliber with smaller and narrower stools - Frequent bowel movements, approximately four times daily - History of diarrhea, particularly while on Otezla, which has been discontinued - Occasional hemorrhoids - No blood or mucus in stool  Colonic polyps and colorectal cancer risk - History of colon polyps, including tubular adenomas and sessile serrated polyps, both precancerous - Colonoscopies documented below under review of data - Last colonoscopy in 2020 revealed a subcentimeter sessile serrated polyp-5-year follow-up recommended and is now due - Family history of colon cancer in paternal grandmother, diagnosed in her 90s  Abdominal pain - Chart reports a longstanding history of abdominal pain localized to right upper quadrant - Multiple negative tests -ultrasound, HIDA, CTAP, EGD - Patient queries if pain could be related to renal nodules - No exacerbation of pain at this time  GI Review of Symptoms Significant for indigestion, change in bowel habits. Otherwise negative.  General Review of Systems  Review of systems is significant for the pertinent positives and negatives as listed per the HPI.  Full ROS is otherwise negative.  Past Medical History   Past Medical History:  Diagnosis Date   Allergy    Arthritis    Asthma    CAD in native artery    a. nonobstructive by cardiac CTA.   Cataract    Coronary artery disease    GERD (gastroesophageal reflux disease)    Hyperlipidemia    Neuromuscular disorder (HCC)    carpal tunnel bilateral wrists   Pulmonary nodules    Sinus bradycardia      Past Surgical History   Past Surgical History:  Procedure  Laterality Date   COLONOSCOPY     INGUINAL HERNIA REPAIR  2009   LUMBAR LAMINECTOMY  1997   NECK SURGERY  01/2010   c5-7   VASECTOMY       Allergies and Medications   Allergies  Allergen Reactions   Statins Other (See  Comments)    atorvastatin   Muscle aches at 20 mg     Muscle aches at 20 mg  rosuvastatin  calcium   atorvastatin  calcium   simvastatin    Gluten Meal Other (See Comments)    Digestive symptoms   Lanolin Dermatitis    Wool products   Latex Dermatitis, Hives and Other (See Comments)    REACTION: RASH  REACTION: RASH / ITCHING   Lipitor [Atorvastatin ]     Muscle aches at 20 mg     Other Rash    Wool pants caused rash    Zocor  [Simvastatin ]     Muscle aches at 20 mg daily   Crestor  [Rosuvastatin  Calcium ]     Muscle aches   Current Meds  Medication Sig   albuterol  (VENTOLIN  HFA) 108 (90 Base) MCG/ACT inhaler INHALE 2 PUFFS EVERY 6 HOURS AS NEEDED FOR COUGH, WHEEZE, SHORTNESS OF BREATH.   Alirocumab  (PRALUENT ) 75 MG/ML SOAJ INJECT 1 PEN INTO THE SKIN ONE DAY A WEEK   clotrimazole  (LOTRIMIN ) 1 % cream Apply 1 Application topically as needed.   diclofenac Sodium (VOLTAREN) 1 % GEL APPLY 2 GRAMS TO THE AFFECTED AREA(S) BY TOPICAL ROUTE 4 TIMES PER DAY   EPINEPHRINE  0.3 mg/0.3 mL IJ SOAJ injection Inject 0.3 mg into the muscle as needed for anaphylaxis.   ezetimibe  (ZETIA ) 10 MG tablet Take 1 tablet (10 mg total) by mouth daily.   fluticasone  (FLONASE ) 50 MCG/ACT nasal spray Place 2 sprays into both nostrils as needed for allergies or rhinitis.   Ibuprofen  (ADVIL  PO) Take by mouth as needed.   Loperamide HCl (IMODIUM PO) Take by mouth as needed.   Miconazole POWD Place 1 Application onto the skin as needed (Athletes foot).   Na Sulfate-K Sulfate-Mg Sulfate concentrate (SUPREP) 17.5-3.13-1.6 GM/177ML SOLN Take 1 kit (354 mLs total) by mouth once for 1 dose.   naproxen (NAPROSYN) 500 MG tablet Take 500 mg by mouth 2 (two) times daily with a meal.   omeprazole  (PRILOSEC) 20 MG capsule Take 20 mg by mouth as needed.   tadalafil (CIALIS) 10 MG tablet Take 10 mg by mouth daily as needed.     Family History   Family History  Problem Relation Age of Onset   Cancer Mother        blood  cancer   Osteoporosis Mother    Heart disease Father    Parkinson's disease Father    Cancer Father 42       glioblastoma   Schizophrenia Brother    Heart disease Brother    Alcohol abuse Brother    Colon cancer Paternal Grandmother 67   Alcohol abuse Other    Asthma Other    Depression Other    Valvular heart disease Other    Esophageal cancer Neg Hx    Stomach cancer Neg Hx    Rectal cancer Neg Hx      Social History   Social History   Tobacco Use   Smoking status: Former    Current packs/day: 0.00    Average packs/day: 0.5 packs/day for 20.0 years (10.0 ttl pk-yrs)    Types: Cigarettes    Start date: 06/22/1972    Quit date: 06/22/1992  Years since quitting: 31.6   Smokeless tobacco: Never  Vaping Use   Vaping status: Never Used  Substance Use Topics   Alcohol use: Yes    Alcohol/week: 10.0 standard drinks of alcohol    Types: 10 Standard drinks or equivalent per week   Drug use: No   Damion reports that he quit smoking about 31 years ago. His smoking use included cigarettes. He started smoking about 51 years ago. He has a 10 pack-year smoking history. He has never used smokeless tobacco. He reports current alcohol use of about 10.0 standard drinks of alcohol per week. He reports that he does not use drugs.  Vital Signs and Physical Examination   Vitals:   02/21/24 1353  BP: 136/72  Pulse: 70   Body mass index is 28.1 kg/m. Weight: 190 lb 4 oz (86.3 kg)  General: Well developed, well nourished, no acute distress Head: Normocephalic and atraumatic Eyes: Sclerae anicteric, EOMI Lungs: Clear throughout to auscultation Heart: Regular rate and rhythm; No murmurs, rubs or bruits Abdomen: Soft, non tender and non distended. No masses, hepatosplenomegaly or hernias noted. Normal Bowel sounds Rectal: Deferred Musculoskeletal: Symmetrical with no gross deformities  Neurologic: Possible mild psychomotor slowing noted today unclear if baseline or new   Review  of Data  The following data was reviewed at the time of this encounter:  Laboratory Studies      Latest Ref Rng & Units 07/28/2023    3:04 PM 04/14/2022   11:09 AM 03/18/2021   10:32 AM  CBC  WBC 4.0 - 10.5 K/uL 9.9  9.4  5.4   Hemoglobin 13.0 - 17.0 g/dL 84.9  83.3  86.0   Hematocrit 39.0 - 52.0 % 44.2  48.6  40.5   Platelets 150.0 - 400.0 K/uL 330.0  322.0  265.0     Lab Results  Component Value Date   LIPASE 24 01/18/2019      Latest Ref Rng & Units 07/28/2023    3:04 PM 06/23/2023   10:25 AM 04/14/2022   11:09 AM  CMP  Glucose 70 - 99 mg/dL 893   92   BUN 6 - 23 mg/dL 13   14   Creatinine 9.59 - 1.50 mg/dL 9.04   9.05   Sodium 864 - 145 mEq/L 143   138   Potassium 3.5 - 5.1 mEq/L 3.6   4.9   Chloride 96 - 112 mEq/L 102   100   CO2 19 - 32 mEq/L 32   33   Calcium  8.4 - 10.5 mg/dL 9.1   9.6   Total Protein 6.0 - 8.3 g/dL 6.9   7.2   Total Bilirubin 0.2 - 1.2 mg/dL 0.5   0.8   Alkaline Phos 39 - 117 U/L 62   68   AST 0 - 37 U/L 17   14   ALT 0 - 53 U/L 15  23  15       Imaging Studies  CTAP 05/13/2022 1. No acute findings within the abdomen pelvis. 2. Findings consistent with a minimal right inguinal hernia. 3. Minimal aortic atherosclerosis. Small renal cysts, no follow-up recommended.  HIDA scan 07/2020 Patent biliary tree. Normal gallbladder ejection fraction of 70% following fatty meal stimulation. Patient reported abdominal pain following Ensure ingestion  Abdominal ultrasound 05/2020 Bilateral renal cysts. Otherwise unremarkable abdominal ultrasound.  HIDA scan 10/2014 1. Normal nuclear medicine hepatobiliary scan. 2. Normal gallbladder ejection fraction of 99%.  RUQ US  10/2014 1. Possible gallbladder cholesterolosis. No gallstones noted. No  gallbladder wall thickening or pericholecystic fluid collection. No biliary distention. The patient was tender in the right upper quadrant during ultrasound.   2.  Exam otherwise unremarkable.  GI Procedures  and Studies  EGD 07/2020 LA grade A distal esophagitis Mild nonspecific gastritis Path: Mild gastritis, H. pylori negative  Colonoscopy 12/2018 2 mm sessile serrated polyp -5-year interval recommended  Colonoscopy 2017 For subcentimeter polyps-2 tubular adenomas, 1 sessile serrated polyp  Colonoscopy 2007 1 hyperplastic polyp   Clinical Impression  It is my clinical impression that Jake [name] is a 63 y.o. 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  He endorses symptoms of indigestion and discomfort in his upper abdomen/epigastric region.  Notes that he has been maintained on a gluten-free diet for a form of possible gluten sensitivity/intolerance since 2012.  He believes that previous testing was negative but may have been done after he had already embarked on a gluten-free diet.  At today's visit he is interested in a gluten challenge and reevaluating whether or not he has celiac disease versus nonceliac gluten sensitivity.  We discussed a minimum of 4 weeks of consuming the equivalent of 2 slices of bread containing gluten.  After this we will proceed with EGD with biopsies to evaluate for celiac disease as well as H. pylori.  He is due for a colonoscopy for colon polyp surveillance with a history of sessile serrated polyps and tubular adenomas.  He also endorses a recent change in bowel habits with smaller caliber stool.  He is amenable to having colonoscopy performed at the same time as upper endoscopy.  Plan  Schedule EGD and colonoscopy at [facility] after a minimum 4-week gluten challenge Pending results of EGD and colonoscopy will make a  determination if he needs to resume a gluten-free diet Symptoms of right upper quadrant abdominal pain are currently stable-no further investigation warranted at this time Monitor weight and anthropometrics.  Planned Follow Up 2-3 months  The patient or caregiver verbalized understanding of the material covered, with no barriers to understanding. All questions were answered. Patient or caregiver is agreeable with the plan outlined above.    It was a pleasure to see Jake [name].  If you have any questions or concerns regarding this evaluation, do not hesitate to contact me.  Inocente Hausen, MD [facility]   I spent total of 45 minutes in both face-to-face (25 minutes interview) and non-face-to-face (20 minutes chart review, care coordination, documentation)  activities, excluding procedures performed, for the visit on the date of this encounter.  "

## 2024-02-21 ENCOUNTER — Encounter: Payer: Self-pay | Admitting: Pediatrics

## 2024-02-21 ENCOUNTER — Ambulatory Visit (INDEPENDENT_AMBULATORY_CARE_PROVIDER_SITE_OTHER): Admitting: Pediatrics

## 2024-02-21 VITALS — BP 136/72 | HR 70 | Ht 69.0 in | Wt 190.2 lb

## 2024-02-21 DIAGNOSIS — R194 Change in bowel habit: Secondary | ICD-10-CM

## 2024-02-21 DIAGNOSIS — Z860101 Personal history of adenomatous and serrated colon polyps: Secondary | ICD-10-CM

## 2024-02-21 DIAGNOSIS — Z8719 Personal history of other diseases of the digestive system: Secondary | ICD-10-CM

## 2024-02-21 DIAGNOSIS — K30 Functional dyspepsia: Secondary | ICD-10-CM

## 2024-02-21 DIAGNOSIS — Z860100 Personal history of colon polyps, unspecified: Secondary | ICD-10-CM

## 2024-02-21 MED ORDER — NA SULFATE-K SULFATE-MG SULF 17.5-3.13-1.6 GM/177ML PO SOLN: 1.0000 | Freq: Once | ORAL | 0 refills | Status: AC

## 2024-02-21 NOTE — Patient Instructions (Addendum)
 It is recommended that you do a trial diet that includes Gluten at least 4 weeks prior to your procedure.    You have been scheduled for an endoscopy and colonoscopy. Please follow the written instructions given to you at your visit today.  If you use inhalers (even only as needed), please bring them with you on the day of your procedure.  DO NOT TAKE 7 DAYS PRIOR TO TEST- Trulicity (dulaglutide) Ozempic, Wegovy (semaglutide) Mounjaro (tirzepatide) Bydureon Bcise (exanatide extended release)  DO NOT TAKE 1 DAY PRIOR TO YOUR TEST Rybelsus (semaglutide) Adlyxin (lixisenatide) Victoza (liraglutide) Byetta (exanatide) ___________________________________________________________________________   Follow up in 2-3 months.   Thank you for entrusting me with your care and for choosing [facility], Dr. Inocente Hausen  ____________________________________________________  If your blood pressure at your visit was 140/90 or greater, please contact your primary care physician to follow up on this.  _______________________________________________________  If you are age 65 or older, your body mass index should be between 23-30. Your Body mass index is 28.1 kg/m. If this is out of the aforementioned range listed, please consider follow up with your Primary Care Provider.  If you are age 66 or younger, your body mass index should be between 19-25. Your Body mass index is 28.1 kg/m. If this is out of the aformentioned range listed, please consider follow up with your Primary Care Provider.   ________________________________________________________  The [facility] providers would like to encourage you to use MYCHART to communicate with providers for non-urgent requests or questions.  Due to long hold times on the telephone, sending your provider a message by [facility] may be a faster and more efficient way to get a response.  Please allow 48 business hours for a response.  Please remember  that this is for non-urgent requests.  _______________________________________________________  [facility] is using a team-based approach to care.  Your team is made up of your doctor and two to three APPS. Our APPS (Nurse Practitioners and Physician Assistants) work with your physician to ensure care continuity for you. They are fully qualified to address your health concerns and develop a treatment plan. They communicate directly with your gastroenterologist to care for you. Seeing the Advanced Practice Practitioners on your physician's team can help you by facilitating care more promptly, often allowing for earlier appointments, access to diagnostic testing, procedures, and other specialty referrals.

## 2024-03-16 NOTE — Progress Notes (Signed)
 03/17/2024    Location: [facility] at [place]    Precautions: Gloves were worn during the patient's evaluation. SARS-CoV-2                         precautions were followed.  Provider: Camellia EMERSON Milliner, M.D., M.S., F.A.C.S.  Visit Type:  Return Adult  Encounter Date: March 17, 2024  Patient Name:  Jake [name]     Medical Record Number (MRN): [id number]                          Sex:  male  Date of Birth:  04-29-1961                                Age: 63 y.o.  Phone number(s): [phone] (home)                       [phone] (mobile)  Primary Dr:  Wolm LELON Scarlet, MD  Referring Provider: No ref. provider found  Person(s) accompanying patient: unaccompanied  Interpreter: None    CC: Follow-up fluttering sound, left ear, sensorineural hearing loss each ear  Chief Complaint  Patient presents with  . Follow-up  . Hearing Loss     Patient Active Problem List  Diagnosis  . Asymmetric SNHL (sensorineural hearing loss)  . Sensorineural hearing loss (SNHL) of both ears  . Tinnitus, bilateral  . Stapedial myoclonus      03/13/2022 HPI:    Jake [name] is a 63 y.o. male who reports to the [facility] today with the chief complaint of an intermittent fluttering sound, left ear and bilateral high-frequency hearing losses.    The patient is well-known to me.  This is his first visit to see me at the Otolaryngology Clinic in [place].    The patient was first evaluated by myself on 07/13/2017.  He reported intermittent fluttering sound in his left ear that was caused by certain frequencies.  He did he did not have it all the time.  This sounds seem to come and go.  It was associated with  some minor otalgia.  He had been experiencing a problem for approximately 12 months associated with some itching in his left ear canal.  He denied otorrhea, otalgia or vertigo.    On physical examination he was found to  have clear mobile tympanic membranes with air-containing middle ear spaces bilaterally.  Audiometric testing showed that he was found to have mild to moderate asymmetric high-frequency sensorineural hearing losses,  left ear greater than right ear, with SRT's of 0 DB bilaterally and 92% word recognition bilaterally.    I did not know the etiology of his intermittent left fluttering sound I did remove from debris from the left ear canal.  It was also possible that he was having either stapedius muscle or tensor tympani spasming.  I did not feel that he needed to have  an MRI brain scan at that time.  I did place him on a short course of Flexeril.    He was evaluated again on 08/16/2017 and continued to have a broken speaker type sound in his left ear that was not stimulated by any particular frequency or activity.  He did not describe hyperacusis, recruitment, or mise of hernia.  He complains of a  rash  involving both of his forearms with some intense itching.  He continued to deny otorrhea, otalgia or vertigo.  He was started on a prednisone  Dosepak and clobetasol cream twice daily to both arms for his rash.    He was followed up in 3 weeks.  The Flexeril cause drowsiness did not relieve his symptoms.  However, the fluttering seem to have resolved and he was left with some baseline tinnitus.  His allergic contact dermatitis of both arms had resolved on the oral  prednisone  and clobetasol.    He was placed on gabapentin and seen again on 10/28/2017.  The intermittent fluttering sound in his left ear remains sporadic and was unassociated with any other focal neurologic signs or symptoms.  He had a dome-shaped hearing loss with moderate asymmetric  high-frequency sensorineural hearing loss in the left ear greater than right ear with SRT's of 0 DB right ear, 5 DB left ear, 100% discrimination right ear, 92% discrimination left ear.  I cannot rule out stapedial or tensor tympani spasm or other central  neurologic  pathology.  I recommended an MRI brain scan with without contrast.    He was seen again on 12/23/2017.  His he continued to experience intermittent fluttering sensation in his left ear but reported that it was a nuisance rather than a problem.  The MRI brain scan with and without contrast did not show any retrocochlear lesions  or strokes.  He was recommended for follow-up in 6 months but was then lost to follow-up.    Today, the patient reports that he continues to experience intermittent fluttering sounds in his left ear that are nuisance rather than a problem.  He does not know any triggers for the fluttering.  He is also missing some words during conversation and  tickly with by background noise.  He has been wearing some apple earbuds to attenuate sound when around very loud sounds like concerts.  He denies otorrhea, otalgia, tinnitus or vertigo.     03/18/2023  HPI:  Today, the patient reports that the fluttering sound in his left ear is still present but comes and goes intermittently.  He is occasionally beginning to miss conversations and is now interested in obtaining hearing aid amplification.  He denied any otorrhea or vertigo.  He experiences some occasional pain in his left preauricular area that is random.  He continues to have bilateral tinnitus.  He and his wife are expecting their first grandsons soon.   03/17/2024 HPI:  Today, the patient reports that his fluttering sound and he is very intermittent at this time.  It is currently a nuisance rather than a problem.  He does think that his hearing is decreasing and he wants to think about hearing aid amplification.  He does report some occasional otalgia, left ear, where he thinks his cartilage is attaching to his bony ear canal.    Allergies  Allergen Reactions  . Atorvastatin  Other (See Comments)    Muscle aches at 20 mg  . Latex Rash    REACTION: RASH  . Simvastatin  Other (See Comments)    Muscle aches at 20 mg daily  .  Rosuvastatin  Calcium  Other (See Comments)    Muscle aches  . Gluten Other (See Comments)    Digestive symptoms  . Lanolin (Wool Alcohols) Rash      Current Outpatient Medications on File Prior to Visit  Medication Sig Dispense Refill  . albuterol  HFA (PROVENTIL  HFA;VENTOLIN  HFA;PROAIR  HFA) 90 mcg/actuation inhaler Inhale 2 puffs every  4 (four) hours as needed for shortness of breath or wheezing.    SABRA DIETARY SUPPLEMENT ORAL Take by mouth. Multi-vitamin with calcium  and D, magnesium    . EPINEPHrine  (EPIPEN ) 0.3 mg/0.3 mL injection syringe Inject 0.3 mg into the muscle once as needed for anaphylaxis.    . ezetimibe  (ZETIA ) 10 mg tablet Take 1 tablet by mouth Once Daily.    . ibuprofen  (MOTRIN ) 50 mg/1.25 mL suspension Take 600 mg by mouth every 6 (six) hours as needed.    . Praluent  Pen 75 mg/mL pnij injection Inject 75 mg under the skin every 14 (fourteen) days.    . tadalafiL (CIALIS) 10 mg tablet Take 1 tablet (10 mg total) by mouth daily. And extra pills some days if needed (Maximum 20 mg per day) 135 tablet 3   No current facility-administered medications on file prior to visit.      Past Medical History:  Diagnosis Date  . Arthritis   . Asthma   . ED (erectile dysfunction)   . GERD (gastroesophageal reflux disease)   . Hearing loss   . Hyperlipidemia       Past Surgical History:  Procedure Laterality Date  . ANTERIOR CERVICAL DISCECTOMY W/ FUSION  2011   Procedure: ANTERIOR CERVICAL DISCECTOMY W/ FUSION  . CARPAL TUNNEL RELEASE Bilateral 2020   Procedure: CARPAL TUNNEL RELEASE  . COLONOSCOPY     Procedure: COLONOSCOPY  . INGUINAL HERNIA REPAIR Right 2008   Procedure: INGUINAL HERNIA REPAIR; with mesh  . LUMBAR LAMINECTOMY  1997   Procedure: LUMBAR LAMINECTOMY     No family history on file.       Reports that he has quit smoking. His smoking use included cigarettes. He has a 1.00 pack-year smoking history. He has quit using smokeless tobacco.  His smokeless  tobacco use  included snuff. He reports current alcohol use of about 10.0 standard drinks of alcohol per week.    ROS: A complete ROS was performed with pertinent positives/negatives noted  in the HPI. The remainder of the ROS are negative.    Prior to this encounter, I have reviewed the patient's current medications and allergies, medical/family/and social histories as well as the patient's vital signs as available.     PHYSICAL EXAMINATION:  Vitals:   03/17/24 1013  BP: 134/74  Pulse: 66  Temp: 97.4 F (36.3 C)  SpO2: 97%      General: Well-developed, well-nourished.  No recognizable syndromes or patterns of malformation.  No acute distress.  Awake, alert, coherent, spontaneous and logical.  Speech: Speech is within normal limits.  Communicates without difficulty.  Head: Normocephalic without lesions or trauma  Face: Facial function is intact and symmetric.  Eyes: PERRLA with EOMI.  Ears: Otomicroscopic examination performed with the use of the microscope  Right Auricle: Normal location, size, shape, and angulation and No skin lesions or signs of inflammation or infection  Left Auricle:       Normal location, size, shape, and angulation and No skin lesions or signs of inflammation or infection  Right Mastoid Cavity:   Left Mastoid Cavity:   R External Auditory Canal: Normal canal width and length., No signs of infection., No stenosis., and No masses.  L External Auditory Canal: Normal canal width and length., No signs of infection., No stenosis., and No masses.  R Tympanic Membrane: The tympanic membrane was clear and mobile., There was an air containing middle ear space., No evidence of infection., and No evidence of cholesteatoma.  L  Tympanic Membrane: The tympanic membrane was clear and mobile., There was an air containing middle ear space., No evidence of infection., and No evidence of cholesteatoma.  Tuning Fork Testing:   Nose: Normal external nasal examination.  Oral  Cavity:   Nasopharynx:   Larynx:   Neck:   Chest:   Cardiac:   Neurologic: Oriented x3 , cranial nerves II-XII were intact except for VIIIth nerve(s)  Other:        Procedure - Cerumen Removal from left external auditory canal and hair removal from right external auditory canal    After verbal consent for cerumen removal was obtained from the patient,  the patient was placed in the supine position.  Using the microscope, a curette, and alligator forceps, a small black hair was removed from the medial right external auditory canal.  There was no sign of infection.  Cerumen was removed from the medial posterior left ear canal with a cerumen curette.  Canal had no signs of infection.  Both tympanic membranes were intact and mobile with air-containing middle ear spaces.  I cannot see  any motion of the left tympanic membrane using the microscope.  No evidence of cholesteatoma.  He tolerated the procedure well and left the clinic in stable condition.        Audiometric Testing:    03/13/2022 audiogram - My independent interpretation    My independent interpretation of the audiogram is that the patient has bilateral moderate asymmetric downsloping high-frequency sensorineural hearing losses, left ear greater than right ear, with SRT's of 10 dB bilaterally and 100% discrimination bilaterally.   He had a negative Stenger test at 3000 and 4000 Hz.               03/18/2023  Audiogram:  My independent interpretation of the audiogram is that the patient has bilateral asymmetric high-frequency sensorineural hearing loss is a moderate degree with SRT's of 15 dB right ear and 10 dB left ear with 96% word recognition right ear and 100% word recognition left ear.  His hearing is similar to that tested last year on 03/13/2022.      03/17/2024  Audiogram:  My independent interpretation of the audiogram is that the patient has bilateral asymmetric, left greater than right, moderate downsloping  bilateral sensorineural hearing losses.  He has SRT's of 15 dB bilaterally with 100% discrimination bilaterally both tested at 60 dB HTL with 30 dB of masking respectively.  His hearing is stable as compared to his 03/18/2023 audiogram.      Audiology Consults:      Vestibular Testing:      Speech Therapy/Voice Consults:        Diagnostic Studies -Independent interpretation:        Impression:   63 y.o. male who is here today for evaluation of his hearing and for an intermittent fluttering sound in his left ear.  He reports that the fluttering has no specific triggers and has not responded to gabapentin in the past.  He was unable to take the medication for very long.  He also had failed Flexeril.  He reports that the fluttering is a nuisance rather than a problem.  The fluttering comes and goes and has no trigger other than an occasional sporadic sound.  And, he did not want to proceed with any type of operative intervention such as sectioning of his stapedial tendon or tensor tympani muscle.    On physical examination he was found to have clear and mobile tympanic membranes.  The remainder of his head neck examination was unremarkable.  No evidence of any left canal pathology.    On audiometric testing, the patient has bilateral asymmetric, left greater than right, moderate downsloping bilateral sensorineural hearing losses.  He has SRT's of 15 dB bilaterally with 100% discrimination bilaterally both tested at 60 dB HTL with 30 dB of masking respectively.  His hearing is stable as compared to his 03/18/2023 audiogram.   My working diagnosis is that the patient is experiencing either tensor tympani or stapedial tendon spasms or perhaps both.  He is no evidence of palatal myoclonus.  He has had this for a long time and he feels that it is a nuisance rather than a problem, and, therefore, I have not placed him on  any treatment.  If the problem would become worse, he could undergo a left  exploratory tympanotomy for sectioning of his stapedial tendon and tensor tympani muscle or additional medication.    He understands that he could benefit from binaural hearing aid amplification and is now ready to proceed.   Plan:   --Ambulatory referral to audiology for a hearing aid evaluation with masking  --Protect his ears against loud noise at all times.  He has apple earbuds.  --Patient is to let me know if the left ear fluttering becomes more problematic and he would like to proceed with operative intervention  --Return in to see Dr. Eldora Blanch, [place], [place] in 1-year with an audiogram       1. Sensorineural hearing loss (SNHL) of both ears  Ambulatory referral to Audiology    2. Asymmetric SNHL (sensorineural hearing loss)  Ambulatory referral to Audiology    3. Stapedial myoclonus  Ambulatory referral to Audiology    4. Tinnitus, bilateral  Ambulatory referral to Audiology       Documentation of time spent today with the patient and any additional time spent today on the patient's behalf:  Category Time (minutes)  Time in room:   Time out of room:   Total time with patient:   Additional time (audio, scans, tests, etc.):   Total time spent today:      Voice-to-Text Disclaimer:   The contents of this note were partially generated with the assistance of the voice-to-text dictation system, Dragon Medical One. I attest to the above information and documentation. However, this note has been created using voice recognition software and may have errors that were not dictated and were not seen during editing.  Camellia EMERSON Milliner, M.D., M.S., F.A.C.S. Assistant Professor, Otology/Neurotology Department of Otolaryngology-Head & Neck Surgery [facility]  [place], [place] [address] [place] Phone: [phone] Email: [email or web address]

## 2024-03-17 DIAGNOSIS — H903 Sensorineural hearing loss, bilateral: Secondary | ICD-10-CM | POA: Diagnosis not present

## 2024-03-17 DIAGNOSIS — G253 Myoclonus: Secondary | ICD-10-CM | POA: Diagnosis not present

## 2024-03-17 DIAGNOSIS — H9313 Tinnitus, bilateral: Secondary | ICD-10-CM | POA: Diagnosis not present

## 2024-03-22 DIAGNOSIS — L578 Other skin changes due to chronic exposure to nonionizing radiation: Secondary | ICD-10-CM | POA: Diagnosis not present

## 2024-03-22 DIAGNOSIS — D229 Melanocytic nevi, unspecified: Secondary | ICD-10-CM | POA: Diagnosis not present

## 2024-03-22 DIAGNOSIS — L814 Other melanin hyperpigmentation: Secondary | ICD-10-CM | POA: Diagnosis not present

## 2024-03-22 DIAGNOSIS — L821 Other seborrheic keratosis: Secondary | ICD-10-CM | POA: Diagnosis not present

## 2024-03-22 NOTE — Progress Notes (Unsigned)
 [name] History and Physical   Primary Care Physician:  Jake [name] ORN, MD   Reason for Procedure:  Indigestion, abdominal pain, nausea, history of gluten intolerance, change in bowel habits, history of colon polyps-tubular adenoma and sessile serrated polyp, family history of colorectal cancer-paternal grandmother  Plan:    Upper endoscopy and colonoscopy     HPI: Jake [name] is [name] 63 y.o. male undergoing upper endoscopy for evaluation of indigestion, abdominal pain, nausea, history of gluten intolerance and colonoscopy for change in bowel habits and history of colon polyps.  Jake [name] endorses symptoms of indigestion and discomfort in his upper abdomen/epigastric region.  Notes he has been maintained on [name] gluten-free diet for [name] form of possible gluten sensitivity/intolerance since 2012.  Prior to today's procedure he performed [name] gluten challenge.  He also expresses concern regarding change in bowel habits with thinner caliber stools.  He has had 3 lifetime colonoscopies.  These have shown tubular adenomas and sessile serrated polyps.  Last colonoscopy in 2020 showed [name] diminutive tubular adenoma.  There is [name] reported history of colorectal cancer in his paternal grandmother.   Past Medical History:  Diagnosis Date   Allergy    Arthritis    Asthma    CAD in native artery    [name]. nonobstructive by cardiac CTA.   Cataract    Coronary artery disease    GERD (gastroesophageal reflux disease)    Hyperlipidemia    Neuromuscular disorder (HCC)    carpal tunnel bilateral wrists   Pulmonary nodules    Sinus bradycardia     Past Surgical History:  Procedure Laterality Date   COLONOSCOPY     INGUINAL HERNIA REPAIR  2009   LUMBAR LAMINECTOMY  1997   NECK SURGERY  01/2010   c5-7   VASECTOMY      Prior to Admission medications   Medication Sig Start Date End Date Taking? Authorizing Provider  albuterol  (VENTOLIN  HFA) 108 (90 Base) MCG/ACT inhaler INHALE 2 PUFFS EVERY  6 HOURS AS NEEDED FOR COUGH, WHEEZE, SHORTNESS OF BREATH. 07/28/23   Jake [name], [name] ORN, MD  Alirocumab  (PRALUENT ) 75 MG/ML SOAJ INJECT 1 PEN INTO THE SKIN ONE DAY [name] WEEK 01/04/24   Jake [name], Jake A, MD  clotrimazole  (LOTRIMIN ) 1 % cream Apply 1 Application topically as needed.    [provider]  diclofenac Sodium (VOLTAREN) 1 % GEL APPLY 2 GRAMS TO THE AFFECTED AREA(S) BY TOPICAL ROUTE 4 TIMES PER DAY 12/10/20   [provider]  EPINEPHRINE  0.3 mg/0.3 mL IJ SOAJ injection Inject 0.3 mg into the muscle as needed for anaphylaxis. 02/10/24   Jake [name], [name] ORN, MD  ezetimibe  (ZETIA ) 10 MG tablet Take 1 tablet (10 mg total) by mouth daily. 06/24/23 02/21/24  Jake [name] LABOR, MD  fluticasone  (FLONASE ) 50 MCG/ACT nasal spray Place 2 sprays into both nostrils as needed for allergies or rhinitis.    [provider]  Ibuprofen  (ADVIL  PO) Take by mouth as needed.    [provider]  Loperamide HCl (IMODIUM PO) Take by mouth as needed.    [provider]  Miconazole POWD Place 1 Application onto the skin as needed (Athletes foot). 04/05/23   [provider]  naproxen (NAPROSYN) 500 MG tablet Take 500 mg by mouth 2 (two) times daily with [name] meal. 01/20/24   [provider]  omeprazole  (PRILOSEC) 20 MG capsule Take 20 mg by mouth as needed.    [provider]  tadalafil (CIALIS) 10 MG tablet Take  10 mg by mouth daily as needed.    [provider]    Current Outpatient Medications  Medication Sig Dispense Refill   albuterol  (VENTOLIN  HFA) 108 (90 Base) MCG/ACT inhaler INHALE 2 PUFFS EVERY 6 HOURS AS NEEDED FOR COUGH, WHEEZE, SHORTNESS OF BREATH. 8.5 g 2   Alirocumab  (PRALUENT ) 75 MG/ML SOAJ INJECT 1 PEN INTO THE SKIN ONE DAY [name] WEEK 6 mL 3   clotrimazole  (LOTRIMIN ) 1 % cream Apply 1 Application topically as needed.     diclofenac Sodium (VOLTAREN) 1 % GEL APPLY 2 GRAMS TO THE AFFECTED AREA(S) BY TOPICAL ROUTE 4 TIMES PER DAY      EPINEPHRINE  0.3 mg/0.3 mL IJ SOAJ injection Inject 0.3 mg into the muscle as needed for anaphylaxis. 2 each 2   ezetimibe  (ZETIA ) 10 MG tablet Take 1 tablet (10 mg total) by mouth daily. 90 tablet 3   fluticasone  (FLONASE ) 50 MCG/ACT nasal spray Place 2 sprays into both nostrils as needed for allergies or rhinitis.     Ibuprofen  (ADVIL  PO) Take by mouth as needed.     Loperamide HCl (IMODIUM PO) Take by mouth as needed.     Miconazole POWD Place 1 Application onto the skin as needed (Athletes foot).     naproxen (NAPROSYN) 500 MG tablet Take 500 mg by mouth 2 (two) times daily with [name] meal.     omeprazole  (PRILOSEC) 20 MG capsule Take 20 mg by mouth as needed.     tadalafil (CIALIS) 10 MG tablet Take 10 mg by mouth daily as needed.     No current facility-administered medications for this visit.    Allergies as of 03/23/2024 - Review Complete 02/21/2024  Allergen Reaction Noted   Statins Other (See Comments) 11/21/2013   Gluten meal Other (See Comments) 01/25/2019   Lanolin Dermatitis 03/13/2022   Latex Dermatitis, Hives, and Other (See Comments) 03/01/2007   Lipitor [atorvastatin ]  11/21/2013   Other Rash 08/15/2013   Zocor  [simvastatin ]  11/21/2013   Crestor  [rosuvastatin  calcium ]  01/12/2019    Family History  Problem Relation Age of Onset   Cancer Mother        blood cancer   Osteoporosis Mother    Heart disease Father    Parkinson's disease Father    Cancer Father 1       glioblastoma   Schizophrenia Brother    Heart disease Brother    Alcohol abuse Brother    Colon cancer Paternal Grandmother 41   Alcohol abuse Other    Asthma Other    Depression Other    Valvular heart disease Other    Esophageal cancer Neg Hx    Stomach cancer Neg Hx    Rectal cancer Neg Hx     Social History   Socioeconomic History   Marital status: Married    Spouse name: Jake [name]   Number of children: 4   Years of education: BA   Highest education level: Bachelor's degree  (e.g., BA, AB, BS)  Occupational History   Occupation: [employer]  Tobacco Use   Smoking status: Former    Current packs/day: 0.00    Average packs/day: 0.5 packs/day for 20.0 years (10.0 ttl pk-yrs)    Types: Cigarettes    Start date: 06/22/1972    Quit date: 06/22/1992    Years since quitting: 31.7   Smokeless tobacco: Never  Vaping Use   Vaping status: Never Used  Substance and Sexual Activity   Alcohol use: Yes  Alcohol/week: 10.0 standard drinks of alcohol    Types: 10 Standard drinks or equivalent per week   Drug use: No   Sexual activity: Not on file  Other Topics Concern   Not on file  Social History Narrative   Patient is married Jake [name]) and has four children.   Patient lives at home with his wife and children.   Patient is working  At [employer].   Patient has [name] BA degree.   Patient used both hands.   Patient drinks three cups of coffee daily.   Social Drivers of Corporate investment banker Strain: Low Risk  (07/28/2023)   Overall Financial Resource Strain (CARDIA)    Difficulty of Paying Living Expenses: Not hard at all  Food Insecurity: Low Risk  (03/17/2024)   Received from [facility]   Hunger Vital Sign    Within the past 12 months, you worried that your food would run out before you got money to buy more: Never true    Within the past 12 months, the food you bought just didn't last and you didn't have money to get more. : Never true  Transportation Needs: No Transportation Needs (03/17/2024)   Received from [facility]    In the past 12 months, has lack of reliable transportation kept you from medical appointments, meetings, work or from getting things needed for daily living? : No  Physical Activity: Insufficiently Active (07/28/2023)   Exercise Vital Sign    Days of Exercise per Week: 3 days    Minutes of Exercise per Session: 30 min  Stress: Stress Concern Present (07/28/2023)   [employer] of  Occupational Health - Occupational Stress Questionnaire    Feeling of Stress : To some extent  Social Connections: Socially Integrated (07/28/2023)   Social Connection and Isolation Panel    Frequency of Communication with Friends and Family: More than three times [name] week    Frequency of Social Gatherings with Friends and Family: More than three times [name] week    Attends Religious Services: More than 4 times per year    Active Member of [employer] or Organizations: Yes    Attends Banker Meetings: More than 4 times per year    Marital Status: Married  Catering manager Violence: Unknown (02/26/2022)   Received from [facility]   HITS    Physically Hurt: Not on file    Insult or Talk Down To: Not on file    Threaten Physical Harm: Not on file    Scream or Curse: Not on file    Review of Systems:  All other review of systems negative except as mentioned in the HPI.  Physical Exam: Vital signs There were no vitals taken for this visit.  General:   Alert,  Well-developed, well-nourished, pleasant and cooperative in NAD Airway:  Mallampati  Lungs:  Clear throughout to auscultation.   Heart:  Regular rate and rhythm; no murmurs, clicks, rubs,  or gallops. Abdomen:  Soft, nontender and nondistended. Normal bowel sounds.   Neuro/Psych:  Normal mood and affect. [name] and O x 3  Inocente Hausen, MD [facility]

## 2024-03-23 ENCOUNTER — Encounter: Payer: Self-pay | Admitting: Pediatrics

## 2024-03-23 ENCOUNTER — Ambulatory Visit: Admitting: Pediatrics

## 2024-03-23 VITALS — BP 123/70 | HR 61 | Temp 97.5°F | Resp 13 | Ht 69.0 in | Wt 190.0 lb

## 2024-03-23 DIAGNOSIS — D123 Benign neoplasm of transverse colon: Secondary | ICD-10-CM | POA: Diagnosis not present

## 2024-03-23 DIAGNOSIS — K30 Functional dyspepsia: Secondary | ICD-10-CM

## 2024-03-23 DIAGNOSIS — Z8719 Personal history of other diseases of the digestive system: Secondary | ICD-10-CM

## 2024-03-23 DIAGNOSIS — K209 Esophagitis, unspecified without bleeding: Secondary | ICD-10-CM

## 2024-03-23 DIAGNOSIS — D122 Benign neoplasm of ascending colon: Secondary | ICD-10-CM | POA: Diagnosis not present

## 2024-03-23 DIAGNOSIS — D128 Benign neoplasm of rectum: Secondary | ICD-10-CM | POA: Diagnosis not present

## 2024-03-23 DIAGNOSIS — K635 Polyp of colon: Secondary | ICD-10-CM | POA: Diagnosis not present

## 2024-03-23 DIAGNOSIS — K621 Rectal polyp: Secondary | ICD-10-CM | POA: Diagnosis not present

## 2024-03-23 DIAGNOSIS — K449 Diaphragmatic hernia without obstruction or gangrene: Secondary | ICD-10-CM | POA: Diagnosis not present

## 2024-03-23 DIAGNOSIS — R194 Change in bowel habit: Secondary | ICD-10-CM

## 2024-03-23 DIAGNOSIS — Z860100 Personal history of colon polyps, unspecified: Secondary | ICD-10-CM

## 2024-03-23 DIAGNOSIS — Z1211 Encounter for screening for malignant neoplasm of colon: Secondary | ICD-10-CM

## 2024-03-23 DIAGNOSIS — R112 Nausea with vomiting, unspecified: Secondary | ICD-10-CM

## 2024-03-23 DIAGNOSIS — K648 Other hemorrhoids: Secondary | ICD-10-CM

## 2024-03-23 DIAGNOSIS — Z860101 Personal history of adenomatous and serrated colon polyps: Secondary | ICD-10-CM

## 2024-03-23 MED ORDER — SODIUM CHLORIDE 0.9 % IV SOLN: 500.0000 mL | INTRAVENOUS | Status: DC

## 2024-03-23 MED ORDER — PANTOPRAZOLE SODIUM 40 MG PO TBEC: 40.0000 mg | DELAYED_RELEASE_TABLET | Freq: Two times a day (BID) | ORAL | 1 refills | Status: AC

## 2024-03-23 NOTE — Patient Instructions (Signed)
 Resume previous diet. Continue present medications. Await pathology results.   Take Pantoprazole 40mg  by mouth daily (20-30 minutes before a meal).   YOU HAD AN ENDOSCOPIC PROCEDURE TODAY AT THE [facility]:   Refer to the procedure report that was given to you for any specific questions about what was found during the examination.  If the procedure report does not answer your questions, please call your gastroenterologist to clarify.  If you requested that your care partner not be given the details of your procedure findings, then the procedure report has been included in a sealed envelope for you to review at your convenience later.  YOU SHOULD EXPECT: Some feelings of bloating in the abdomen. Passage of more gas than usual.  Walking can help get rid of the air that was put into your GI tract during the procedure and reduce the bloating. If you had a lower endoscopy (such as a colonoscopy or flexible sigmoidoscopy) you may notice spotting of blood in your stool or on the toilet paper. If you underwent a bowel prep for your procedure, you may not have a normal bowel movement for a few days.  Please Note:  You might notice some irritation and congestion in your nose or some drainage.  This is from the oxygen used during your procedure.  There is no need for concern and it should clear up in a day or so.  SYMPTOMS TO REPORT IMMEDIATELY:  Following lower endoscopy (colonoscopy or flexible sigmoidoscopy):  Excessive amounts of blood in the stool  Significant tenderness or worsening of abdominal pains  Swelling of the abdomen that is new, acute  Fever of 100F or higher  Following upper endoscopy (EGD)  Vomiting of blood or coffee ground material  New chest pain or pain under the shoulder blades  Painful or persistently difficult swallowing  New shortness of breath  Fever of 100F or higher  Black, tarry-looking stools  For urgent or emergent issues, a gastroenterologist can be  reached at any hour by calling [phone]. Do not use MyChart messaging for urgent concerns.    DIET:  We do recommend a small meal at first, but then you may proceed to your regular diet.  Drink plenty of fluids but you should avoid alcoholic beverages for 24 hours.  ACTIVITY:  You should plan to take it easy for the rest of today and you should NOT DRIVE or use heavy machinery until tomorrow (because of the sedation medicines used during the test).    FOLLOW UP: Our staff will call the number listed on your records the next business day following your procedure.  We will call around 7:15- 8:00 am to check on you and address any questions or concerns that you may have regarding the information given to you following your procedure. If we do not reach you, we will leave a message.     If any biopsies were taken you will be contacted by phone or by letter within the next 1-3 weeks.  Please call us  at [phone] if you have not heard about the biopsies in 3 weeks.    SIGNATURES/CONFIDENTIALITY: You and/or your care partner have signed paperwork which will be entered into your electronic medical record.  These signatures attest to the fact that that the information above on your After Visit Summary has been reviewed and is understood.  Full responsibility of the confidentiality of this discharge information lies with you and/or your care-partner.

## 2024-03-23 NOTE — Progress Notes (Signed)
 Called to room to assist during endoscopic procedure.  Patient ID and intended procedure confirmed with present staff. Received instructions for my participation in the procedure from the performing physician.

## 2024-03-23 NOTE — Op Note (Signed)
 [facility] Patient Name: Jake [name] Procedure Date: 03/23/2024 2:14 PM MRN: [id number] Endoscopist: Inocente Hausen , MD, [id number] Age: 63 Referring MD:  Date of Birth: 1961/01/06 Gender: Male Account #: [id number] Procedure:                Upper GI endoscopy Indications:              Abdominal pain, Indigestion, Nausea, Gluten                            intolerance vs. celiac disease - patient completed                            gluten challenge prior to today's procedure and                            reintroduced gluten into this diet. Medicines:                Monitored Anesthesia Care Procedure:                Pre-Anesthesia Assessment:                           - Prior to the procedure, a History and Physical                            was performed, and patient medications and                            allergies were reviewed. The patient's tolerance of                            previous anesthesia was also reviewed. The risks                            and benefits of the procedure and the sedation                            options and risks were discussed with the patient.                            All questions were answered, and informed consent                            was obtained. Prior Anticoagulants: The patient has                            taken no anticoagulant or antiplatelet agents. ASA                            Grade Assessment: II - A patient with mild systemic                            disease. After reviewing the risks and benefits,  the patient was deemed in satisfactory condition to                            undergo the procedure.                           After obtaining informed consent, the endoscope was                            passed under direct vision. Throughout the                            procedure, the patient's blood pressure, pulse, and                            oxygen saturations were  monitored continuously. The                            Olympus Scope [id number] was introduced through the                            mouth, and advanced to the second part of duodenum.                            The upper GI endoscopy was accomplished without                            difficulty. The patient tolerated the procedure                            well. Scope In: Scope Out: Findings:                 The upper third of the esophagus and middle third                            of the esophagus were normal.                           LA Grade A (one or more mucosal breaks less than 5                            mm, not extending between tops of 2 mucosal folds)                            esophagitis was found.                           The gastric body, gastric antrum, cardia (on                            retroflexion) and gastric fundus (on retroflexion)                            were normal. Biopsies were taken  with a cold                            forceps for Helicobacter pylori testing.                           A small hiatal hernia was present.                           The duodenal bulb and second portion of the                            duodenum were normal. Biopsies for histology were                            taken with a cold forceps for evaluation of celiac                            disease. Complications:            No immediate complications. Estimated blood loss:                            Minimal. Estimated Blood Loss:     Estimated blood loss was minimal. Estimated blood                            loss was minimal. Impression:               - Normal esophagus.                           - Normal gastric body, antrum, cardia and gastric                            fundus. Biopsied.                           - Small hiatal hernia.                           - Normal duodenal bulb and second portion of the                            duodenum.  Biopsied. Recommendation:           - Await pathology results.                           - Recommend Pantoprazole 40 mg po daily 20-30                            minutes before a meal given finding of mild                            esophagitis.                           -  Perform a colonoscopy today.                           - The findings and recommendations were discussed                            with the patient's family. Inocente Hausen, MD 03/23/2024 2:57:03 PM This report has been signed electronically.

## 2024-03-23 NOTE — Progress Notes (Signed)
 Sedate, gd SR, tolerated procedure well, VSS, report to RN

## 2024-03-23 NOTE — Op Note (Signed)
 [facility] Patient Name: Jake [name] Procedure Date: 03/23/2024 2:01 PM MRN: [id number] Endoscopist: Inocente Hausen , MD, [id number] Age: 63 Referring MD:  Date of Birth: 1960-06-07 Gender: Male Account #: [id number] Procedure:                Colonoscopy Indications:              High risk colon cancer surveillance: Personal                            history of non-advanced adenoma, High risk colon                            cancer surveillance: Personal history of sessile                            serrated colon polyp (less than 10 mm in size) with                            no dysplasia, Last colonoscopy: August 2020,                            Incidental change in bowel habits noted Medicines:                Monitored Anesthesia Care Procedure:                Pre-Anesthesia Assessment:                           - Prior to the procedure, a History and Physical                            was performed, and patient medications and                            allergies were reviewed. The patient's tolerance of                            previous anesthesia was also reviewed. The risks                            and benefits of the procedure and the sedation                            options and risks were discussed with the patient.                            All questions were answered, and informed consent                            was obtained. Prior Anticoagulants: The patient has                            taken no anticoagulant or antiplatelet agents. ASA  Grade Assessment: II - A patient with mild systemic                            disease. After reviewing the risks and benefits,                            the patient was deemed in satisfactory condition to                            undergo the procedure.                           After obtaining informed consent, the colonoscope                            was passed under direct  vision. Throughout the                            procedure, the patient's blood pressure, pulse, and                            oxygen saturations were monitored continuously. The                            Olympus Scope SN: [id number] was introduced through                            the anus and advanced to the cecum, identified by                            appendiceal orifice and ileocecal valve. The                            colonoscopy was performed without difficulty. The                            patient tolerated the procedure well. The quality                            of the bowel preparation was good. The terminal                            ileum, ileocecal valve, appendiceal orifice, and                            rectum were photographed. Scope In: 2:37:51 PM Scope Out: 2:53:23 PM Scope Withdrawal Time: 0 hours 13 minutes 44 seconds  Total Procedure Duration: 0 hours 15 minutes 32 seconds  Findings:                 The perianal and digital rectal examinations were                            normal. Pertinent negatives include normal  sphincter tone and no palpable rectal lesions.                           A 4 mm polyp was found in the ascending colon. The                            polyp was sessile. The polyp was removed with a                            cold snare. Resection and retrieval were complete.                           Two sessile polyps were found in the rectum and                            transverse colon. The polyps were 3 to 4 mm in                            size. These polyps were removed with a cold biopsy                            forceps. Resection and retrieval were complete.                           Internal hemorrhoids were found during retroflexion. Complications:            No immediate complications. Estimated blood loss:                            Minimal. Estimated Blood Loss:     Estimated blood loss was  minimal. Impression:               - One 4 mm polyp in the ascending colon, removed                            with a cold snare. Resected and retrieved.                           - Two 3 to 4 mm polyps in the rectum and in the                            transverse colon, removed with a cold biopsy                            forceps. Resected and retrieved.                           - Internal hemorrhoids. Recommendation:           - Discharge patient to home (ambulatory).                           - Await pathology results.                           -  Repeat colonoscopy for surveillance based on                            pathology results.                           - The findings and recommendations were discussed                            with the patient's family.                           - Patient has a contact number available for                            emergencies. The signs and symptoms of potential                            delayed complications were discussed with the                            patient. Return to normal activities tomorrow.                            Written discharge instructions were provided to the                            patient. Inocente Hausen, MD 03/23/2024 3:00:48 PM This report has been signed electronically.

## 2024-03-24 ENCOUNTER — Telehealth: Payer: Self-pay

## 2024-03-24 NOTE — Telephone Encounter (Signed)
  Follow up Call-     03/23/2024    1:32 PM  Call back number  Post procedure Call Back phone  # [phone]  Permission to leave phone message Yes     Patient questions:  Do you have a fever, pain , or abdominal swelling? No. Pain Score  0 *  Have you tolerated food without any problems? Yes.    Have you been able to return to your normal activities? Yes.    Do you have any questions about your discharge instructions: Diet   No. Medications  No. Follow up visit  No.  Do you have questions or concerns about your Care? No.  Actions: * If pain score is 4 or above: No action needed, pain <4.

## 2024-03-27 ENCOUNTER — Other Ambulatory Visit (HOSPITAL_COMMUNITY): Payer: Self-pay

## 2024-03-28 ENCOUNTER — Ambulatory Visit: Payer: Self-pay | Admitting: Pediatrics

## 2024-03-28 ENCOUNTER — Encounter: Payer: Self-pay | Admitting: Pediatrics

## 2024-03-28 LAB — SURGICAL PATHOLOGY

## 2024-03-31 DIAGNOSIS — M7661 Achilles tendinitis, right leg: Secondary | ICD-10-CM | POA: Diagnosis not present

## 2024-04-13 DIAGNOSIS — R269 Unspecified abnormalities of gait and mobility: Secondary | ICD-10-CM | POA: Diagnosis not present

## 2024-04-13 DIAGNOSIS — M79604 Pain in right leg: Secondary | ICD-10-CM | POA: Diagnosis not present

## 2024-04-13 DIAGNOSIS — M7661 Achilles tendinitis, right leg: Secondary | ICD-10-CM | POA: Diagnosis not present

## 2024-04-24 ENCOUNTER — Other Ambulatory Visit (HOSPITAL_COMMUNITY): Payer: Self-pay

## 2024-05-02 DIAGNOSIS — M7661 Achilles tendinitis, right leg: Secondary | ICD-10-CM | POA: Diagnosis not present

## 2024-05-02 DIAGNOSIS — M79604 Pain in right leg: Secondary | ICD-10-CM | POA: Diagnosis not present

## 2024-05-02 DIAGNOSIS — R269 Unspecified abnormalities of gait and mobility: Secondary | ICD-10-CM | POA: Diagnosis not present

## 2024-05-08 DIAGNOSIS — M79604 Pain in right leg: Secondary | ICD-10-CM | POA: Diagnosis not present

## 2024-05-08 DIAGNOSIS — R269 Unspecified abnormalities of gait and mobility: Secondary | ICD-10-CM | POA: Diagnosis not present

## 2024-05-08 DIAGNOSIS — M7661 Achilles tendinitis, right leg: Secondary | ICD-10-CM | POA: Diagnosis not present

## 2024-05-15 DIAGNOSIS — Z461 Encounter for fitting and adjustment of hearing aid: Secondary | ICD-10-CM | POA: Diagnosis not present

## 2024-05-15 DIAGNOSIS — Z974 Presence of external hearing-aid: Secondary | ICD-10-CM | POA: Diagnosis not present

## 2024-05-15 DIAGNOSIS — H903 Sensorineural hearing loss, bilateral: Secondary | ICD-10-CM | POA: Diagnosis not present

## 2024-05-16 NOTE — Progress Notes (Unsigned)
 [facility] return Visit   Referring Provider Micheal Wolm ORN, MD [address] [place],  [place] [address]  Primary Care Provider Burchette, Wolm ORN, MD  Patient Profile: Jake [name] is a 63 y.o. male who returns to the [facility]  office for follow-up of the problem(s) noted below.  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  History of Present Illness Jake [name] is a 63 year old male with a history of CAD, psoriasis, HLD, allergic rhinitis who returns to the gastroenterology office for follow-up of indigestion, gluten intolerance, change in bowel habits and history of colon polyps   Current GI Meds  Pantoprazole  40 mg orally daily  Interval History   Indigestion and gluten intolerance - At last visit reported indigestion, abdominal pressure and right-sided abdominal discomfort - Reports history of a form of gluten intolerance - He reports that previous celiac testing was negative, possibly affected by gluten-free diet at the time of testing  - Performed a gluten challenge and patient underwent EGD 03/2024 showing LA grade a esophagitis; negative for H. pylori and celiac disease  - States he has incorporated some gluten back into his diet but does note symptoms of indigestion and discomfort when eating large quantities - Pantoprazole  40 mg daily prescribed for finding of esophagitis on EGD -thinks it may be helping with some symptoms of indigestion  Change in bowel habits - Previously reported smaller caliber stools - Colonoscopy 03/2024 -3 sessile serrated polyps  but no other abnormalities - Feels bowel movements are now regular - Occasional scant rectal bleeding with hemorrhoids  Colonic polyps and colorectal cancer risk - History of colon polyps, including tubular adenomas and sessile serrated polyps, both precancerous - Colonoscopies documented below under review of data - Last colonoscopy 10 2025-3 sessile serrated polyps-3-year recall recommended - Family history of colon cancer in paternal grandmother, diagnosed in her 90s  Abdominal pain - Chart reports a longstanding history of abdominal pain localized to right upper quadrant - Multiple negative tests -ultrasound, HIDA, CTAP, EGD - No exacerbation of pain at this time  Ventral hernia - Has midline ventral hernia above the umbilicus - Causes intermittent discomfort - Is interested in meeting with a surgeon for further discussion about possible repair - States he previously had repair of an inguinal hernia  GI Review of Symptoms Significant for indigestion, Otherwise negative.  General Review of Systems  Review of systems is significant for the pertinent positives and negatives as listed per the HPI.  Full ROS is otherwise negative.  Past Medical History   Past Medical History:  Diagnosis Date   Allergy    Arthritis    Asthma    CAD in native artery    a. nonobstructive by cardiac CTA.   Cataract    Coronary artery disease    GERD (gastroesophageal reflux disease)    Hyperlipidemia    Neuromuscular disorder (HCC)    carpal tunnel bilateral wrists   Pulmonary nodules    Sinus bradycardia      Past Surgical History   Past Surgical History:  Procedure Laterality Date   COLONOSCOPY     INGUINAL HERNIA REPAIR  2009  LUMBAR LAMINECTOMY  1997   NECK SURGERY  01/2010   c5-7   VASECTOMY       Allergies and Medications   Allergies  Allergen Reactions   Statins Other (See Comments)    atorvastatin   Muscle aches at 20 mg     Muscle aches at 20 mg  rosuvastatin   calcium   atorvastatin  calcium   simvastatin    Gluten Meal Other (See Comments)    Digestive symptoms   Lanolin Dermatitis    Wool products   Latex Dermatitis, Hives and Other (See Comments)    REACTION: RASH  REACTION: RASH / ITCHING   Lipitor [Atorvastatin ]     Muscle aches at 20 mg     Other Rash    Wool pants caused rash    Zocor  [Simvastatin ]     Muscle aches at 20 mg daily   Crestor  [Rosuvastatin  Calcium ] Other (See Comments)    Muscle aches   Current Meds  Medication Sig   albuterol  (VENTOLIN  HFA) 108 (90 Base) MCG/ACT inhaler INHALE 2 PUFFS EVERY 6 HOURS AS NEEDED FOR COUGH, WHEEZE, SHORTNESS OF BREATH.   Alirocumab  (PRALUENT ) 75 MG/ML SOAJ INJECT 1 PEN INTO THE SKIN ONE DAY A WEEK   Cholecalciferol (VITAMIN D3) 25 MCG (1000 UT) CAPS 1 capsule.   clotrimazole  (LOTRIMIN ) 1 % cream Apply 1 Application topically as needed.   diclofenac Sodium (VOLTAREN) 1 % GEL APPLY 2 GRAMS TO THE AFFECTED AREA(S) BY TOPICAL ROUTE 4 TIMES PER DAY   EPINEPHRINE  0.3 mg/0.3 mL IJ SOAJ injection Inject 0.3 mg into the muscle as needed for anaphylaxis.   ezetimibe  (ZETIA ) 10 MG tablet Take 1 tablet (10 mg total) by mouth daily.   fluticasone  (FLONASE ) 50 MCG/ACT nasal spray Place 2 sprays into both nostrils as needed for allergies or rhinitis.   Ibuprofen  (ADVIL  PO) Take by mouth as needed.   Loperamide HCl (IMODIUM PO) Take by mouth as needed.   Magnesium 100 MG CAPS 2 tablets with meals Orally Twice a day   Miconazole POWD Place 1 Application onto the skin as needed (Athletes foot).   naproxen (NAPROSYN) 500 MG tablet Take 500 mg by mouth 2 (two) times daily with a meal.   pantoprazole  (PROTONIX ) 40 MG tablet Take 1 tablet (40 mg total) by mouth 2 (two) times daily.   tadalafil (CIALIS) 10 MG tablet Take 10 mg by mouth daily as needed.     Family History   Family History  Problem Relation Age of Onset   Cancer Mother        blood cancer   Osteoporosis Mother    Heart disease Father     Parkinson's disease Father    Cancer Father 63       glioblastoma   Schizophrenia Brother    Heart disease Brother    Alcohol abuse Brother    Colon cancer Paternal Grandmother 20   Alcohol abuse Other    Asthma Other    Depression Other    Valvular heart disease Other    Esophageal cancer Neg Hx    Stomach cancer Neg Hx    Rectal cancer Neg Hx      Social History   Social History   Tobacco Use   Smoking status: Former    Current packs/day: 0.00    Average packs/day: 0.5 packs/day for 20.0 years (10.0 ttl pk-yrs)    Types: Cigarettes    Start date: 06/22/1972    Quit date: 06/22/1992    Years since quitting: 31.9  Smokeless tobacco: Never  Vaping Use   Vaping status: Never Used  Substance Use Topics   Alcohol use: Yes    Alcohol/week: 10.0 standard drinks of alcohol    Types: 10 Standard drinks or equivalent per week   Drug use: No   Jene reports that he quit smoking about 31 years ago. His smoking use included cigarettes. He started smoking about 51 years ago. He has a 10 pack-year smoking history. He has never used smokeless tobacco. He reports current alcohol use of about 10.0 standard drinks of alcohol per week. He reports that he does not use drugs.  Vital Signs and Physical Examination   Vitals:   05/17/24 1339  BP: 114/68  Pulse: 69    Body mass index is 28.21 kg/m. Weight: 191 lb (86.6 kg)  General: Well developed, well nourished, no acute distress Head: Normocephalic and atraumatic Eyes: Sclerae anicteric, EOMI Lungs: Clear throughout to auscultation Heart: Regular rate and rhythm; No murmurs, rubs or bruits Abdomen: Soft, non tender and non distended. No masses, hepatosplenomegaly.  Reducible ventral hernia above the umbilicus. Normal Bowel sounds Rectal: Deferred Musculoskeletal: Symmetrical with no gross deformities  Neurologic: Possible mild psychomotor slowing noted today unclear if baseline or new   Review of Data  The following data  was reviewed at the time of this encounter:  Laboratory Studies      Latest Ref Rng & Units 07/28/2023    3:04 PM 04/14/2022   11:09 AM 03/18/2021   10:32 AM  CBC  WBC 4.0 - 10.5 K/uL 9.9  9.4  5.4   Hemoglobin 13.0 - 17.0 g/dL 84.9  83.3  86.0   Hematocrit 39.0 - 52.0 % 44.2  48.6  40.5   Platelets 150.0 - 400.0 K/uL 330.0  322.0  265.0     Lab Results  Component Value Date   LIPASE 24 01/18/2019      Latest Ref Rng & Units 07/28/2023    3:04 PM 06/23/2023   10:25 AM 04/14/2022   11:09 AM  CMP  Glucose 70 - 99 mg/dL 893   92   BUN 6 - 23 mg/dL 13   14   Creatinine 9.59 - 1.50 mg/dL 9.04   9.05   Sodium 864 - 145 mEq/L 143   138   Potassium 3.5 - 5.1 mEq/L 3.6   4.9   Chloride 96 - 112 mEq/L 102   100   CO2 19 - 32 mEq/L 32   33   Calcium  8.4 - 10.5 mg/dL 9.1   9.6   Total Protein 6.0 - 8.3 g/dL 6.9   7.2   Total Bilirubin 0.2 - 1.2 mg/dL 0.5   0.8   Alkaline Phos 39 - 117 U/L 62   68   AST 0 - 37 U/L 17   14   ALT 0 - 53 U/L 15  23  15       Imaging Studies  CTAP 05/13/2022 1. No acute findings within the abdomen pelvis. 2. Findings consistent with a minimal right inguinal hernia. 3. Minimal aortic atherosclerosis. Small renal cysts, no follow-up recommended.  HIDA scan 07/2020 Patent biliary tree. Normal gallbladder ejection fraction of 70% following fatty meal stimulation. Patient reported abdominal pain following Ensure ingestion  Abdominal ultrasound 05/2020 Bilateral renal cysts. Otherwise unremarkable abdominal ultrasound.  HIDA scan 10/2014 1. Normal nuclear medicine hepatobiliary scan. 2. Normal gallbladder ejection fraction of 99%.  RUQ US  10/2014 1. Possible gallbladder cholesterolosis. No gallstones noted. No gallbladder wall thickening  or pericholecystic fluid collection. No biliary distention. The patient was tender in the right upper quadrant during ultrasound.   2.  Exam otherwise unremarkable.  GI Procedures and Studies  EGD  03/23/2024 LA grade A esophagitis, small hiatal hernia Path: Normal gastric and duodenal mucosa  Colonoscopy 03/23/2024 3 polyps, internal hemorrhoids Path: Sessile serrated polyp - 3 years recall  EGD 07/2020 LA grade A distal esophagitis Mild nonspecific gastritis Path: Mild gastritis, H. pylori negative  Colonoscopy 12/2018 2 mm sessile serrated polyp -5-year interval recommended  Colonoscopy 2017 For subcentimeter polyps-2 tubular adenomas, 1 sessile serrated polyp  Colonoscopy 2007 1 hyperplastic polyp   Clinical Impression  It is my clinical impression that Mr. Lumpkin is a 63 y.o. male with;  Indigestion LA grade A esophagitis Change in bowel habits History of colon polyps, tubular adenomas and sessile serrated polyps History of esophageal stenosis and Schatzki's ring Gluten intolerance Abdominal pain Ventral hernia  Mr. Steffler returns to the office today for follow-up of multiple gastrointestinal issues as outlined above.  Since his last visit he underwent EGD and colonoscopy 03/2024 which showed LA grade A esophagitis and hiatal hernia but otherwise normal EGD.  Colonoscopy disclosed 3 sessile serrated polyps but no other abnormalities to account for his report of smaller caliber stool.  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  He has reintroduced some gluten into his diet and has noted recrudescence of indigestion and bloating.  Discussed that this comes down to quality of life and he can modulate his diet around symptoms.  He will continue on pantoprazole  for the finding of esophagitis on EGD as he is also finding it helpful for indigestion.  At next visit we can discuss reducing or discontinuing PPI.  Colonoscopy disclosed 3 sessile serrated polyps.  Reviewed that he will be due for his next colonoscopy in 3 years.  He is concerned regarding his ventral hernia  that is now causing more abdominal discomfort.  No history of strangulated hernia.  He is interested in visiting with a surgeon to discuss surgical options.  A referral will be placed.  Plan  He will adjust level of gluten in his diet depending upon symptoms and comfort level Continue on pantoprazole  40 mg orally daily-discuss reducing or discontinuing at next visit; could consider trial of H2 blocker Referral to [facility] surgery for evaluation of ventral hernia Symptoms of right upper quadrant abdominal pain are currently stable-no further investigation warranted at this time Next surveillance colonoscopy due 03/2027 Monitor weight and anthropometrics.  Planned Follow Up 6 months  The patient or caregiver verbalized understanding of the material covered, with no barriers to understanding. All questions were answered. Patient or caregiver is agreeable with the plan outlined above.    It was a pleasure to see Seraphim.  If you have any questions or concerns regarding this evaluation, do not hesitate to contact me.  Inocente Hausen, MD [facility]   I spent total of 30 minutes in both face-to-face (15 minutes interview) and non-face-to-face (15 minutes chart review, care coordination, documentation)  activities, excluding procedures performed, for the visit on the date of this encounter.

## 2024-05-17 ENCOUNTER — Encounter: Payer: Self-pay | Admitting: Pediatrics

## 2024-05-17 ENCOUNTER — Ambulatory Visit: Admitting: Pediatrics

## 2024-05-17 VITALS — BP 114/68 | HR 69 | Ht 69.0 in | Wt 191.0 lb

## 2024-05-17 DIAGNOSIS — Z860100 Personal history of colon polyps, unspecified: Secondary | ICD-10-CM

## 2024-05-17 DIAGNOSIS — K209 Esophagitis, unspecified without bleeding: Secondary | ICD-10-CM | POA: Diagnosis not present

## 2024-05-17 DIAGNOSIS — K9041 Non-celiac gluten sensitivity: Secondary | ICD-10-CM

## 2024-05-17 DIAGNOSIS — K439 Ventral hernia without obstruction or gangrene: Secondary | ICD-10-CM

## 2024-05-17 DIAGNOSIS — K30 Functional dyspepsia: Secondary | ICD-10-CM | POA: Diagnosis not present

## 2024-05-17 NOTE — Patient Instructions (Signed)
 We are referring you to [facility].  They will contact you directly to schedule an appointment.  It may take a week or more before you hear from them.  Please feel free to contact us  if you have not heard from them within 2 weeks and we will follow up on the referral.    Follow up in 6 months.  Thank you for entrusting me with your care and for choosing [facility], Dr. Inocente Hausen  _______________________________________________________  If your blood pressure at your visit was 140/90 or greater, please contact your primary care physician to follow up on this.  _______________________________________________________  If you are age 98 or older, your body mass index should be between 23-30. Your Body mass index is 28.21 kg/m. If this is out of the aforementioned range listed, please consider follow up with your Primary Care Provider.  If you are age 34 or younger, your body mass index should be between 19-25. Your Body mass index is 28.21 kg/m. If this is out of the aformentioned range listed, please consider follow up with your Primary Care Provider.   ________________________________________________________  The [facility] providers would like to encourage you to use MYCHART to communicate with providers for non-urgent requests or questions.  Due to long hold times on the telephone, sending your provider a message by [facility] may be a faster and more efficient way to get a response.  Please allow 48 business hours for a response.  Please remember that this is for non-urgent requests.  _______________________________________________________  [facility] is using a team-based approach to care.  Your team is made up of your doctor and two to three APPS. Our APPS (Nurse Practitioners and Physician Assistants) work with your physician to ensure care continuity for you. They are fully qualified to address your health concerns and develop a treatment plan. They  communicate directly with your gastroenterologist to care for you. Seeing the Advanced Practice Practitioners on your physician's team can help you by facilitating care more promptly, often allowing for earlier appointments, access to diagnostic testing, procedures, and other specialty referrals.

## 2024-06-09 ENCOUNTER — Telehealth: Payer: Self-pay

## 2024-06-09 NOTE — Telephone Encounter (Signed)
 Copied from CRM #[id number]. Topic: Clinical - Medical Advice >> Jun 09, 2024  3:36 PM Laymon HERO wrote: Reason for CRM: patient thinks he has an allergy to eggs- wanting to know where he can get an allergy test done. Please contact patient

## 2024-06-12 NOTE — Telephone Encounter (Signed)
 Patient informed of the message below and voiced understanding

## 2024-06-19 ENCOUNTER — Other Ambulatory Visit (HOSPITAL_COMMUNITY): Payer: Self-pay

## 2024-06-19 ENCOUNTER — Telehealth: Payer: Self-pay | Admitting: Pharmacy Technician

## 2024-06-19 NOTE — Telephone Encounter (Signed)
 Pharmacy Patient Advocate Encounter   Received notification from Fax that prior authorization for praluent  is required/requested.   Insurance verification completed.   The patient is insured through [facility].   Per test claim: PA required; PA submitted to above mentioned insurance via Latent Key/confirmation #/EOC [id number] Status is pending

## 2024-06-22 NOTE — Telephone Encounter (Signed)
 Pharmacy Patient Advocate Encounter  Received notification from [facility] that Prior Authorization for Praluent  has been APPROVED from 06/22/24 to 06/19/25   PA #/Case ID/Reference #: [id number]

## 2024-07-07 ENCOUNTER — Other Ambulatory Visit: Payer: Self-pay | Admitting: Internal Medicine

## 2024-07-07 DIAGNOSIS — E782 Mixed hyperlipidemia: Secondary | ICD-10-CM
